# Patient Record
Sex: Male | Born: 1940 | Race: White | Hispanic: No | Smoking: Current every day smoker
Health system: Southern US, Community
[De-identification: ages and names within clinical notes are randomized; demographics above are authoritative.]

## PROBLEM LIST (undated history)

## (undated) DIAGNOSIS — M1990 Unspecified osteoarthritis, unspecified site: Secondary | ICD-10-CM

## (undated) DIAGNOSIS — F418 Other specified anxiety disorders: Secondary | ICD-10-CM

## (undated) DIAGNOSIS — R7303 Prediabetes: Secondary | ICD-10-CM

## (undated) DIAGNOSIS — N529 Male erectile dysfunction, unspecified: Secondary | ICD-10-CM

## (undated) DIAGNOSIS — Z720 Tobacco use: Secondary | ICD-10-CM

## (undated) DIAGNOSIS — L409 Psoriasis, unspecified: Secondary | ICD-10-CM

## (undated) DIAGNOSIS — M51369 Other intervertebral disc degeneration, lumbar region without mention of lumbar back pain or lower extremity pain: Secondary | ICD-10-CM

## (undated) DIAGNOSIS — E785 Hyperlipidemia, unspecified: Secondary | ICD-10-CM

## (undated) DIAGNOSIS — R0600 Dyspnea, unspecified: Secondary | ICD-10-CM

## (undated) DIAGNOSIS — J449 Chronic obstructive pulmonary disease, unspecified: Secondary | ICD-10-CM

## (undated) DIAGNOSIS — K2270 Barrett's esophagus without dysplasia: Secondary | ICD-10-CM

## (undated) DIAGNOSIS — I420 Dilated cardiomyopathy: Secondary | ICD-10-CM

## (undated) DIAGNOSIS — R0602 Shortness of breath: Secondary | ICD-10-CM

## (undated) DIAGNOSIS — F419 Anxiety disorder, unspecified: Secondary | ICD-10-CM

## (undated) DIAGNOSIS — R0609 Other forms of dyspnea: Secondary | ICD-10-CM

## (undated) DIAGNOSIS — K469 Unspecified abdominal hernia without obstruction or gangrene: Secondary | ICD-10-CM

## (undated) DIAGNOSIS — F32A Depression, unspecified: Secondary | ICD-10-CM

## (undated) DIAGNOSIS — I509 Heart failure, unspecified: Secondary | ICD-10-CM

## (undated) DIAGNOSIS — J431 Panlobular emphysema: Secondary | ICD-10-CM

## (undated) DIAGNOSIS — I5020 Unspecified systolic (congestive) heart failure: Secondary | ICD-10-CM

## (undated) DIAGNOSIS — K219 Gastro-esophageal reflux disease without esophagitis: Secondary | ICD-10-CM

## (undated) DIAGNOSIS — M5136 Other intervertebral disc degeneration, lumbar region: Secondary | ICD-10-CM

## (undated) DIAGNOSIS — F329 Major depressive disorder, single episode, unspecified: Secondary | ICD-10-CM

## (undated) DIAGNOSIS — I429 Cardiomyopathy, unspecified: Secondary | ICD-10-CM

## (undated) DIAGNOSIS — I10 Essential (primary) hypertension: Secondary | ICD-10-CM

## (undated) DIAGNOSIS — N400 Enlarged prostate without lower urinary tract symptoms: Secondary | ICD-10-CM

## (undated) HISTORY — PX: APPENDECTOMY: SHX54

## (undated) HISTORY — PX: CT RADIATION THERAPY GUIDE: HXRAD513

## (undated) HISTORY — DX: Anxiety disorder, unspecified: F41.9

## (undated) HISTORY — PX: JOINT REPLACEMENT: SHX530

## (undated) HISTORY — PX: HERNIA REPAIR: SHX51

## (undated) HISTORY — PX: COLONOSCOPY, ESOPHAGOGASTRODUODENOSCOPY (EGD) AND ESOPHAGEAL DILATION: SHX5781

## (undated) HISTORY — PX: OTHER SURGICAL HISTORY: SHX169

---

## 2006-01-04 ENCOUNTER — Ambulatory Visit: Payer: Self-pay | Admitting: Gastroenterology

## 2007-01-11 ENCOUNTER — Ambulatory Visit: Payer: Self-pay | Admitting: Gastroenterology

## 2008-04-11 ENCOUNTER — Ambulatory Visit: Payer: Self-pay | Admitting: Gastroenterology

## 2010-06-16 ENCOUNTER — Ambulatory Visit: Payer: Self-pay | Admitting: Gastroenterology

## 2014-07-02 DIAGNOSIS — Z87898 Personal history of other specified conditions: Secondary | ICD-10-CM | POA: Insufficient documentation

## 2014-08-15 ENCOUNTER — Ambulatory Visit: Payer: Self-pay | Admitting: Gastroenterology

## 2014-08-16 LAB — PATHOLOGY REPORT

## 2014-09-12 ENCOUNTER — Ambulatory Visit: Payer: Self-pay | Admitting: Gastroenterology

## 2014-11-14 ENCOUNTER — Ambulatory Visit: Payer: Self-pay | Admitting: Gastroenterology

## 2015-01-23 ENCOUNTER — Ambulatory Visit: Payer: Self-pay | Admitting: Gastroenterology

## 2015-03-20 ENCOUNTER — Ambulatory Visit: Payer: Medicare Other | Admitting: Anesthesiology

## 2015-03-20 ENCOUNTER — Ambulatory Visit
Admission: RE | Admit: 2015-03-20 | Discharge: 2015-03-20 | Disposition: A | Discharge status: Home / Self Care | Payer: Medicare Other | Source: Ambulatory Visit | Attending: Gastroenterology | Admitting: Gastroenterology

## 2015-03-20 ENCOUNTER — Encounter
Admission: RE | Disposition: A | Discharge status: Home / Self Care | Payer: Self-pay | Source: Ambulatory Visit | Attending: Gastroenterology

## 2015-03-20 DIAGNOSIS — J449 Chronic obstructive pulmonary disease, unspecified: Secondary | ICD-10-CM | POA: Diagnosis not present

## 2015-03-20 DIAGNOSIS — F17210 Nicotine dependence, cigarettes, uncomplicated: Secondary | ICD-10-CM | POA: Diagnosis not present

## 2015-03-20 DIAGNOSIS — Z79899 Other long term (current) drug therapy: Secondary | ICD-10-CM | POA: Diagnosis not present

## 2015-03-20 DIAGNOSIS — K2270 Barrett's esophagus without dysplasia: Secondary | ICD-10-CM | POA: Diagnosis present

## 2015-03-20 DIAGNOSIS — Z7951 Long term (current) use of inhaled steroids: Secondary | ICD-10-CM | POA: Diagnosis not present

## 2015-03-20 DIAGNOSIS — I10 Essential (primary) hypertension: Secondary | ICD-10-CM | POA: Diagnosis not present

## 2015-03-20 HISTORY — DX: Essential (primary) hypertension: I10

## 2015-03-20 HISTORY — DX: Chronic obstructive pulmonary disease, unspecified: J44.9

## 2015-03-20 HISTORY — PX: ESOPHAGOGASTRODUODENOSCOPY: SHX5428

## 2015-03-20 SURGERY — EGD (ESOPHAGOGASTRODUODENOSCOPY)
Anesthesia: General

## 2015-03-20 MED ORDER — GLYCOPYRROLATE 0.2 MG/ML IJ SOLN
INTRAMUSCULAR | Status: DC | PRN
  Administered 2015-03-20: 0.2 mg via INTRAVENOUS

## 2015-03-20 MED ORDER — PROPOFOL INFUSION 10 MG/ML OPTIME
INTRAVENOUS | Status: DC | PRN
  Administered 2015-03-20: 250 ug/kg/min via INTRAVENOUS

## 2015-03-20 MED ORDER — PROPOFOL 10 MG/ML IV BOLUS
INTRAVENOUS | Status: DC | PRN
  Administered 2015-03-20: 30 mg via INTRAVENOUS
  Administered 2015-03-20: 50 mg via INTRAVENOUS

## 2015-03-20 MED ORDER — SODIUM CHLORIDE 0.9 % IV SOLN
INTRAVENOUS | Status: DC
  Administered 2015-03-20: 1000 mL via INTRAVENOUS

## 2015-03-20 MED ORDER — LIDOCAINE HCL (CARDIAC) 20 MG/ML IV SOLN
INTRAVENOUS | Status: DC | PRN
  Administered 2015-03-20: 60 mg via INTRAVENOUS

## 2015-03-20 MED ORDER — ACETYLCYSTEINE 20 % IN SOLN
3.0000 mL | Freq: Once | RESPIRATORY_TRACT | Status: DC
  Filled 2015-03-20: qty 4

## 2015-03-20 MED ORDER — STERILE WATER FOR IRRIGATION IR SOLN
Freq: Once | RESPIRATORY_TRACT | Status: AC
  Administered 2015-03-20: 08:00:00
  Filled 2015-03-20: qty 57

## 2015-03-20 NOTE — H&P (Addendum)
Primary Care Physician:  Idelle Crouch, MD Primary Gastroenterologist:  Dr. Candace Cruise  Pre-Procedure History & Physical: HPI:  Mathew Reyes is a 74 y.o. male is here for an EGD with bx. Hx of long segment Barrett's. Barrx x 3.   Past Medical History  Diagnosis Date  . COPD (chronic obstructive pulmonary disease)   . Hypertension     Past Surgical History  Procedure Laterality Date  . Appendectomy    . Hernia repair      Prior to Admission medications   Medication Sig Start Date End Date Taking? Authorizing Provider  budesonide-formoterol (SYMBICORT) 160-4.5 MCG/ACT inhaler Inhale 2 puffs into the lungs 2 (two) times daily.   Yes Historical Provider, MD  carvedilol (COREG) 3.125 MG tablet Take 3.125 mg by mouth 2 (two) times daily with a meal.   Yes Historical Provider, MD  digoxin (LANOXIN) 0.125 MG tablet Take 125 mcg by mouth daily. 02/12/15  Yes Historical Provider, MD  esomeprazole (NEXIUM) 20 MG capsule Take 20 mg by mouth daily at 12 noon.   Yes Historical Provider, MD  FLUoxetine (PROZAC) 10 MG capsule Take 10 mg by mouth daily.   Yes Historical Provider, MD  lisinopril (PRINIVIL,ZESTRIL) 5 MG tablet Take 5 mg by mouth daily.   Yes Historical Provider, MD  Multiple Vitamins-Minerals (MULTIVITAMIN & MINERAL PO) Take 1 tablet by mouth daily.   Yes Historical Provider, MD  Omega-3 Fatty Acids (FISH OIL PO) Take 1 tablet by mouth daily.   Yes Historical Provider, MD  POTASSIUM PO Take 1 tablet by mouth daily.   Yes Historical Provider, MD  sildenafil (VIAGRA) 100 MG tablet Take 100 mg by mouth daily as needed for erectile dysfunction.   Yes Historical Provider, MD  TACLONEX external suspension Apply 1 application topically daily. 02/24/15  Yes Historical Provider, MD  XOPENEX HFA 45 MCG/ACT inhaler Inhale 1-2 puffs into the lungs every 6 (six) hours as needed for wheezing or shortness of breath.  12/17/14  Yes Historical Provider, MD    Allergies as of 03/07/2015  . (Not on  File)    History reviewed. No pertinent family history.  History   Social History  . Marital Status: Married    Spouse Name: N/A  . Number of Children: N/A  . Years of Education: N/A   Occupational History  . Not on file.   Social History Main Topics  . Smoking status: Current Every Day Smoker -- 1.50 packs/day    Types: Cigarettes  . Smokeless tobacco: Not on file  . Alcohol Use: Not on file  . Drug Use: Not on file  . Sexual Activity: Not on file   Other Topics Concern  . Not on file   Social History Narrative  . No narrative on file    Review of Systems: See HPI, otherwise negative ROS  Physical Exam: BP 155/91 mmHg  Pulse 61  Temp(Src) 96.9 F (36.1 C) (Tympanic)  Resp 18  Ht 5\' 7"  (1.702 m)  Wt 75.297 kg (166 lb)  BMI 25.99 kg/m2  SpO2 96% General:   Alert,  pleasant and cooperative in NAD Head:  Normocephalic and atraumatic. Neck:  Supple; no masses or thyromegaly. Lungs:  Clear throughout to auscultation.    Heart:  Regular rate and rhythm. Abdomen:  Soft, nontender and nondistended. Normal bowel sounds, without guarding, and without rebound.   Neurologic:  Alert and  oriented x4;  grossly normal neurologically.  Impression/Plan: Mathew Reyes is here for an endoscopy  to be performed for EGD with bx.  Risks, benefits, limitations, and alternatives regarding  endoscopy have been reviewed with the patient.  Questions have been answered.  All parties agreeable.   Mathew Reyes, Mathew Dawn, MD  03/20/2015, 8:00 AM

## 2015-03-20 NOTE — Anesthesia Procedure Notes (Signed)
Performed by: Aline Brochure Oxygen Delivery Method: Nasal cannula

## 2015-03-20 NOTE — Anesthesia Preprocedure Evaluation (Signed)
Anesthesia Evaluation  Patient identified by MRN, date of birth, ID band Patient awake    Reviewed: Allergy & Precautions, H&P , NPO status , Patient's Chart, lab work & pertinent test results, reviewed documented beta blocker date and time   Airway Mallampati: II  TM Distance: >3 FB Neck ROM: full    Dental no notable dental hx.    Pulmonary neg pulmonary ROS, COPDCurrent Smoker,  breath sounds clear to auscultation  Pulmonary exam normal       Cardiovascular Exercise Tolerance: Good hypertension, negative cardio ROS  Rhythm:regular Rate:Normal     Neuro/Psych negative neurological ROS  negative psych ROS   GI/Hepatic negative GI ROS, Neg liver ROS,   Endo/Other  negative endocrine ROS  Renal/GU negative Renal ROS  negative genitourinary   Musculoskeletal   Abdominal   Peds  Hematology negative hematology ROS (+)   Anesthesia Other Findings   Reproductive/Obstetrics negative OB ROS                             Anesthesia Physical Anesthesia Plan  ASA: II  Anesthesia Plan: General   Post-op Pain Management:    Induction:   Airway Management Planned:   Additional Equipment:   Intra-op Plan:   Post-operative Plan:   Informed Consent: I have reviewed the patients History and Physical, chart, labs and discussed the procedure including the risks, benefits and alternatives for the proposed anesthesia with the patient or authorized representative who has indicated his/her understanding and acceptance.   Dental Advisory Given  Plan Discussed with: CRNA  Anesthesia Plan Comments:         Anesthesia Quick Evaluation

## 2015-03-20 NOTE — Anesthesia Postprocedure Evaluation (Signed)
  Anesthesia Post-op Note  Patient: Mathew Reyes  Procedure(s) Performed: Procedure(s): ESOPHAGOGASTRODUODENOSCOPY (EGD) (N/A)  Anesthesia type:General  Patient location: PACU  Post pain: Pain level controlled  Post assessment: Post-op Vital signs reviewed, Patient's Cardiovascular Status Stable, Respiratory Function Stable, Patent Airway and No signs of Nausea or vomiting  Post vital signs: Reviewed and stable  Last Vitals:  Filed Vitals:   03/20/15 0930  BP: 146/89  Pulse: 73  Temp:   Resp: 20    Level of consciousness: awake, alert  and patient cooperative  Complications: No apparent anesthesia complications

## 2015-03-20 NOTE — Transfer of Care (Signed)
Immediate Anesthesia Transfer of Care Note  Patient: LAWYER WASH[facility]AU[facility]  Procedure(s) Performed: Procedure(s): ESOPHAGOGASTRODUODENOSCOPY (EGD) (N/A)  Patient Location: Endoscopy Unit  Anesthesia Type:General  Level of Consciousness: awake and alert   Airway & Oxygen Therapy: Patient Spontanous Breathing and Patient connected to nasal cannula oxygen  Post-op Assessment: Report given to RN and Post -op Vital signs reviewed and stable  Post vital signs: stable  Last Vitals:  Filed Vitals:   03/20/15 0900  BP: 122/71  Pulse: 65  Temp: 36.2 C  Resp: 20    Complications: No apparent anesthesia complications

## 2015-03-20 NOTE — Op Note (Signed)
[facility] Patient Name: Mathew Reyes Procedure Date: 03/20/2015 7:50 AM MRN: [id number] Account #: [id number] Date of Birth: 1941-03-23 Admit Type: Outpatient Age: 74 Room: [facility] ENDO ROOM 4 Gender: Male Note Status: Finalized Procedure:         Upper GI endoscopy Indications:       Follow-up of Barrett's esophagus, S/P Barrx x 3. Providers:         Lupita Dawn. Candace Cruise, MD Referring MD:      Leonie Douglas. Doy Hutching, MD (Referring MD) Medicines:         Monitored Anesthesia Care Complications:     No immediate complications. Procedure:         Pre-Anesthesia Assessment:                    - Prior to the procedure, a History and Physical was                     performed, and patient medications, allergies and                     sensitivities were reviewed. The patient's tolerance of                     previous anesthesia was reviewed.                    - The risks and benefits of the procedure and the sedation                     options and risks were discussed with the patient. All                     questions were answered and informed consent was obtained.                    - After reviewing the risks and benefits, the patient was                     deemed in satisfactory condition to undergo the procedure.                    After obtaining informed consent, the endoscope was passed                     under direct vision. Throughout the procedure, the                     patient's blood pressure, pulse, and oxygen saturations                     were monitored continuously. The Olympus GIF-160 endoscope                     (S#. [id number]) was introduced through the mouth, and                     advanced to the second part of duodenum. The upper GI                     endoscopy was accomplished without difficulty. The patient                     tolerated the procedure well. Findings:      Circumferential salmon-colored mucosa was present  at 40 cm.  Area not       seen under regular imaging. With NBI, possible Barrett's at few spots at       GE junction.No other visible abnormalities were present. Mucosa was       biopsied with a cold forceps for histology. One specimen bottle was sent       to pathology.      The exam of the esophagus was otherwise normal.      The entire examined stomach was normal.      The examined duodenum was normal. Impression:        - Salmon-colored mucosa. Biopsied.                    - Normal stomach.                    - Normal examined duodenum. Recommendation:    - Discharge patient to home.                    - Observe patient's clinical course.                    - Continue present medications.                    - Await pathology results.                    - The findings and recommendations were discussed with the                     patient.                    - If bx still show Barrett's, then repeat Barrx next time                     to completely eradicate Barrett's. Procedure Code(s): --- Professional ---                    [phone], Esophagogastroduodenoscopy, flexible, transoral;                     with biopsy, single or multiple Diagnosis Code(s): --- Professional ---                    K22.70, Barrett's esophagus without dysplasia CPT copyright 2014 American Medical Association. All rights reserved. The codes documented in this report are preliminary and upon coder review may  be revised to meet current compliance requirements. Hulen Luster, MD 03/20/2015 8:55:58 AM This report has been signed electronically. Number of Addenda: 0 Note Initiated On: 03/20/2015 7:50 AM      [facility]

## 2015-03-21 ENCOUNTER — Encounter: Payer: Self-pay | Admitting: Gastroenterology

## 2015-03-21 LAB — SURGICAL PATHOLOGY

## 2015-05-15 ENCOUNTER — Ambulatory Visit: Admission: RE | Admit: 2015-05-15 | Payer: Medicare Other | Source: Ambulatory Visit | Admitting: Gastroenterology

## 2015-05-15 ENCOUNTER — Encounter: Admission: RE | Payer: Self-pay | Source: Ambulatory Visit

## 2015-05-15 SURGERY — ESOPHAGOGASTRODUODENOSCOPY (EGD) WITH PROPOFOL
Anesthesia: General

## 2015-08-27 ENCOUNTER — Encounter: Payer: Self-pay | Admitting: *Deleted

## 2015-08-28 ENCOUNTER — Ambulatory Visit: Payer: Medicare Other | Admitting: Certified Registered Nurse Anesthetist

## 2015-08-28 ENCOUNTER — Ambulatory Visit
Admission: RE | Admit: 2015-08-28 | Discharge: 2015-08-28 | Disposition: A | Discharge status: Home / Self Care | Payer: Medicare Other | Source: Ambulatory Visit | Attending: Gastroenterology | Admitting: Gastroenterology

## 2015-08-28 ENCOUNTER — Encounter
Admission: RE | Disposition: A | Discharge status: Home / Self Care | Payer: Self-pay | Source: Ambulatory Visit | Attending: Gastroenterology

## 2015-08-28 ENCOUNTER — Encounter: Payer: Self-pay | Admitting: *Deleted

## 2015-08-28 DIAGNOSIS — I429 Cardiomyopathy, unspecified: Secondary | ICD-10-CM | POA: Diagnosis not present

## 2015-08-28 DIAGNOSIS — I110 Hypertensive heart disease with heart failure: Secondary | ICD-10-CM | POA: Diagnosis not present

## 2015-08-28 DIAGNOSIS — N400 Enlarged prostate without lower urinary tract symptoms: Secondary | ICD-10-CM | POA: Insufficient documentation

## 2015-08-28 DIAGNOSIS — J449 Chronic obstructive pulmonary disease, unspecified: Secondary | ICD-10-CM | POA: Insufficient documentation

## 2015-08-28 DIAGNOSIS — E785 Hyperlipidemia, unspecified: Secondary | ICD-10-CM | POA: Insufficient documentation

## 2015-08-28 DIAGNOSIS — F17210 Nicotine dependence, cigarettes, uncomplicated: Secondary | ICD-10-CM | POA: Insufficient documentation

## 2015-08-28 DIAGNOSIS — F329 Major depressive disorder, single episode, unspecified: Secondary | ICD-10-CM | POA: Diagnosis not present

## 2015-08-28 DIAGNOSIS — Z79899 Other long term (current) drug therapy: Secondary | ICD-10-CM | POA: Diagnosis not present

## 2015-08-28 DIAGNOSIS — K219 Gastro-esophageal reflux disease without esophagitis: Secondary | ICD-10-CM | POA: Insufficient documentation

## 2015-08-28 DIAGNOSIS — I509 Heart failure, unspecified: Secondary | ICD-10-CM | POA: Insufficient documentation

## 2015-08-28 DIAGNOSIS — M1990 Unspecified osteoarthritis, unspecified site: Secondary | ICD-10-CM | POA: Insufficient documentation

## 2015-08-28 DIAGNOSIS — K2270 Barrett's esophagus without dysplasia: Secondary | ICD-10-CM | POA: Insufficient documentation

## 2015-08-28 DIAGNOSIS — L409 Psoriasis, unspecified: Secondary | ICD-10-CM | POA: Insufficient documentation

## 2015-08-28 HISTORY — DX: Other specified anxiety disorders: F41.8

## 2015-08-28 HISTORY — PX: ESOPHAGOGASTRODUODENOSCOPY: SHX5428

## 2015-08-28 HISTORY — DX: Major depressive disorder, single episode, unspecified: F32.9

## 2015-08-28 HISTORY — DX: Depression, unspecified: F32.A

## 2015-08-28 HISTORY — DX: Unspecified osteoarthritis, unspecified site: M19.90

## 2015-08-28 HISTORY — DX: Heart failure, unspecified: I50.9

## 2015-08-28 HISTORY — DX: Shortness of breath: R06.02

## 2015-08-28 HISTORY — DX: Gastro-esophageal reflux disease without esophagitis: K21.9

## 2015-08-28 HISTORY — DX: Hyperlipidemia, unspecified: E78.5

## 2015-08-28 HISTORY — DX: Tobacco use: Z72.0

## 2015-08-28 HISTORY — DX: Cardiomyopathy, unspecified: I42.9

## 2015-08-28 HISTORY — DX: Benign prostatic hyperplasia without lower urinary tract symptoms: N40.0

## 2015-08-28 HISTORY — DX: Dyspnea, unspecified: R06.00

## 2015-08-28 HISTORY — DX: Unspecified abdominal hernia without obstruction or gangrene: K46.9

## 2015-08-28 HISTORY — DX: Psoriasis, unspecified: L40.9

## 2015-08-28 HISTORY — DX: Other forms of dyspnea: R06.09

## 2015-08-28 SURGERY — EGD (ESOPHAGOGASTRODUODENOSCOPY)
Anesthesia: General

## 2015-08-28 MED ORDER — SODIUM CHLORIDE 0.9 % IV SOLN
INTRAVENOUS | Status: DC
  Administered 2015-08-28: 08:00:00 via INTRAVENOUS

## 2015-08-28 MED ORDER — SODIUM CHLORIDE 0.9 % IV SOLN: INTRAVENOUS | Status: DC

## 2015-08-28 MED ORDER — IPRATROPIUM-ALBUTEROL 0.5-2.5 (3) MG/3ML IN SOLN
3.0000 mL | Freq: Once | RESPIRATORY_TRACT | Status: AC
  Administered 2015-08-28: 3 mL via RESPIRATORY_TRACT

## 2015-08-28 MED ORDER — SODIUM CHLORIDE 0.9 % IV SOLN
INTRAVENOUS | Status: DC
Start: 2015-08-28 — End: 2015-08-28

## 2015-08-28 MED ORDER — ACETYLCYSTEINE 20 % IN SOLN
Freq: Once | RESPIRATORY_TRACT | Status: DC
  Filled 2015-08-28: qty 3

## 2015-08-28 MED ORDER — PROPOFOL 500 MG/50ML IV EMUL
INTRAVENOUS | Status: DC | PRN
  Administered 2015-08-28: 160 ug/kg/min via INTRAVENOUS

## 2015-08-28 MED ORDER — PROPOFOL 10 MG/ML IV BOLUS
INTRAVENOUS | Status: DC | PRN
  Administered 2015-08-28: 50 mg via INTRAVENOUS
  Administered 2015-08-28: 20 mg via INTRAVENOUS

## 2015-08-28 MED ORDER — IPRATROPIUM-ALBUTEROL 0.5-2.5 (3) MG/3ML IN SOLN
RESPIRATORY_TRACT | Status: AC
  Filled 2015-08-28: qty 3

## 2015-08-28 MED ORDER — LIDOCAINE HCL (CARDIAC) 20 MG/ML IV SOLN
INTRAVENOUS | Status: DC | PRN
  Administered 2015-08-28: 100 mg via INTRAVENOUS

## 2015-08-28 NOTE — H&P (Signed)
Primary Care Physician:  Idelle Crouch, MD Primary Gastroenterologist:  Dr. Candace Cruise  Pre-Procedure History & Physical: HPI:  Mathew Reyes is a 74 y.o. male is here for an EGD with Barrx..   Past Medical History  Diagnosis Date  . COPD (chronic obstructive pulmonary disease) ([place])   . Hypertension   . GERD (gastroesophageal reflux disease)   . Arthritis   . Tobacco abuse   . Psoriasis   . Cardiomyopathy ([place])   . CHF (congestive heart failure) ([place])   . Situational anxiety   . SOB (shortness of breath)   . DOE (dyspnea on exertion)   . BPH (benign prostatic hyperplasia)   . Depression   . Abdominal hernia   . Lipidemia     Past Surgical History  Procedure Laterality Date  . Appendectomy    . Hernia repair    . Esophagogastroduodenoscopy N/A 03/20/2015    Procedure: ESOPHAGOGASTRODUODENOSCOPY (EGD);  Surgeon: Hulen Luster, MD;  Location: [facility] ENDOSCOPY;  Service: Gastroenterology;  Laterality: N/A;  . Hemmorrhoidectomy    . Throid polyp    . Colonoscopy, esophagogastroduodenoscopy (egd) and esophageal dilation      Prior to Admission medications   Medication Sig Start Date End Date Taking? Authorizing Provider  budesonide-formoterol (SYMBICORT) 160-4.5 MCG/ACT inhaler Inhale 2 puffs into the lungs 2 (two) times daily.   Yes Historical Provider, MD  carvedilol (COREG) 3.125 MG tablet Take 3.125 mg by mouth 2 (two) times daily with a meal.   Yes Historical Provider, MD  digoxin (LANOXIN) 0.125 MG tablet Take 125 mcg by mouth daily. 02/12/15  Yes Historical Provider, MD  esomeprazole (NEXIUM) 20 MG capsule Take 20 mg by mouth daily at 12 noon.   Yes Historical Provider, MD  FLUoxetine (PROZAC) 10 MG capsule Take 10 mg by mouth daily.   Yes Historical Provider, MD  lisinopril (PRINIVIL,ZESTRIL) 5 MG tablet Take 5 mg by mouth daily.   Yes Historical Provider, MD  Multiple Vitamins-Minerals (MULTIVITAMIN & MINERAL PO) Take 1 tablet by mouth daily.   Yes Historical Provider, MD   Omega-3 Fatty Acids (FISH OIL PO) Take 1 tablet by mouth daily.   Yes Historical Provider, MD  POTASSIUM PO Take 1 tablet by mouth daily.   Yes Historical Provider, MD  TACLONEX external suspension Apply 1 application topically daily. 02/24/15  Yes Historical Provider, MD  XOPENEX HFA 45 MCG/ACT inhaler Inhale 1-2 puffs into the lungs every 6 (six) hours as needed for wheezing or shortness of breath.  12/17/14  Yes Historical Provider, MD  sildenafil (VIAGRA) 100 MG tablet Take 100 mg by mouth daily as needed for erectile dysfunction.    Historical Provider, MD    Allergies as of 08/25/2015  . (No Known Allergies)    History reviewed. No pertinent family history.  Social History   Social History  . Marital Status: Married    Spouse Name: N/A  . Number of Children: N/A  . Years of Education: N/A   Occupational History  . Not on file.   Social History Main Topics  . Smoking status: Current Every Day Smoker -- 1.50 packs/day    Types: Cigarettes  . Smokeless tobacco: Not on file  . Alcohol Use: No  . Drug Use: No  . Sexual Activity: Not on file   Other Topics Concern  . Not on file   Social History Narrative    Review of Systems: See HPI, otherwise negative ROS  Physical Exam: BP 158/94 mmHg  Pulse  58  Temp(Src) 96.7 F (35.9 C) (Tympanic)  Resp 14  Ht 5\' 7"  (1.702 m)  Wt 78.019 kg (172 lb)  BMI 26.93 kg/m2  SpO2 97% General:   Alert,  pleasant and cooperative in NAD Head:  Normocephalic and atraumatic. Neck:  Supple; no masses or thyromegaly. Lungs:  Clear throughout to auscultation.    Heart:  Regular rate and rhythm. Abdomen:  Soft, nontender and nondistended. Normal bowel sounds, without guarding, and without rebound.   Neurologic:  Alert and  oriented x4;  grossly normal neurologically.  Impression/Plan: Mathew Reyes is here for an EGD with Barrx to be performed for Barrett's Risks, benefits, limitations, and alternatives regarding EGD with Barrx  have been reviewed with the patient.  Questions have been answered.  All parties agreeable.   Emmanuela Ghazi, Lupita Dawn, MD  08/28/2015, 8:07 AM

## 2015-08-28 NOTE — Anesthesia Preprocedure Evaluation (Addendum)
Anesthesia Evaluation  Patient identified by MRN, date of birth, ID band Patient awake    Reviewed: Allergy & Precautions, H&P , NPO status , Patient's Chart, lab work & pertinent test results  History of Anesthesia Complications Negative for: history of anesthetic complications  Airway Mallampati: III  TM Distance: >3 FB Neck ROM: limited    Dental  (+) Poor Dentition, Chipped   Pulmonary shortness of breath, COPD, Current Smoker,    Pulmonary exam normal breath sounds clear to auscultation       Cardiovascular Exercise Tolerance: Good hypertension, (-) angina+CHF  (-) Past MI and (-) DOE Normal cardiovascular exam Rhythm:regular Rate:Normal     Neuro/Psych PSYCHIATRIC DISORDERS negative neurological ROS     GI/Hepatic Neg liver ROS, GERD  Controlled and Medicated,  Endo/Other  negative endocrine ROS  Renal/GU negative Renal ROS  negative genitourinary   Musculoskeletal  (+) Arthritis ,   Abdominal   Peds  Hematology negative hematology ROS (+)   Anesthesia Other Findings Past Medical History:   COPD (chronic obstructive pulmonary disease) (*              Hypertension                                                 GERD (gastroesophageal reflux disease)                       Arthritis                                                    Tobacco abuse                                                Psoriasis                                                    Cardiomyopathy (HCC)                                         CHF (congestive heart failure) (HCC)                         Situational anxiety                                          SOB (shortness of breath)                                    DOE (dyspnea on exertion)  BPH (benign prostatic hyperplasia)                           Depression                                                   Abdominal hernia                                              Lipidemia                                                   Past Surgical History:   APPENDECTOMY                                                  HERNIA REPAIR                                                 ESOPHAGOGASTRODUODENOSCOPY                      N/A 03/20/2015      Comment:Procedure: ESOPHAGOGASTRODUODENOSCOPY (EGD);                Surgeon: Hulen Luster, MD;  Location: [facility]               ENDOSCOPY;  Service: Gastroenterology;                Laterality: N/A;   Hemmorrhoidectomy                                             Throid polyp                                                  COLONOSCOPY, ESOPHAGOGASTRODUODENOSCOPY (EGD) *              BMI    Body Mass Index   26.93 kg/m 2      Reproductive/Obstetrics negative OB ROS                             Anesthesia Physical Anesthesia Plan  ASA: III  Anesthesia Plan: General   Post-op Pain Management:    Induction:   Airway Management Planned:   Additional Equipment:   Intra-op Plan:   Post-operative Plan:   Informed Consent: I have reviewed the patients History and Physical, chart, labs and discussed the procedure including the risks, benefits and alternatives for  the proposed anesthesia with the patient or authorized representative who has indicated his/her understanding and acceptance.   Dental Advisory Given  Plan Discussed with: Anesthesiologist, CRNA and Surgeon  Anesthesia Plan Comments:         Anesthesia Quick Evaluation

## 2015-08-28 NOTE — Op Note (Signed)
[facility]astroenterology Patient Name: Mathew Reyes Procedure Date: 08/28/2015 9:02 AM MRN: [id number] Account #: [id number] Date of Birth: 1941-05-27 Admit Type: Outpatient Age: 74 Room: [facility] ENDO ROOM 4 Gender: Male Note Status: Finalized Procedure:         Upper GI endoscopy Indications:       For therapy of Barrett's esophagus, Hx of long segment                     Barrett's. S/P Barrx x 4. Providers:         Lupita Dawn. Candace Cruise, MD Referring MD:      Leonie Douglas. Doy Hutching, MD (Referring MD) Medicines:         Monitored Anesthesia Care Complications:     No immediate complications. Procedure:         Pre-Anesthesia Assessment:                    - Prior to the procedure, a History and Physical was                     performed, and patient medications, allergies and                     sensitivities were reviewed. The patient's tolerance of                     previous anesthesia was reviewed.                    - The risks and benefits of the procedure and the sedation                     options and risks were discussed with the patient. All                     questions were answered and informed consent was obtained.                    - After reviewing the risks and benefits, the patient was                     deemed in satisfactory condition to undergo the procedure.                    After obtaining informed consent, the endoscope was passed                     under direct vision. Throughout the procedure, the                     patient's blood pressure, pulse, and oxygen saturations                     were monitored continuously. The Endoscope was introduced                     through the mouth, and advanced to the second part of                     duodenum. The upper GI endoscopy was accomplished without                     difficulty. The patient tolerated the procedure well. Findings:      There were esophageal  mucosal changes consistent with  short-segment       Barrett's esophagus present at the gastroesophageal junction. The       maximum longitudinal extent of these mucosal changes was 0.5 cm in       length. Focal radiofrequency ablation of Barrett's esophagus was       performed. With the endoscope in place, the position and extent of the       Barrett's mucosa and the anatomic landmarks including proximal and       distal extent of Barrett's mucosa were noted. The Barrett's mucosa was       irrigated with N-acetylcysteine (Mucomyst) 1% mixed with water.       Esophageal contents were suctioned. The endoscope was then removed from       the patient. The Halo-60 radiofrequency ablation catheter was attached       to the tip of the endoscope. The endoscope with the attached       radiofrequency ablation catheter was then passed transorally under       direct vision into the esophagus and advanced to the areas of Barrett's       mucosa. The areas included islands and circumferential areas of       Barrett's mucosa. The radiofrequency ablation catheter was placed in       contact with the surface of the Barrett's mucosa under direct       visualization and energy was applied twice at 12 J/cm2. Ablation was       repeated in a likewise fashion to the entire area of suspected Barrett's       mucosa. The ablation zone was cleaned of coagulative debris. The areas       of the esophagus where Barrett's mucosa had been ablated were carefully       examined. Areas of Barrett's esophagus were completely treated. Total of       18 treatments given.      The exam was otherwise without abnormality.      The entire examined stomach was normal.      The examined duodenum was normal. 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 Treated with radiofrequency ablation.                    - The examination was otherwise normal.                    - Normal stomach.                     - Normal examined duodenum.                    - No specimens collected. Recommendation:    - Discharge patient to home.                    - Observe patient's clinical course.                    - Continue present medications.                    - The findings and recommendations were discussed with the  patient.                    - GI cocktail and Tylenol with codeine elixir for pain                     control. Repeat EGD in several months to confirm                     eradication of Barrett's. Procedure Code(s): --- Professional ---                    [phone], Esophagogastroduodenoscopy, flexible, transoral;                     with ablation of tumor(s), polyp(s), or other lesion(s)                     (includes pre- and post-dilation and guide wire passage,                     when performed) Diagnosis Code(s): --- Professional ---                    K22.70, Barrett's esophagus without dysplasia CPT copyright 2014 American Medical Association. All rights reserved. The codes documented in this report are preliminary and upon coder review may  be revised to meet current compliance requirements. Hulen Luster, MD 08/28/2015 9:22:36 AM This report has been signed electronically. Number of Addenda: 0 Note Initiated On: 08/28/2015 9:02 AM      [facility]

## 2015-08-28 NOTE — Anesthesia Postprocedure Evaluation (Signed)
  Anesthesia Post-op Note  Patient: Mathew Reyes  Procedure(s) Performed: Procedure(s): ESOPHAGOGASTRODUODENOSCOPY (EGD) (N/A)  Anesthesia type:General  Patient location: PACU  Post pain: Pain level controlled  Post assessment: Post-op Vital signs reviewed, Patient's Cardiovascular Status Stable, Respiratory Function Stable, Patent Airway and No signs of Nausea or vomiting  Post vital signs: Reviewed and stable  Last Vitals:  Filed Vitals:   08/28/15 0950  BP: 165/102  Pulse: 57  Temp:   Resp: 18    Level of consciousness: awake, alert  and patient cooperative  Complications: No apparent anesthesia complications

## 2015-08-28 NOTE — Anesthesia Procedure Notes (Signed)
Performed by: Johnna Acosta Pre-anesthesia Checklist: Patient identified Patient Re-evaluated:Patient Re-evaluated prior to inductionOxygen Delivery Method: Nasal cannula Placement Confirmation: positive ETCO2

## 2015-08-28 NOTE — Transfer of Care (Signed)
Immediate Anesthesia Transfer of Care Note  Patient: Mathew Reyes  Procedure(s) Performed: Procedure(s): ESOPHAGOGASTRODUODENOSCOPY (EGD) (N/A)  Patient Location: PACU  Anesthesia Type:General  Level of Consciousness: awake, alert  and oriented  Airway & Oxygen Therapy: Patient Spontanous Breathing  Post-op Assessment: Report given to RN  Post vital signs: Reviewed  Last Vitals:  Filed Vitals:   08/28/15 0922  BP: 137/96  Pulse: 66  Temp: 35.6 C  Resp: 18    Complications: No apparent anesthesia complications

## 2015-08-29 ENCOUNTER — Encounter: Payer: Self-pay | Admitting: Gastroenterology

## 2015-10-22 ENCOUNTER — Emergency Department: Payer: Medicare Other

## 2015-10-22 ENCOUNTER — Observation Stay
Admit: 2015-10-22 | Discharge: 2015-10-22 | Disposition: A | Discharge status: Home / Self Care | Payer: Medicare Other | Attending: Specialist | Admitting: Specialist

## 2015-10-22 ENCOUNTER — Observation Stay
Admission: EM | Admit: 2015-10-22 | Discharge: 2015-10-23 | Disposition: A | Discharge status: Home / Self Care | Payer: Medicare Other | Attending: Internal Medicine | Admitting: Internal Medicine

## 2015-10-22 DIAGNOSIS — M1990 Unspecified osteoarthritis, unspecified site: Secondary | ICD-10-CM | POA: Insufficient documentation

## 2015-10-22 DIAGNOSIS — L409 Psoriasis, unspecified: Secondary | ICD-10-CM | POA: Insufficient documentation

## 2015-10-22 DIAGNOSIS — I429 Cardiomyopathy, unspecified: Secondary | ICD-10-CM | POA: Insufficient documentation

## 2015-10-22 DIAGNOSIS — J449 Chronic obstructive pulmonary disease, unspecified: Secondary | ICD-10-CM | POA: Insufficient documentation

## 2015-10-22 DIAGNOSIS — N400 Enlarged prostate without lower urinary tract symptoms: Secondary | ICD-10-CM | POA: Diagnosis not present

## 2015-10-22 DIAGNOSIS — F17210 Nicotine dependence, cigarettes, uncomplicated: Secondary | ICD-10-CM | POA: Insufficient documentation

## 2015-10-22 DIAGNOSIS — I351 Nonrheumatic aortic (valve) insufficiency: Secondary | ICD-10-CM | POA: Insufficient documentation

## 2015-10-22 DIAGNOSIS — F329 Major depressive disorder, single episode, unspecified: Secondary | ICD-10-CM | POA: Insufficient documentation

## 2015-10-22 DIAGNOSIS — Z79899 Other long term (current) drug therapy: Secondary | ICD-10-CM | POA: Diagnosis not present

## 2015-10-22 DIAGNOSIS — I447 Left bundle-branch block, unspecified: Secondary | ICD-10-CM | POA: Insufficient documentation

## 2015-10-22 DIAGNOSIS — I340 Nonrheumatic mitral (valve) insufficiency: Secondary | ICD-10-CM | POA: Insufficient documentation

## 2015-10-22 DIAGNOSIS — Z820 Family history of epilepsy and other diseases of the nervous system: Secondary | ICD-10-CM | POA: Diagnosis not present

## 2015-10-22 DIAGNOSIS — R9431 Abnormal electrocardiogram [ECG] [EKG]: Secondary | ICD-10-CM | POA: Diagnosis not present

## 2015-10-22 DIAGNOSIS — R0600 Dyspnea, unspecified: Secondary | ICD-10-CM | POA: Diagnosis not present

## 2015-10-22 DIAGNOSIS — K219 Gastro-esophageal reflux disease without esophagitis: Secondary | ICD-10-CM | POA: Diagnosis not present

## 2015-10-22 DIAGNOSIS — Z8249 Family history of ischemic heart disease and other diseases of the circulatory system: Secondary | ICD-10-CM | POA: Diagnosis not present

## 2015-10-22 DIAGNOSIS — R0602 Shortness of breath: Principal | ICD-10-CM | POA: Diagnosis present

## 2015-10-22 DIAGNOSIS — F430 Acute stress reaction: Secondary | ICD-10-CM | POA: Diagnosis not present

## 2015-10-22 DIAGNOSIS — I509 Heart failure, unspecified: Secondary | ICD-10-CM | POA: Insufficient documentation

## 2015-10-22 DIAGNOSIS — I10 Essential (primary) hypertension: Secondary | ICD-10-CM | POA: Insufficient documentation

## 2015-10-22 DIAGNOSIS — E785 Hyperlipidemia, unspecified: Secondary | ICD-10-CM | POA: Diagnosis not present

## 2015-10-22 DIAGNOSIS — Z7982 Long term (current) use of aspirin: Secondary | ICD-10-CM | POA: Diagnosis not present

## 2015-10-22 LAB — TROPONIN I: Troponin I: <0.03 ng/mL (ref <0.031)

## 2015-10-22 LAB — CBC
HEMATOCRIT: 43.8 % (ref 40.0–52.0)
HEMOGLOBIN: 14.6 g/dL (ref 13.0–18.0)
MCH: 30.7 pg (ref 26.0–34.0)
MCHC: 33.3 g/dL (ref 32.0–36.0)
MCV: 92.4 fL (ref 80.0–100.0)
PLATELETS: 195 10*3/uL (ref 150–440)
RBC: 4.73 MIL/uL (ref 4.40–5.90)
RDW: 13.7 % (ref 11.5–14.5)
WBC: 10.4 10*3/uL (ref 3.8–10.6)

## 2015-10-22 LAB — BASIC METABOLIC PANEL
Anion gap: 7 (ref 5–15)
BUN: 17 mg/dL (ref 6–20)
CHLORIDE: 104 mmol/L (ref 101–111)
CO2: 30 mmol/L (ref 22–32)
CREATININE: 0.84 mg/dL (ref 0.61–1.24)
Calcium: 9.2 mg/dL (ref 8.9–10.3)
GFR calc non Af Amer: >60 mL/min (ref >60)
Glucose, Bld: 93 mg/dL (ref 65–99)
POTASSIUM: 4 mmol/L (ref 3.5–5.1)
Sodium: 141 mmol/L (ref 135–145)

## 2015-10-22 MED ORDER — ALBUTEROL SULFATE (2.5 MG/3ML) 0.083% IN NEBU: 2.5000 mg | INHALATION_SOLUTION | Freq: Four times a day (QID) | RESPIRATORY_TRACT | Status: DC | PRN

## 2015-10-22 MED ORDER — BUDESONIDE-FORMOTEROL FUMARATE 160-4.5 MCG/ACT IN AERO
2.0000 | INHALATION_SPRAY | Freq: Two times a day (BID) | RESPIRATORY_TRACT | Status: DC
  Administered 2015-10-22 – 2015-10-23 (×2): 2 via RESPIRATORY_TRACT
  Filled 2015-10-22: qty 6

## 2015-10-22 MED ORDER — ADULT MULTIVITAMIN W/MINERALS CH
1.0000 | ORAL_TABLET | Freq: Every day | ORAL | Status: DC
  Administered 2015-10-23: 1 via ORAL
  Filled 2015-10-22: qty 1

## 2015-10-22 MED ORDER — ONDANSETRON HCL 4 MG PO TABS: 4.0000 mg | ORAL_TABLET | Freq: Four times a day (QID) | ORAL | Status: DC | PRN

## 2015-10-22 MED ORDER — VITAMIN C 500 MG PO TABS
500.0000 mg | ORAL_TABLET | Freq: Every day | ORAL | Status: DC
  Administered 2015-10-23: 500 mg via ORAL
  Filled 2015-10-22: qty 1

## 2015-10-22 MED ORDER — ALBUTEROL SULFATE (2.5 MG/3ML) 0.083% IN NEBU
5.0000 mg | INHALATION_SOLUTION | Freq: Once | RESPIRATORY_TRACT | Status: AC
  Administered 2015-10-22: 5 mg via RESPIRATORY_TRACT
  Filled 2015-10-22: qty 6

## 2015-10-22 MED ORDER — SODIUM CHLORIDE 0.9 % IJ SOLN
3.0000 mL | Freq: Two times a day (BID) | INTRAMUSCULAR | Status: DC
  Administered 2015-10-22 – 2015-10-23 (×2): 3 mL via INTRAVENOUS

## 2015-10-22 MED ORDER — FLUOXETINE HCL 20 MG PO CAPS
20.0000 mg | ORAL_CAPSULE | Freq: Every day | ORAL | Status: DC
  Administered 2015-10-23: 20 mg via ORAL
  Filled 2015-10-22: qty 1

## 2015-10-22 MED ORDER — ACETAMINOPHEN 325 MG PO TABS: 650.0000 mg | ORAL_TABLET | Freq: Four times a day (QID) | ORAL | Status: DC | PRN

## 2015-10-22 MED ORDER — DIGOXIN 125 MCG PO TABS
125.0000 ug | ORAL_TABLET | Freq: Every day | ORAL | Status: DC
  Administered 2015-10-23: 125 ug via ORAL
  Filled 2015-10-22: qty 1

## 2015-10-22 MED ORDER — ASPIRIN EC 325 MG PO TBEC
325.0000 mg | DELAYED_RELEASE_TABLET | Freq: Every day | ORAL | Status: DC
  Administered 2015-10-23: 325 mg via ORAL
  Filled 2015-10-22: qty 1

## 2015-10-22 MED ORDER — CARVEDILOL 3.125 MG PO TABS
3.1250 mg | ORAL_TABLET | Freq: Two times a day (BID) | ORAL | Status: DC
  Administered 2015-10-22 – 2015-10-23 (×2): 3.125 mg via ORAL
  Filled 2015-10-22 (×2): qty 1

## 2015-10-22 MED ORDER — ACETAMINOPHEN 650 MG RE SUPP
650.0000 mg | Freq: Four times a day (QID) | RECTAL | Status: DC | PRN
Start: 2015-10-22 — End: 2015-10-23

## 2015-10-22 MED ORDER — LISINOPRIL 5 MG PO TABS
5.0000 mg | ORAL_TABLET | Freq: Every day | ORAL | Status: DC
  Filled 2015-10-22: qty 1

## 2015-10-22 MED ORDER — PANTOPRAZOLE SODIUM 40 MG PO TBEC: 40.0000 mg | DELAYED_RELEASE_TABLET | Freq: Every day | ORAL | Status: DC

## 2015-10-22 MED ORDER — TAMSULOSIN HCL 0.4 MG PO CAPS: 0.4000 mg | ORAL_CAPSULE | Freq: Every evening | ORAL | Status: DC

## 2015-10-22 MED ORDER — ONDANSETRON HCL 4 MG/2ML IJ SOLN: 4.0000 mg | Freq: Four times a day (QID) | INTRAMUSCULAR | Status: DC | PRN

## 2015-10-22 NOTE — H&P (Signed)
[facility] NAME: Mathew Reyes    MR#:  [id number]  DATE OF BIRTH:  10/20/1941  DATE OF ADMISSION:  10/22/2015  PRIMARY CARE PHYSICIAN: Idelle Crouch, MD   REQUESTING/REFERRING PHYSICIAN: Dr. Darrick Penna  CHIEF COMPLAINT:   Chief Complaint  Patient presents with  . Shortness of Breath    HISTORY OF PRESENT ILLNESS:  Mathew Reyes  is a 74 y.o. male with a known history of COPD, hypertension, GERD, osteoarthritis, psoriasis, depression, history of CHF, cardiomyopathy who presented to the hospital due to shortness of breath. Patient says her shortness of breath was at rest and he developed suddenly at work today. He was not overly exerting himself. He came to the urgent care at [facility] clinic within referred him to the ER for further evaluation. Patient underwent an EKG in emergency room which showed T-wave inversions in the anterolateral leads. This is apparently new from his previous EKG which was done about a year ago at his cardiologist's office. Patient currently denied any chest pain and is no longer short of breath.  Given his abnormal EKG and risk factors hospitalist services were contacted further treatment and evaluation. She denies any fevers, chills, nausea, vomiting, diaphoresis, palpitations, syncope, chest pain or any other associated symptoms presently.  PAST MEDICAL HISTORY:   Past Medical History  Diagnosis Date  . COPD (chronic obstructive pulmonary disease) ([place])   . Hypertension   . GERD (gastroesophageal reflux disease)   . Arthritis   . Tobacco abuse   . Psoriasis   . Cardiomyopathy ([place])   . CHF (congestive heart failure) ([place])   . Situational anxiety   . SOB (shortness of breath)   . DOE (dyspnea on exertion)   . BPH (benign prostatic hyperplasia)   . Depression   . Abdominal hernia   . Lipidemia     PAST SURGICAL HISTORY:   Past Surgical History  Procedure Laterality Date  .  Appendectomy    . Hernia repair    . Esophagogastroduodenoscopy N/A 03/20/2015    Procedure: ESOPHAGOGASTRODUODENOSCOPY (EGD);  Surgeon: Hulen Luster, MD;  Location: [facility] ENDOSCOPY;  Service: Gastroenterology;  Laterality: N/A;  . Hemmorrhoidectomy    . Throid polyp    . Colonoscopy, esophagogastroduodenoscopy (egd) and esophageal dilation    . Esophagogastroduodenoscopy N/A 08/28/2015    Procedure: ESOPHAGOGASTRODUODENOSCOPY (EGD);  Surgeon: Hulen Luster, MD;  Location: [facility] ENDOSCOPY;  Service: Gastroenterology;  Laterality: N/A;    SOCIAL HISTORY:   Social History  Substance Use Topics  . Smoking status: Current Every Day Smoker -- 1.50 packs/day for 50 years    Types: Cigarettes  . Smokeless tobacco: Not on file  . Alcohol Use: No    FAMILY HISTORY:   Family History  Problem Relation Age of Onset  . Alzheimer's disease Mother   . Aortic aneurysm Father     DRUG ALLERGIES:  No Known Allergies  REVIEW OF SYSTEMS:   Review of Systems  Constitutional: Negative for fever and weight loss.  HENT: Negative for congestion, nosebleeds and tinnitus.   Eyes: Negative for blurred vision, double vision and redness.  Respiratory: Positive for shortness of breath. Negative for cough and hemoptysis.   Cardiovascular: Negative for chest pain, orthopnea, leg swelling and PND.  Gastrointestinal: Negative for nausea, vomiting, abdominal pain, diarrhea and melena.  Genitourinary: Negative for dysuria, urgency and hematuria.  Musculoskeletal: Negative for joint pain and falls.  Neurological: Negative for dizziness, tingling, sensory change, focal  weakness, seizures, weakness and headaches.  Endo/Heme/Allergies: Negative for polydipsia. Does not bruise/bleed easily.  Psychiatric/Behavioral: Negative for depression and memory loss. The patient is not nervous/anxious.     MEDICATIONS AT HOME:   Prior to Admission medications   Medication Sig Start Date End Date Taking? Authorizing Provider   aspirin EC 325 MG tablet Take 325 mg by mouth daily.   Yes Historical Provider, MD  budesonide-formoterol (SYMBICORT) 160-4.5 MCG/ACT inhaler Inhale 2 puffs into the lungs 2 (two) times daily.   Yes Historical Provider, MD  carvedilol (COREG) 3.125 MG tablet Take 3.125 mg by mouth 2 (two) times daily.    Yes Historical Provider, MD  digoxin (LANOXIN) 0.125 MG tablet Take 125 mcg by mouth daily.   Yes Historical Provider, MD  esomeprazole (NEXIUM) 40 MG capsule Take 40 mg by mouth daily.   Yes Historical Provider, MD  FLUoxetine (PROZAC) 20 MG capsule Take 20 mg by mouth daily.   Yes Historical Provider, MD  levalbuterol [facility] HFA) 45 MCG/ACT inhaler Inhale 2 puffs into the lungs every 6 (six) hours as needed for wheezing or shortness of breath.   Yes Historical Provider, MD  lisinopril (PRINIVIL,ZESTRIL) 5 MG tablet Take 5 mg by mouth daily.   Yes Historical Provider, MD  Multiple Vitamin (MULTIVITAMIN WITH MINERALS) TABS tablet Take 1 tablet by mouth daily.   Yes Historical Provider, MD  Omega-3 Fatty Acids (FISH OIL) 1000 MG CAPS Take 1,000 mg by mouth daily.   Yes Historical Provider, MD  sildenafil (VIAGRA) 100 MG tablet Take 100 mg by mouth as needed for erectile dysfunction.    Yes Historical Provider, MD  tamsulosin (FLOMAX) 0.4 MG CAPS capsule Take 0.4 mg by mouth every evening.   Yes Historical Provider, MD  vitamin C (ASCORBIC ACID) 500 MG tablet Take 500 mg by mouth daily.   Yes Historical Provider, MD      VITAL SIGNS:  Blood pressure 172/84, pulse 63, temperature 97.9 F (36.6 C), temperature source Oral, resp. rate 18, height 5\' 7"  (1.702 m), weight 78.019 kg (172 lb), SpO2 94 %.  PHYSICAL EXAMINATION:  Physical Exam  GENERAL:  74 y.o.-year-old patient lying in the bed with no acute distress.  EYES: Pupils equal, round, reactive to light and accommodation. No scleral icterus. Extraocular muscles intact.  HEENT: Head atraumatic, normocephalic. Oropharynx and nasopharynx  clear. No oropharyngeal erythema, moist oral mucosa  NECK:  Supple, no jugular venous distention. No thyroid enlargement, no tenderness.  LUNGS: Normal breath sounds bilaterally, no wheezing, rales, rhonchi. No use of accessory muscles of respiration.  CARDIOVASCULAR: S1, S2 RRR. No murmurs, rubs, gallops, clicks.  ABDOMEN: Soft, nontender, nondistended. Bowel sounds present. No organomegaly or mass.  EXTREMITIES: No pedal edema, cyanosis, or clubbing. + 2 pedal & radial pulses b/l.   NEUROLOGIC: Cranial nerves II through XII are intact. No focal Motor or sensory deficits appreciated b/l PSYCHIATRIC: The patient is alert and oriented x 3. Good affect.  SKIN: No obvious rash, lesion, or ulcer.   LABORATORY PANEL:   CBC  Recent Labs Lab 10/22/15 1400  WBC 10.4  HGB 14.6  HCT 43.8  PLT 195   ------------------------------------------------------------------------------------------------------------------  Chemistries   Recent Labs Lab 10/22/15 1400  NA 141  K 4.0  CL 104  CO2 30  GLUCOSE 93  BUN 17  CREATININE 0.84  CALCIUM 9.2   ------------------------------------------------------------------------------------------------------------------  Cardiac Enzymes  Recent Labs Lab 10/22/15 1400  TROPONINI <0.03   ------------------------------------------------------------------------------------------------------------------  RADIOLOGY:  Dg Chest 2 View  10/22/2015  CLINICAL DATA:  Shortness of breath and hypertension EXAM: CHEST  2 VIEW COMPARISON:  None. FINDINGS: There is no edema or consolidation. Heart size and pulmonary vascularity are normal. No adenopathy. There is atherosclerotic calcification in the aortic arch. There is evidence of an old healed fracture of the left seventh rib. There is degenerative change in the thoracic spine. There is upper lumbar levoscoliosis. IMPRESSION: No edema or consolidation. Electronically Signed   By: Lowella Grip III M.D.    On: 10/22/2015 16:28     IMPRESSION AND PLAN:   74 year old male with past medical history of COPD, hypertension, depression, cardiomyopathy, history of CHF, GERD, presented to the hospital due to shortness of breath and noted to have abnormal EKG.  #1 shortness of breath with abnormal EKG-this is thought to be an anginal equivalent. Clinically although patient is asymptomatic. He does have risk factors given his history of cardiomyopathy, ongoing tobacco abuse. -Patient's EKG shows T-wave inversions in the anterolateral leads which are new. -I will observe him on telemetry, cycle his cardiac markers. Get a two-dimensional echocardiogram, cardiology consult, and a nuclear medicine stress test in the morning. -Continue aspirin, Coreg, Cipro.  #2 COPD-no acute exacerbation. -Continue Symbicort, Xopenex inhaler  #3 GERD-continue Protonix.  #4 BPH-continue Flomax.  #5 hypertension-continue Coreg, lisinopril.   All the records are reviewed and case discussed with ED provider. Management plans discussed with the patient, family and they are in agreement.  CODE STATUS: Full  TOTAL TIME TAKING CARE OF THIS PATIENT: 45 minutes.    Henreitta Leber M.D on 10/22/2015 at 6:19 PM  Between 7am to 6pm - Pager - [phone]  After 6pm go to [email or web address] - password [email or web address] [facility]  Office  [phone]  CC: Primary care physician; Idelle Crouch, MD

## 2015-10-22 NOTE — ED Notes (Signed)
Pt sent over from [facility] on 2L [place] with c/o SOB with dizziness that started suddenly while at work today.. Pt has a hx of COPD but no home O2.. Denies pain or heaviness in chest.. They report O2 sats 91% on RA.Marland Kitchen On arrival pt O2 sats 94%-95%.. States increased SOB with exertion.Marland Kitchen

## 2015-10-22 NOTE — ED Provider Notes (Signed)
[facility] Emergency Department Provider Note  ____________________________________________  Time seen: Approximately 5:11 PM  I have reviewed the triage vital signs and the nursing notes.   HISTORY  Chief Complaint Shortness of Breath    HPI Mathew Reyes is a 74 y.o. male with COPD, GERD, CHF, cardiomyopathy with EF 35-40% who presents for evaluation of resolved shortness of breath, sudden onset today, constant but now resolved, initially severe. The patient reports that he was working at his job (he works in [employer]). He suddenly became short of breath, this worsened with exertion. He felt a little nauseated and lightheaded but denied any chest pain. He reports he has a history of COPD but not has not had any issues with his COPD "for years". No vomiting, diarrhea, fevers or chills. He took 325 mg of aspirin today.   Past Medical History  Diagnosis Date  . COPD (chronic obstructive pulmonary disease) ([place])   . Hypertension   . GERD (gastroesophageal reflux disease)   . Arthritis   . Tobacco abuse   . Psoriasis   . Cardiomyopathy ([place])   . CHF (congestive heart failure) ([place])   . Situational anxiety   . SOB (shortness of breath)   . DOE (dyspnea on exertion)   . BPH (benign prostatic hyperplasia)   . Depression   . Abdominal hernia   . Lipidemia     There are no active problems to display for this patient.   Past Surgical History  Procedure Laterality Date  . Appendectomy    . Hernia repair    . Esophagogastroduodenoscopy N/A 03/20/2015    Procedure: ESOPHAGOGASTRODUODENOSCOPY (EGD);  Surgeon: Hulen Luster, MD;  Location: [facility] ENDOSCOPY;  Service: Gastroenterology;  Laterality: N/A;  . Hemmorrhoidectomy    . Throid polyp    . Colonoscopy, esophagogastroduodenoscopy (egd) and esophageal dilation    . Esophagogastroduodenoscopy N/A 08/28/2015    Procedure: ESOPHAGOGASTRODUODENOSCOPY (EGD);  Surgeon: Hulen Luster, MD;  Location: [facility]  ENDOSCOPY;  Service: Gastroenterology;  Laterality: N/A;    Current Outpatient Rx  Name  Route  Sig  Dispense  Refill  . budesonide-formoterol (SYMBICORT) 160-4.5 MCG/ACT inhaler   Inhalation   Inhale 2 puffs into the lungs 2 (two) times daily.         . carvedilol (COREG) 3.125 MG tablet   Oral   Take 3.125 mg by mouth 2 (two) times daily with a meal.         . digoxin (LANOXIN) 0.125 MG tablet   Oral   Take 125 mcg by mouth daily.      0   . esomeprazole (NEXIUM) 20 MG capsule   Oral   Take 20 mg by mouth daily at 12 noon.         Marland Kitchen FLUoxetine (PROZAC) 10 MG capsule   Oral   Take 10 mg by mouth daily.         Marland Kitchen lisinopril (PRINIVIL,ZESTRIL) 5 MG tablet   Oral   Take 5 mg by mouth daily.         . Multiple Vitamins-Minerals (MULTIVITAMIN & MINERAL PO)   Oral   Take 1 tablet by mouth daily.         . Omega-3 Fatty Acids (FISH OIL PO)   Oral   Take 1 tablet by mouth daily.         Marland Kitchen POTASSIUM PO   Oral   Take 1 tablet by mouth daily.         Marland Kitchen  sildenafil (VIAGRA) 100 MG tablet   Oral   Take 100 mg by mouth daily as needed for erectile dysfunction.         Donita Brooks external suspension   Topical   Apply 1 application topically daily.      1     Dispense as written.   Penne Lash HFA 45 MCG/ACT inhaler   Inhalation   Inhale 1-2 puffs into the lungs every 6 (six) hours as needed for wheezing or shortness of breath.       0     Dispense as written.     Allergies Review of patient's allergies indicates no known allergies.  No family history on file.  Social History Social History  Substance Use Topics  . Smoking status: Current Every Day Smoker -- 1.50 packs/day    Types: Cigarettes  . Smokeless tobacco: None  . Alcohol Use: No    Review of Systems Constitutional: No fever/chills Eyes: No visual changes. ENT: No sore throat. Cardiovascular: Denies chest pain. Respiratory: +shortness of breath. Gastrointestinal: No  abdominal pain.  + nausea, no vomiting.  No diarrhea.  No constipation. Genitourinary: Negative for dysuria. Musculoskeletal: Negative for back pain. Skin: Negative for rash. Neurological: Negative for headaches, focal weakness or numbness.  10-point ROS otherwise negative.  ____________________________________________   PHYSICAL EXAM:  VITAL SIGNS: ED Triage Vitals  Enc Vitals Group     BP 10/22/15 1345 156/94 mmHg     Pulse Rate 10/22/15 1345 71     Resp 10/22/15 1345 18     Temp 10/22/15 1345 97.9 F (36.6 C)     Temp Source 10/22/15 1345 Oral     SpO2 10/22/15 1345 95 %     Weight 10/22/15 1345 172 lb (78.019 kg)     Height 10/22/15 1345 5\' 7"  (1.702 m)     Head Cir --      Peak Flow --      Pain Score --      Pain Loc --      Pain Edu? --      Excl. in [place]? --     Constitutional: Alert and oriented. Well appearing and in no acute distress. Eyes: Conjunctivae are normal. PERRL. EOMI. Head: Atraumatic. Nose: No congestion/rhinnorhea. Mouth/Throat: Mucous membranes are moist.  Oropharynx non-erythematous. Neck: No stridor.  Cardiovascular: Normal rate, regular rhythm. Grossly normal heart sounds.  Good peripheral circulation. Respiratory: Normal respiratory effort.  No retractions. Lungs CTAB. Gastrointestinal: Soft and nontender. No distention.  No CVA tenderness. Genitourinary: deferred Musculoskeletal: No lower extremity tenderness nor edema.  No joint effusions. Neurologic:  Normal speech and language. No gross focal neurologic deficits are appreciated.  Skin:  Skin is warm, dry and intact. No rash noted. Psychiatric: Mood and affect are normal. Speech and behavior are normal.  ____________________________________________   LABS (all labs ordered are listed, but only abnormal results are displayed)  Labs Reviewed  BASIC METABOLIC PANEL  CBC  TROPONIN I   ____________________________________________  EKG  ED ECG REPORT I, Joanne Gavel, the  attending physician, personally viewed and interpreted this ECG.   Date: 10/22/2015  EKG Time: 14:04  Rate: 68  Rhythm: normal sinus rhythm  Axis: left  Intervals:none  ST&T Change: No acute ST elevation. Less than 1 mm ST depression in lead 2, aVF, biphasic T-wave in V3. T-wave inversions in V4, V5, V6.  ____________________________________________  RADIOLOGY  CXR  IMPRESSION: No edema or consolidation. ____________________________________________   PROCEDURES  Procedure(s) performed:  None  Critical Care performed: No  ____________________________________________   INITIAL IMPRESSION / ASSESSMENT AND PLAN / ED COURSE  Pertinent labs & imaging results that were available during my care of the patient were reviewed by me and considered in my medical decision making (see chart for details).  Mathew Reyes is a 74 y.o. male with COPD, GERD, CHF, cardiomyopathy with EF 35-40% who presents for evaluation of resolved shortness of breath, sudden onset today, constant but now resolved, initially severe. On exam, he is well-appearing and in no acute distress. Currently reports he is asymptomatic. His symptoms have resolved 100% after a breathing treatment. Question activation of his reactive airway disease. Lungs clear to auscultation bilaterally. Chest x-ray clear.  Troponin negative and normal CBC and BMP. EKG is concerning given less than 1 mm ST depression in lead 2 and aVF as well as a biphasic T-wave in V3 with T-wave inversions in V4, V5 V6. There are no prior EKGs available for me to compare. Discussed the case with Dr. Ubaldo Glassing, on-call for the patient's cardiologist Dr. Clayborn Bigness, who reports that the EKG is different when compared to the most recent EKG that he found from 2015. The above concerning findings were not present at that time but the patient also had a rate-related bundle branch block which he does not have currently. As discussed with Dr. Ubaldo Glassing, given EKG findings  concerning for ischemia, we'll admit for rule out given concern for possible atypical ACS presentation.  ____________________________________________   FINAL CLINICAL IMPRESSION(S) / ED DIAGNOSES  Final diagnoses:  SOB (shortness of breath) on exertion      Joanne Gavel, MD 10/22/15 1733

## 2015-10-22 NOTE — ED Notes (Signed)
Attempted to call report. RN on floor was not available to take report per secretary and took down name and number

## 2015-10-23 ENCOUNTER — Encounter: Payer: Self-pay | Admitting: Radiology

## 2015-10-23 ENCOUNTER — Observation Stay: Payer: Medicare Other

## 2015-10-23 DIAGNOSIS — R0602 Shortness of breath: Secondary | ICD-10-CM | POA: Diagnosis not present

## 2015-10-23 LAB — TROPONIN I

## 2015-10-23 LAB — NM MYOCAR MULTI W/SPECT W/WALL MOTION / EF
CHL CUP NUCLEAR SDS: 0
LV dias vol: 149 mL
LV sys vol: 60 mL
SRS: 14
SSS: 8
TID: 0.87

## 2015-10-23 MED ORDER — LISINOPRIL 5 MG PO TABS: 5.0000 mg | ORAL_TABLET | Freq: Every day | ORAL | Status: DC

## 2015-10-23 MED ORDER — TECHNETIUM TC 99M SESTAMIBI - CARDIOLITE
30.0000 | Freq: Once | INTRAVENOUS | Status: AC | PRN
  Administered 2015-10-23: 32.02 via INTRAVENOUS

## 2015-10-23 MED ORDER — TECHNETIUM TC 99M SESTAMIBI - CARDIOLITE
10.0000 | Freq: Once | INTRAVENOUS | Status: AC | PRN
  Administered 2015-10-23: 08:00:00 13.34 via INTRAVENOUS

## 2015-10-23 MED ORDER — LISINOPRIL 5 MG PO TABS
5.0000 mg | ORAL_TABLET | Freq: Once | ORAL | Status: AC
  Administered 2015-10-23: 5 mg via ORAL

## 2015-10-23 MED ORDER — REGADENOSON 0.4 MG/5ML IV SOLN
0.4000 mg | Freq: Once | INTRAVENOUS | Status: AC
  Administered 2015-10-23: 0.4 mg via INTRAVENOUS

## 2015-10-23 NOTE — Care Management (Signed)
Patient presents from home with SOB and abnormal EKG.  Patient lives at home with his girlfriend.  Patient is still employed and drives. Patient has no home equipment.  No RNCM needs identified, available if needed for discharge disposition

## 2015-10-23 NOTE — Consult Note (Signed)
[facility]  CARDIOLOGY CONSULT NOTE  Patient ID: BURNEST BRESS MRN: [id number] DOB/AGE: June 02, 1941 74 y.o.  Admit date: 10/22/2015 Referring Physician Dr. Anselm Jungling Primary Physician   Primary Cardiologist Dr. 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 EKG revealed nsr with t wave inversion in lateral leads. Previous available ekg as outpatient last year revaled lbbb. Pt has ruled out for an mi and denies chest pain. CXR revealed no edema or consolidation. He has improved since admission. Has been compliant with meds. . No lower extremity edema  ROS Review of Systems - History obtained from chart review and the patient General ROS: positive for  - sob Respiratory ROS: positive for - shortness of breath Cardiovascular ROS: negative for - chest pain Gastrointestinal ROS: no abdominal pain, change in bowel habits, or black or bloody stools Musculoskeletal ROS: negative Neurological ROS: no TIA or stroke symptoms   Past Medical History  Diagnosis Date  . COPD (chronic obstructive pulmonary disease) ([place])   . Hypertension   . GERD (gastroesophageal reflux disease)   . Arthritis   . Tobacco abuse   . Psoriasis   . Cardiomyopathy ([place])   . CHF (congestive heart failure) ([place])   . Situational anxiety   . SOB (shortness of breath)   . DOE (dyspnea on exertion)   . BPH (benign prostatic hyperplasia)   . Depression   . Abdominal hernia   . Lipidemia     Family History  Problem Relation Age of Onset  . Alzheimer's disease Mother   . Aortic aneurysm Father     Social History   Social History  . Marital Status: Married    Spouse Name: N/A  . Number of Children: N/A  . Years of Education: N/A   Occupational History  . Not on file.   Social History Main Topics  . Smoking  status: Current Every Day Smoker -- 1.50 packs/day for 50 years    Types: Cigarettes  . Smokeless tobacco: Not on file  . Alcohol Use: No  . Drug Use: No  . Sexual Activity: Not on file   Other Topics Concern  . Not on file   Social History Narrative    Past Surgical History  Procedure Laterality Date  . Appendectomy    . Hernia repair    . Esophagogastroduodenoscopy N/A 03/20/2015    Procedure: ESOPHAGOGASTRODUODENOSCOPY (EGD);  Surgeon: Hulen Luster, MD;  Location: [facility] ENDOSCOPY;  Service: Gastroenterology;  Laterality: N/A;  . Hemmorrhoidectomy    . Throid polyp    . Colonoscopy, esophagogastroduodenoscopy (egd) and esophageal dilation    . Esophagogastroduodenoscopy N/A 08/28/2015    Procedure: ESOPHAGOGASTRODUODENOSCOPY (EGD);  Surgeon: Hulen Luster, MD;  Location: [facility] ENDOSCOPY;  Service: Gastroenterology;  Laterality: N/A;     Prescriptions prior to admission  Medication Sig Dispense Refill Last Dose  . aspirin EC 325 MG tablet Take 325 mg by mouth daily.   10/22/2015 at 0600  . budesonide-formoterol (SYMBICORT) 160-4.5 MCG/ACT inhaler Inhale 2 puffs into the lungs 2 (two) times daily.   10/22/2015 at Unknown time  . carvedilol (COREG) 3.125 MG tablet Take 3.125 mg by mouth 2 (two) times daily.    10/22/2015 at 0600  . digoxin (LANOXIN) 0.125 MG tablet Take 125 mcg by mouth daily.  0 10/22/2015 at  0600  . esomeprazole (NEXIUM) 40 MG capsule Take 40 mg by mouth daily.   10/22/2015 at Unknown time  . FLUoxetine (PROZAC) 20 MG capsule Take 20 mg by mouth daily.   10/22/2015 at Unknown time  . levalbuterol (XOPENEX HFA) 45 MCG/ACT inhaler Inhale 2 puffs into the lungs every 6 (six) hours as needed for wheezing or shortness of breath.   10/22/2015 at Unknown time  . lisinopril (PRINIVIL,ZESTRIL) 5 MG tablet Take 5 mg by mouth daily.   10/22/2015 at Unknown time  . Multiple Vitamin (MULTIVITAMIN WITH MINERALS) TABS tablet Take 1 tablet by mouth daily.   10/22/2015 at Unknown time  .  Omega-3 Fatty Acids (FISH OIL) 1000 MG CAPS Take 1,000 mg by mouth daily.   10/22/2015 at Unknown time  . sildenafil (VIAGRA) 100 MG tablet Take 100 mg by mouth as needed for erectile dysfunction.    PRN at PRN  . tamsulosin (FLOMAX) 0.4 MG CAPS capsule Take 0.4 mg by mouth every evening.   10/21/2015 at Unknown time  . vitamin C (ASCORBIC ACID) 500 MG tablet Take 500 mg by mouth daily.   10/22/2015 at Unknown time    Physical Exam: Blood pressure 185/97, pulse 64, temperature 98.1 F (36.7 C), temperature source Oral, resp. rate 18, height 5\' 7"  (1.702 m), weight 76.295 kg (168 lb 3.2 oz), SpO2 92 %.   General appearance: alert and cooperative Resp: clear to auscultation bilaterally Chest wall: no tenderness Cardio: regular rate and rhythm, S1, S2 normal, no murmur, click, rub or gallop GI: soft, non-tender; bowel sounds normal; no masses,  no organomegaly Extremities: extremities normal, atraumatic, no cyanosis or edema Neurologic: Grossly normal Labs:   Lab Results  Component Value Date   WBC 10.4 10/22/2015   HGB 14.6 10/22/2015   HCT 43.8 10/22/2015   MCV 92.4 10/22/2015   PLT 195 10/22/2015    Recent Labs Lab 10/22/15 1400  NA 141  K 4.0  CL 104  CO2 30  BUN 17  CREATININE 0.84  CALCIUM 9.2  GLUCOSE 93   Lab Results  Component Value Date   TROPONINI <0.03 10/23/2015      Radiology: no edema or airspace disease EKG: nsr with lateral t wave inversion  ASSESSMENT AND PLAN:  74 yo male with history of hypertension admitted with sob. EKG showed lateral t wave inversion. WOrks in a Teacher, early years/pre. Ruled out for mi. Echo showed normal lv funciton. Myoview pending. Continue curent meds. Review myoview. If normal, discharge. If abnormal, cath Signed: Teodoro Spray MD, [facility] 10/23/2015, 8:00 AM

## 2015-10-23 NOTE — Progress Notes (Signed)
Patient has SVT on the monitor. Dr. Lavetta Nielsen notified but no new order was given. Will continue to monitor.

## 2015-10-23 NOTE — Progress Notes (Signed)
Pt is a&o, VSS, NSR with an inverted T-wave on tele with no complaints of pain or discomfort. Went down for stress test, which was negative. Order to d/c pt to home. Discharge instructions given to pt with verbal acknowledgment. IV and tele removed and pt escorted off unit via wheelchair by nursing.

## 2015-10-23 NOTE — Progress Notes (Signed)
BP=185/97, Dr. Marcille Blanco notified with a new order for Lisinopril 5 mg oral x 1 dose. Will follow up by first shift. No acute distress noted, patient denied pain. Remain NPO after midnight for Lexiscan procedure.

## 2015-10-23 NOTE — Discharge Summary (Signed)
Mathew Reyes at [facility] NAME: Mathew Reyes    MR#:  [id number]  DATE OF BIRTH:  10-Jun-1941  DATE OF ADMISSION:  10/22/2015 ADMITTING PHYSICIAN: Henreitta Leber, MD  DATE OF DISCHARGE: 10/23/2015  PRIMARY CARE PHYSICIAN: Reyes,Mathew D, MD    ADMISSION DIAGNOSIS:  SOB (shortness of breath) on exertion [R06.02]  DISCHARGE DIAGNOSIS:  Active Problems:   Shortness of breath    Stress test negative. SECONDARY DIAGNOSIS:   Past Medical History  Diagnosis Date  . COPD (chronic obstructive pulmonary disease) ([place])   . Hypertension   . GERD (gastroesophageal reflux disease)   . Arthritis   . Tobacco abuse   . Psoriasis   . Cardiomyopathy ([place])   . CHF (congestive heart failure) ([place])   . Situational anxiety   . SOB (shortness of breath)   . DOE (dyspnea on exertion)   . BPH (benign prostatic hyperplasia)   . Depression   . Abdominal hernia   . Lipidemia     HOSPITAL COURSE:   74 year old male with past medical history of COPD, hypertension, depression, cardiomyopathy, history of CHF, GERD, presented to the hospital due to shortness of breath and noted to have abnormal EKG.  #1 shortness of breath with abnormal EKG-this is thought to be an anginal equivalent. Clinically although patient is asymptomatic. He does have risk factors given his history of cardiomyopathy, ongoing tobacco abuse. -Patient's EKG shows T-wave inversions in the anterolateral leads which are new. - monitored on telemetry, cycle his cardiac markers- remained stable. Negative two-dimensional echocardiogram,    Appreciated cardiology consult, and a nuclear medicine stress test- done which was negative. -Continue aspirin, Coreg, Cipro.  discharge home.  #2 COPD-no acute exacerbation. -Continue Symbicort, Xopenex inhaler  #3 GERD-continue Protonix.  #4 BPH-continue Flomax.  #5 hypertension-continue Coreg, lisinopril.   DISCHARGE CONDITIONS:   Stable.  CONSULTS OBTAINED:  Treatment Team:  Teodoro Spray, MD Corey Skains, MD  DRUG ALLERGIES:  No Known Allergies  DISCHARGE MEDICATIONS:   Current Discharge Medication List    CONTINUE these medications which have NOT CHANGED   Details  aspirin EC 325 MG tablet Take 325 mg by mouth daily.    budesonide-formoterol (SYMBICORT) 160-4.5 MCG/ACT inhaler Inhale 2 puffs into the lungs 2 (two) times daily.    carvedilol (COREG) 3.125 MG tablet Take 3.125 mg by mouth 2 (two) times daily.     digoxin (LANOXIN) 0.125 MG tablet Take 125 mcg by mouth daily. Refills: 0    esomeprazole (NEXIUM) 40 MG capsule Take 40 mg by mouth daily.    FLUoxetine (PROZAC) 20 MG capsule Take 20 mg by mouth daily.    levalbuterol (XOPENEX HFA) 45 MCG/ACT inhaler Inhale 2 puffs into the lungs every 6 (six) hours as needed for wheezing or shortness of breath.    lisinopril (PRINIVIL,ZESTRIL) 5 MG tablet Take 5 mg by mouth daily.    Multiple Vitamin (MULTIVITAMIN WITH MINERALS) TABS tablet Take 1 tablet by mouth daily.    Omega-3 Fatty Acids (FISH OIL) 1000 MG CAPS Take 1,000 mg by mouth daily.    sildenafil (VIAGRA) 100 MG tablet Take 100 mg by mouth as needed for erectile dysfunction.     tamsulosin (FLOMAX) 0.4 MG CAPS capsule Take 0.4 mg by mouth every evening.    vitamin C (ASCORBIC ACID) 500 MG tablet Take 500 mg by mouth daily.         DISCHARGE INSTRUCTIONS:    Follow with PMD  in 1-2 weeks.  If you experience worsening of your admission symptoms, develop shortness of breath, life threatening emergency, suicidal or homicidal thoughts you must seek medical attention immediately by calling 911 or calling your MD immediately  if symptoms less severe.  You Must read complete instructions/literature along with all the possible adverse reactions/side effects for all the Medicines you take and that have been prescribed to you. Take any new Medicines after you have completely understood  and accept all the possible adverse reactions/side effects.   Please note  You were cared for by a hospitalist during your hospital stay. If you have any questions about your discharge medications or the care you received while you were in the hospital after you are discharged, you can call the unit and asked to speak with the hospitalist on call if the hospitalist that took care of you is not available. Once you are discharged, your primary care physician will handle any further medical issues. Please note that NO REFILLS for any discharge medications will be authorized once you are discharged, as it is imperative that you return to your primary care physician (or establish a relationship with a primary care physician if you do not have one) for your aftercare needs so that they can reassess your need for medications and monitor your lab values.    Today   CHIEF COMPLAINT:   Chief Complaint  Patient presents with  . Shortness of Breath    HISTORY OF PRESENT ILLNESS:  Mathew Reyes  is a 74 y.o. male with a known history of COPD, hypertension, GERD, osteoarthritis, psoriasis, depression, history of CHF, cardiomyopathy who presented to the hospital due to shortness of breath. Patient says her shortness of breath was at rest and he developed suddenly at work today. He was not overly exerting himself. He came to the urgent care at [facility] clinic within referred him to the ER for further evaluation. Patient underwent an EKG in emergency room which showed T-wave inversions in the anterolateral leads. This is apparently new from his previous EKG which was done about a year ago at his cardiologist's office. Patient currently denied any chest pain and is no longer short of breath. Given his abnormal EKG and risk factors hospitalist services were contacted further treatment and evaluation. She denies any fevers, chills, nausea, vomiting, diaphoresis, palpitations, syncope, chest pain or any other  associated symptoms presently.  VITAL SIGNS:  Blood pressure 156/91, pulse 55, temperature 97.3 F (36.3 C), temperature source Oral, resp. rate 19, height 5\' 7"  (1.702 m), weight 76.295 kg (168 lb 3.2 oz), SpO2 96 %.  I/O:   Intake/Output Summary (Last 24 hours) at 10/23/15 1350 Last data filed at 10/23/15 0934  Gross per 24 hour  Intake    480 ml  Output   1000 ml  Net   -520 ml    PHYSICAL EXAMINATION:   GENERAL: 74 y.o.-year-old patient lying in the bed with no acute distress.  EYES: Pupils equal, round, reactive to light and accommodation. No scleral icterus. Extraocular muscles intact.  HEENT: Head atraumatic, normocephalic. Oropharynx and nasopharynx clear. No oropharyngeal erythema, moist oral mucosa  NECK: Supple, no jugular venous distention. No thyroid enlargement, no tenderness.  LUNGS: Normal breath sounds bilaterally, no wheezing, rales, rhonchi. No use of accessory muscles of respiration.  CARDIOVASCULAR: S1, S2 RRR. No murmurs, rubs, gallops, clicks.  ABDOMEN: Soft, nontender, nondistended. Bowel sounds present. No organomegaly or mass.  EXTREMITIES: No pedal edema, cyanosis, or clubbing. + 2 pedal &  radial pulses b/l.  NEUROLOGIC: Cranial nerves II through XII are intact. No focal Motor or sensory deficits appreciated b/l PSYCHIATRIC: The patient is alert and oriented x 3. Good affect.  SKIN: No obvious rash, lesion, or ulcer.    DATA REVIEW:   CBC  Recent Labs Lab 10/22/15 1400  WBC 10.4  HGB 14.6  HCT 43.8  PLT 195    Chemistries   Recent Labs Lab 10/22/15 1400  NA 141  K 4.0  CL 104  CO2 30  GLUCOSE 93  BUN 17  CREATININE 0.84  CALCIUM 9.2    Cardiac Enzymes  Recent Labs Lab 10/23/15 [place] <0.03    Microbiology Results  No results found for this or any previous visit.  RADIOLOGY:  Dg Chest 2 View  10/22/2015  CLINICAL DATA:  Shortness of breath and hypertension EXAM: CHEST  2 VIEW COMPARISON:  None.  FINDINGS: There is no edema or consolidation. Heart size and pulmonary vascularity are normal. No adenopathy. There is atherosclerotic calcification in the aortic arch. There is evidence of an old healed fracture of the left seventh rib. There is degenerative change in the thoracic spine. There is upper lumbar levoscoliosis. IMPRESSION: No edema or consolidation. Electronically Signed   By: Lowella Grip III M.D.   On: 10/22/2015 16:28   Nm Myocar Multi W/spect W/wall Motion / Ef  10/23/2015   The study is normal.  This is a low risk study.  Nuclear stress EF: 45%.       Management plans discussed with the patient, family and they are in agreement.  CODE STATUS:     Code Status Orders        Start     Ordered   10/22/15 1951  Full code   Continuous     10/22/15 1950      TOTAL TIME TAKING CARE OF THIS PATIENT: 35 minutes.    Vaughan Basta M.D on 10/23/2015 at 1:50 PM  Between 7am to 6pm - Pager - [phone]  After 6pm go to [email or web address] - password [email or web address] [facility]  Office  [phone]  CC: Primary care physician; Idelle Crouch, MD   Note: This dictation was prepared with Dragon dictation along with smaller phrase technology. Any transcriptional errors that result from this process are unintentional.

## 2015-11-03 DIAGNOSIS — M5136 Other intervertebral disc degeneration, lumbar region: Secondary | ICD-10-CM | POA: Insufficient documentation

## 2015-11-27 ENCOUNTER — Encounter
Admission: RE | Disposition: A | Discharge status: Home / Self Care | Payer: Self-pay | Source: Ambulatory Visit | Attending: Gastroenterology

## 2015-11-27 ENCOUNTER — Ambulatory Visit: Payer: Medicare Other | Admitting: Anesthesiology

## 2015-11-27 ENCOUNTER — Ambulatory Visit
Admission: RE | Admit: 2015-11-27 | Discharge: 2015-11-27 | Disposition: A | Discharge status: Home / Self Care | Payer: Medicare Other | Source: Ambulatory Visit | Attending: Gastroenterology | Admitting: Gastroenterology

## 2015-11-27 DIAGNOSIS — M1990 Unspecified osteoarthritis, unspecified site: Secondary | ICD-10-CM | POA: Diagnosis not present

## 2015-11-27 DIAGNOSIS — Z7982 Long term (current) use of aspirin: Secondary | ICD-10-CM | POA: Diagnosis not present

## 2015-11-27 DIAGNOSIS — F17210 Nicotine dependence, cigarettes, uncomplicated: Secondary | ICD-10-CM | POA: Insufficient documentation

## 2015-11-27 DIAGNOSIS — K2270 Barrett's esophagus without dysplasia: Secondary | ICD-10-CM | POA: Diagnosis present

## 2015-11-27 DIAGNOSIS — Z79899 Other long term (current) drug therapy: Secondary | ICD-10-CM | POA: Diagnosis not present

## 2015-11-27 DIAGNOSIS — K219 Gastro-esophageal reflux disease without esophagitis: Secondary | ICD-10-CM | POA: Diagnosis not present

## 2015-11-27 DIAGNOSIS — N400 Enlarged prostate without lower urinary tract symptoms: Secondary | ICD-10-CM | POA: Diagnosis not present

## 2015-11-27 DIAGNOSIS — I10 Essential (primary) hypertension: Secondary | ICD-10-CM | POA: Diagnosis not present

## 2015-11-27 DIAGNOSIS — E785 Hyperlipidemia, unspecified: Secondary | ICD-10-CM | POA: Diagnosis not present

## 2015-11-27 DIAGNOSIS — J449 Chronic obstructive pulmonary disease, unspecified: Secondary | ICD-10-CM | POA: Diagnosis not present

## 2015-11-27 DIAGNOSIS — F329 Major depressive disorder, single episode, unspecified: Secondary | ICD-10-CM | POA: Diagnosis not present

## 2015-11-27 DIAGNOSIS — N529 Male erectile dysfunction, unspecified: Secondary | ICD-10-CM | POA: Diagnosis not present

## 2015-11-27 DIAGNOSIS — Z7951 Long term (current) use of inhaled steroids: Secondary | ICD-10-CM | POA: Diagnosis not present

## 2015-11-27 HISTORY — PX: ESOPHAGOGASTRODUODENOSCOPY: SHX5428

## 2015-11-27 SURGERY — EGD (ESOPHAGOGASTRODUODENOSCOPY)
Anesthesia: General

## 2015-11-27 MED ORDER — SODIUM CHLORIDE 0.9 % IV SOLN: INTRAVENOUS | Status: DC

## 2015-11-27 MED ORDER — GLYCOPYRROLATE 0.2 MG/ML IJ SOLN
INTRAMUSCULAR | Status: DC | PRN
  Administered 2015-11-27: 0.2 mg via INTRAVENOUS

## 2015-11-27 MED ORDER — PROPOFOL 500 MG/50ML IV EMUL
INTRAVENOUS | Status: DC | PRN
Start: 2015-11-27 — End: 2015-11-27
  Administered 2015-11-27: 120 ug/kg/min via INTRAVENOUS

## 2015-11-27 MED ORDER — FENTANYL CITRATE (PF) 100 MCG/2ML IJ SOLN
INTRAMUSCULAR | Status: DC | PRN
  Administered 2015-11-27: 50 ug via INTRAVENOUS

## 2015-11-27 MED ORDER — SODIUM CHLORIDE 0.9 % IV SOLN
INTRAVENOUS | Status: DC
  Administered 2015-11-27: 1000 mL via INTRAVENOUS

## 2015-11-27 MED ORDER — MIDAZOLAM HCL 2 MG/2ML IJ SOLN
INTRAMUSCULAR | Status: DC | PRN
  Administered 2015-11-27: 1 mg via INTRAVENOUS

## 2015-11-27 MED ORDER — PROPOFOL 10 MG/ML IV BOLUS
INTRAVENOUS | Status: DC | PRN
  Administered 2015-11-27: 100 mg via INTRAVENOUS

## 2015-11-27 MED ORDER — STERILE WATER FOR INJECTION IJ SOLN
Freq: Once | RESPIRATORY_TRACT | Status: AC
  Administered 2015-11-27: 60 mL via OROMUCOSAL
  Filled 2015-11-27: qty 3

## 2015-11-27 NOTE — Anesthesia Preprocedure Evaluation (Signed)
Anesthesia Evaluation  Patient identified by MRN, date of birth, ID band Patient awake    History of Anesthesia Complications Negative for: history of anesthetic complications  Airway Mallampati: III       Dental   Pulmonary shortness of breath and with exertion, COPD,  COPD inhaler, Current Smoker,           Cardiovascular hypertension, Pt. on medications +CHF and + DOE       Neuro/Psych Anxiety Depression    GI/Hepatic Neg liver ROS, GERD  Medicated and Controlled,  Endo/Other  negative endocrine ROS  Renal/GU negative Renal ROS     Musculoskeletal  (+) Arthritis ,   Abdominal   Peds  Hematology negative hematology ROS (+)   Anesthesia Other Findings   Reproductive/Obstetrics                             Anesthesia Physical Anesthesia Plan  ASA: III  Anesthesia Plan: General   Post-op Pain Management:    Induction: Intravenous  Airway Management Planned: Nasal Cannula  Additional Equipment:   Intra-op Plan:   Post-operative Plan:   Informed Consent: I have reviewed the patients History and Physical, chart, labs and discussed the procedure including the risks, benefits and alternatives for the proposed anesthesia with the patient or authorized representative who has indicated his/her understanding and acceptance.     Plan Discussed with:   Anesthesia Plan Comments:         Anesthesia Quick Evaluation

## 2015-11-27 NOTE — Transfer of Care (Signed)
Immediate Anesthesia Transfer of Care Note  Patient: Mathew Reyes  Procedure(s) Performed: Procedure(s): ESOPHAGOGASTRODUODENOSCOPY (EGD) (N/A)  Patient Location: PACU  Anesthesia Type:General  Level of Consciousness: awake, alert  and oriented  Airway & Oxygen Therapy: Patient Spontanous Breathing and Patient connected to nasal cannula oxygen  Post-op Assessment: Report given to RN and Post -op Vital signs reviewed and stable  Post vital signs: Reviewed and stable  Last Vitals:  Filed Vitals:   11/27/15 0842 11/27/15 1006  BP: 176/86 141/90  Pulse: 60   Temp: 35.7 C 36 C  Resp: 16 22    Complications: No apparent anesthesia complications

## 2015-11-27 NOTE — H&P (Signed)
Primary Care Physician:  Idelle Crouch, MD Primary Gastroenterologist:  Dr. Candace Cruise  Pre-Procedure History & Physical: HPI:  Mathew Reyes is a 75 y.o. male is here for an EGD with possible Barrx.   Past Medical History  Diagnosis Date  . COPD (chronic obstructive pulmonary disease) ([place])   . Hypertension   . GERD (gastroesophageal reflux disease)   . Arthritis   . Tobacco abuse   . Psoriasis   . Cardiomyopathy ([place])   . CHF (congestive heart failure) ([place])   . Situational anxiety   . SOB (shortness of breath)   . DOE (dyspnea on exertion)   . BPH (benign prostatic hyperplasia)   . Depression   . Abdominal hernia   . Lipidemia     Past Surgical History  Procedure Laterality Date  . Appendectomy    . Hernia repair    . Esophagogastroduodenoscopy N/A 03/20/2015    Procedure: ESOPHAGOGASTRODUODENOSCOPY (EGD);  Surgeon: Hulen Luster, MD;  Location: [facility] ENDOSCOPY;  Service: Gastroenterology;  Laterality: N/A;  . Hemmorrhoidectomy    . Throid polyp    . Colonoscopy, esophagogastroduodenoscopy (egd) and esophageal dilation    . Esophagogastroduodenoscopy N/A 08/28/2015    Procedure: ESOPHAGOGASTRODUODENOSCOPY (EGD);  Surgeon: Hulen Luster, MD;  Location: [facility] ENDOSCOPY;  Service: Gastroenterology;  Laterality: N/A;    Prior to Admission medications   Medication Sig Start Date End Date Taking? Authorizing Provider  aspirin EC 325 MG tablet Take 325 mg by mouth daily.   Yes Historical Provider, MD  benzonatate (TESSALON) 100 MG capsule Take by mouth 3 (three) times daily as needed for cough.   Yes Historical Provider, MD  budesonide-formoterol (SYMBICORT) 160-4.5 MCG/ACT inhaler Inhale 2 puffs into the lungs 2 (two) times daily.   Yes Historical Provider, MD  carvedilol (COREG) 3.125 MG tablet Take 3.125 mg by mouth 2 (two) times daily.    Yes Historical Provider, MD  digoxin (LANOXIN) 0.125 MG tablet Take 125 mcg by mouth daily.   Yes Historical Provider, MD  doxycycline  (VIBRAMYCIN) 100 MG capsule Take 100 mg by mouth daily.   Yes Historical Provider, MD  esomeprazole (NEXIUM) 40 MG capsule Take 40 mg by mouth daily.   Yes Historical Provider, MD  FLUoxetine (PROZAC) 20 MG capsule Take 20 mg by mouth daily.   Yes Historical Provider, MD  levalbuterol [facility] HFA) 45 MCG/ACT inhaler Inhale 2 puffs into the lungs every 6 (six) hours as needed for wheezing or shortness of breath.   Yes Historical Provider, MD  lisinopril (PRINIVIL,ZESTRIL) 5 MG tablet Take 5 mg by mouth daily.   Yes Historical Provider, MD  Multiple Vitamin (MULTIVITAMIN WITH MINERALS) TABS tablet Take 1 tablet by mouth daily.   Yes Historical Provider, MD  Omega-3 Fatty Acids (FISH OIL) 1000 MG CAPS Take 1,000 mg by mouth daily.   Yes Historical Provider, MD  sildenafil (VIAGRA) 100 MG tablet Take 100 mg by mouth as needed for erectile dysfunction.    Yes Historical Provider, MD  tamsulosin (FLOMAX) 0.4 MG CAPS capsule Take 0.4 mg by mouth every evening.   Yes Historical Provider, MD  vitamin C (ASCORBIC ACID) 500 MG tablet Take 500 mg by mouth daily.   Yes Historical Provider, MD    Allergies as of 11/12/2015  . (No Known Allergies)    Family History  Problem Relation Age of Onset  . Alzheimer's disease Mother   . Aortic aneurysm Father     Social History   Social History  .  Marital Status: Married    Spouse Name: N/A  . Number of Children: N/A  . Years of Education: N/A   Occupational History  . Not on file.   Social History Main Topics  . Smoking status: Current Every Day Smoker -- 1.50 packs/day for 50 years    Types: Cigarettes  . Smokeless tobacco: Not on file  . Alcohol Use: No  . Drug Use: No  . Sexual Activity: Not on file   Other Topics Concern  . Not on file   Social History Narrative    Review of Systems: See HPI, otherwise negative ROS  Physical Exam: BP 176/86 mmHg  Pulse 60  Temp(Src) 96.2 F (35.7 C) (Tympanic)  Resp 16  Ht 5\' 7"  (1.702 m)  Wt  80.74 kg (178 lb)  BMI 27.87 kg/m2  SpO2 97% General:   Alert,  pleasant and cooperative in NAD Head:  Normocephalic and atraumatic. Neck:  Supple; no masses or thyromegaly. Lungs:  Clear throughout to auscultation.    Heart:  Regular rate and rhythm. Abdomen:  Soft, nontender and nondistended. Normal bowel sounds, without guarding, and without rebound.   Neurologic:  Alert and  oriented x4;  grossly normal neurologically.  Impression/Plan: Mathew Reyes is here for an EGD to be performed for hx of long segment Barrett's  Risks, benefits, limitations, and alternatives regarding  EGD have been reviewed with the patient.  Questions have been answered.  All parties agreeable.   Eufelia Veno, Lupita Dawn, MD  11/27/2015, 9:28 AM

## 2015-11-27 NOTE — Anesthesia Postprocedure Evaluation (Signed)
Anesthesia Post Note  Patient: Mathew Reyes  Procedure(s) Performed: Procedure(s) (LRB): ESOPHAGOGASTRODUODENOSCOPY (EGD) (N/A)  Patient location during evaluation: Endoscopy Anesthesia Type: General Level of consciousness: awake and alert Pain management: pain level controlled Vital Signs Assessment: post-procedure vital signs reviewed and stable Respiratory status: spontaneous breathing and respiratory function stable Cardiovascular status: stable Anesthetic complications: no    Last Vitals:  Filed Vitals:   11/27/15 0842 11/27/15 1006  BP: 176/86 141/90  Pulse: 60   Temp: 35.7 C 36 C  Resp: 16 22    Last Pain: There were no vitals filed for this visit.               KEPHART,WILLIAM K

## 2015-11-27 NOTE — Op Note (Signed)
[facility] Gastroenterology Patient Name: Mathew Reyes Nurse Procedure Date: 11/27/2015 9:36 AM MRN: [id number] Account #: [id number] Date of Birth: May 04, 1941 Admit Type: Outpatient Age: 75 Room: [facility] ENDO ROOM 4 Gender: Male Note Status: Finalized Procedure:         Upper GI endoscopy Indications:       For therapy of Barrett's esophagus, Hx of long segment                     Barrett's. S/P Barrx x 5. Providers:         Lupita Dawn. Candace Cruise, MD Referring MD:      Leonie Douglas. Doy Hutching, MD (Referring MD) Medicines:         Monitored Anesthesia Care Complications:     No immediate complications. Procedure:         Pre-Anesthesia Assessment:                    - Prior to the procedure, a History and Physical was                     performed, and patient medications, allergies and                     sensitivities were reviewed. The patient's tolerance of                     previous anesthesia was reviewed.                    - The risks and benefits of the procedure and the sedation                     options and risks were discussed with the patient. All                     questions were answered and informed consent was obtained.                    - After reviewing the risks and benefits, the patient was                     deemed in satisfactory condition to undergo the procedure.                    After obtaining informed consent, the endoscope was passed                     under direct vision. Throughout the procedure, the                     patient's blood pressure, pulse, and oxygen saturations                     were monitored continuously. The Endoscope was introduced                     through the mouth, and advanced to the second part of                     duodenum. The upper GI endoscopy was accomplished without                     difficulty. The patient tolerated the procedure well. Findings:      The esophagus and  gastroesophageal junction were examined  with white       light and narrow band imaging (NBI). There were esophageal mucosal       changes consistent with short-segment Barrett's esophagus. These changes       involved the mucosa extending to the Z-line (38 cm from the incisors).       Salmon-colored mucosa was present. The maximum longitudinal extent of       these esophageal mucosal changes was 0.3 cm in length. Focal       radiofrequency ablation of Barrett's esophagus was performed. With the       endoscope in place, the position and extent of the Barrett's mucosa and       the anatomic landmarks including proximal and distal extent of Barrett's       mucosa were noted. The Barrett's mucosa was irrigated with       N-acetylcysteine (Mucomyst) 1% mixed with water. Esophageal contents       were suctioned. The endoscope was then removed from the patient. The       Barrx-90 radiofrequency ablation catheter was attached to the tip of the       endoscope. The endoscope with the attached radiofrequency ablation       catheter was then passed transorally under direct vision into the       esophagus and advanced to the areas of Barrett's mucosa. The areas       included tongues of Barrett's mucosa. The radiofrequency ablation       catheter was placed in contact with the surface of the Barrett's mucosa       under direct visualization and energy was applied twice at 12 J/cm2.       Ablation was repeated in a likewise fashion to the entire area of       suspected Barrett's mucosa. The ablation zone was cleaned of coagulative       debris. The ablation catheter and endoscope were then removed and the       catheter was cleaned. The catheter and endoscope were reinserted into       the esophagus. A second round of ablation was then performed. Energy was       applied twice at 12 J/cm2 to retreat the areas of Barrett's epithelium       that had been treated with the first series of ablation. The areas of       the esophagus where  Barrett's mucosa had been ablated were carefully       examined. Areas of Barrett's esophagus were completely treated. Total of       24 treatments were applied.      The exam was otherwise without abnormality.      The entire examined stomach was normal.      The examined duodenum was normal. 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 Treated with radiofrequency ablation.                    - The examination was otherwise normal.                    - Normal stomach.                    - Normal  examined duodenum.                    - No specimens collected. Recommendation:    - Discharge patient to home.                    - Observe patient's clinical course.                    - Continue present medications.                    - The findings and recommendations were discussed with the                     patient.                    - GI cocktail and tylenol with codeine elixir prescribed. Procedure Code(s): --- Professional ---                    [phone], Esophagogastroduodenoscopy, flexible, transoral;                     with ablation of tumor(s), polyp(s), or other lesion(s)                     (includes pre- and post-dilation and guide wire passage,                     when performed) Diagnosis Code(s): --- Professional ---                    K22.70, Barrett's esophagus without dysplasia CPT copyright 2014 American Medical Association. All rights reserved. The codes documented in this report are preliminary and upon coder review may  be revised to meet current compliance requirements. Hulen Luster, MD 11/27/2015 10:00:48 AM This report has been signed electronically. Number of Addenda: 0 Note Initiated On: 11/27/2015 9:36 AM      [facility]

## 2015-12-01 ENCOUNTER — Encounter: Payer: Self-pay | Admitting: Gastroenterology

## 2016-02-12 ENCOUNTER — Encounter: Payer: Self-pay | Admitting: Gastroenterology

## 2016-02-12 ENCOUNTER — Ambulatory Visit (INDEPENDENT_AMBULATORY_CARE_PROVIDER_SITE_OTHER): Payer: Medicare Other | Admitting: Gastroenterology

## 2016-02-12 ENCOUNTER — Other Ambulatory Visit: Payer: Self-pay

## 2016-02-12 VITALS — BP 162/86 | HR 62 | Temp 97.9°F | Ht 67.0 in | Wt 176.0 lb

## 2016-02-12 DIAGNOSIS — I429 Cardiomyopathy, unspecified: Secondary | ICD-10-CM | POA: Insufficient documentation

## 2016-02-12 DIAGNOSIS — R0681 Apnea, not elsewhere classified: Secondary | ICD-10-CM | POA: Insufficient documentation

## 2016-02-12 DIAGNOSIS — K219 Gastro-esophageal reflux disease without esophagitis: Secondary | ICD-10-CM | POA: Diagnosis not present

## 2016-02-12 DIAGNOSIS — K2270 Barrett's esophagus without dysplasia: Secondary | ICD-10-CM | POA: Diagnosis not present

## 2016-02-12 DIAGNOSIS — R0602 Shortness of breath: Secondary | ICD-10-CM | POA: Insufficient documentation

## 2016-02-12 DIAGNOSIS — E785 Hyperlipidemia, unspecified: Secondary | ICD-10-CM | POA: Insufficient documentation

## 2016-02-12 DIAGNOSIS — I10 Essential (primary) hypertension: Secondary | ICD-10-CM | POA: Insufficient documentation

## 2016-02-12 DIAGNOSIS — I509 Heart failure, unspecified: Secondary | ICD-10-CM | POA: Insufficient documentation

## 2016-02-12 NOTE — Progress Notes (Signed)
Gastroenterology Consultation  Referring Provider:     Idelle Crouch, MD Primary Care Physician:  Idelle Crouch, MD Primary Gastroenterologist:  Dr. Allen Norris     Reason for Consultation:     Second opinion for Barrett's esophagus        HPI:   Mathew Reyes is a 75 y.o. y/o male referred for consultation & management of Barrett's esophagus by Dr. Idelle Crouch, MD.  This patient comes today with a history of Barrett's esophagus. The patient has gone through 5 upper endoscopies with ablation of the Barrett's esophagus. The patient dates that he is not sure that this is doing anything for him and has been told that he needs more treatment and is reluctant to go through it. The patient has a history of smoking and is now on a proton pump inhibitor. The patient's last 3 upper endoscopies do not show much change in his short segment Barrett's. The patient denies any dysphagia or unexplained weight loss. He denies any heartburn as long as he is on his present medications. He does have a history of long-standing tobacco abuse and cardiac issues.  Past Medical History  Diagnosis Date  . COPD (chronic obstructive pulmonary disease) ([place])   . Hypertension   . GERD (gastroesophageal reflux disease)   . Arthritis   . Tobacco abuse   . Psoriasis   . Cardiomyopathy ([place])   . CHF (congestive heart failure) ([place])   . Situational anxiety   . SOB (shortness of breath)   . DOE (dyspnea on exertion)   . BPH (benign prostatic hyperplasia)   . Depression   . Abdominal hernia   . Lipidemia     Past Surgical History  Procedure Laterality Date  . Appendectomy    . Hernia repair    . Esophagogastroduodenoscopy N/A 03/20/2015    Procedure: ESOPHAGOGASTRODUODENOSCOPY (EGD);  Surgeon: Hulen Luster, MD;  [facility] ENDOSCOPY;  Service: Gastroenterology;  Laterality: N/A;  . Hemmorrhoidectomy    . Throid polyp    . Colonoscopy, esophagogastroduodenoscopy (egd) and esophageal dilation    .  Esophagogastroduodenoscopy N/A 08/28/2015    Procedure: ESOPHAGOGASTRODUODENOSCOPY (EGD);  Surgeon: Hulen Luster, MD;  Location: [facility] ENDOSCOPY;  Service: Gastroenterology;  Laterality: N/A;  . Esophagogastroduodenoscopy N/A 11/27/2015    Procedure: ESOPHAGOGASTRODUODENOSCOPY (EGD);  Surgeon: Hulen Luster, MD;  Location: [facility] ENDOSCOPY;  Service: Gastroenterology;  Laterality: N/A;    Prior to Admission medications   Medication Sig Start Date End Date Taking? Authorizing Provider  aspirin EC 325 MG tablet Take 325 mg by mouth daily.   Yes Historical Provider, MD  budesonide-formoterol (SYMBICORT) 160-4.5 MCG/ACT inhaler Inhale 2 puffs into the lungs 2 (two) times daily.   Yes Historical Provider, MD  carvedilol (COREG) 3.125 MG tablet Take 3.125 mg by mouth 2 (two) times daily.    Yes Historical Provider, MD  clobetasol ointment (TEMOVATE) [id number] % APPLY 1 APPLICATION ON THE SKIN AS DIRECTED, TWICE A WEEK FOR 5 D...  (REFER TO PRESCRIPTION NOTES). 12/18/15  Yes Historical Provider, MD  digoxin (LANOXIN) 0.125 MG tablet Take 125 mcg by mouth daily.   Yes Historical Provider, MD  FLUoxetine (PROZAC) 20 MG capsule Take 20 mg by mouth daily.   Yes Historical Provider, MD  lisinopril (PRINIVIL,ZESTRIL) 5 MG tablet Take 5 mg by mouth daily.   Yes Historical Provider, MD  Multiple Vitamin (MULTIVITAMIN WITH MINERALS) TABS tablet Take 1 tablet by mouth daily.   Yes Historical Provider, MD  Omega-3 Fatty  Acids (FISH OIL) 1000 MG CAPS Take 1,000 mg by mouth daily.   Yes Historical Provider, MD  omeprazole (PRILOSEC) 20 MG capsule Take by mouth. 01/26/16 01/25/17 Yes Historical Provider, MD  sildenafil (VIAGRA) 100 MG tablet Take 100 mg by mouth as needed for erectile dysfunction.    Yes Historical Provider, MD  vitamin C (ASCORBIC ACID) 500 MG tablet Take 500 mg by mouth daily.   Yes Historical Provider, MD  acetaminophen-codeine 120-12 MG/5ML solution Reported on 02/12/2016 02/11/16   Historical Provider, MD  benzonatate  (TESSALON) 100 MG capsule Take by mouth 3 (three) times daily as needed for cough. Reported on 02/12/2016    Historical Provider, MD  doxycycline (VIBRAMYCIN) 100 MG capsule Take 100 mg by mouth daily. Reported on 02/12/2016    Historical Provider, MD  esomeprazole (NEXIUM) 40 MG capsule Take 40 mg by mouth daily. Reported on 02/12/2016    Historical Provider, MD  levalbuterol [facility] HFA) 45 MCG/ACT inhaler Inhale 2 puffs into the lungs every 6 (six) hours as needed for wheezing or shortness of breath. Reported on 02/12/2016    Historical Provider, MD  tamsulosin (FLOMAX) 0.4 MG CAPS capsule Take 0.4 mg by mouth every evening. Reported on 02/12/2016    Historical Provider, MD    Family History  Problem Relation Age of Onset  . Alzheimer's disease Mother   . Aortic aneurysm Father      Social History  Substance Use Topics  . Smoking status: Current Every Day Smoker -- 1.50 packs/day for 50 years    Types: Cigarettes  . Smokeless tobacco: Never Used  . Alcohol Use: No    Allergies as of 02/12/2016  . (No Known Allergies)    Review of Systems:    All systems reviewed and negative except where noted in HPI.   Physical Exam:  BP 162/86 mmHg  Pulse 62  Temp(Src) 97.9 F (36.6 C) (Oral)  Ht 5\' 7"  (1.702 m)  Wt 176 lb (79.833 kg)  BMI 27.56 kg/m2 No LMP for male patient. Psych:  Alert and cooperative. Normal mood and affect. General:   Alert,  Well-developed, well-nourished, pleasant and cooperative in NAD Head:  Normocephalic and atraumatic. Eyes:  Sclera clear, no icterus.   Conjunctiva pink. Ears:  Normal auditory acuity. Nose:  No deformity, discharge, or lesions. Mouth:  No deformity or lesions,oropharynx pink & moist. Neck:  Supple; no masses or thyromegaly. Lungs:  Respirations even and unlabored.  Clear throughout to auscultation.   No wheezes, crackles, or rhonchi. No acute distress. Heart:  Regular rate and rhythm; no murmurs, clicks, rubs, or gallops. Abdomen:  Normal bowel  sounds.  No bruits.  Soft, non-tender and non-distended without masses, hepatosplenomegaly or hernias noted.  No guarding or rebound tenderness.  Negative Carnett sign.   Rectal:  Deferred.  Msk:  Symmetrical without gross deformities.  Good, equal movement & strength bilaterally. Pulses:  Normal pulses noted. Extremities:  No clubbing or edema.  No cyanosis. Neurologic:  Alert and oriented x3;  grossly normal neurologically. Skin:  Intact without significant lesions or rashes.  No jaundice. Lymph Nodes:  No significant cervical adenopathy. Psych:  Alert and cooperative. Normal mood and affect.  Imaging Studies: No results found.  Assessment and Plan:   Mathew Reyes is a 75 y.o. y/o male who comes in today for a second opinion after having 5 treatments of his Barrett's esophagus with radiofrequency ablation. The patient has very little Barrett's mucosa left from the imaging done on his  recent upper endoscopies. There is seems to be very little change over the last 3 treatments. I explained to him that the risk of Barrett's esophagus turning into cancer is low and at his age I think the risks associated with repeat upper endoscopies and sedation outweigh the benefits of the low incidence of esophageal cancer. The patient also had a colonoscopy 2 years ago and it appears that no biopsies were taken at that time and he is not due for another colonoscopy. If the patient is in good health in 3 years a upper endoscopy may be warranted to see if there is any changes in the Barrett's esophagus. Otherwise the patient has been told that he should continue taking his present medications and contact me if he has any further symptoms or has any further questions.   Note: This dictation was prepared with Dragon dictation along with smaller phrase technology. Any transcriptional errors that result from this process are unintentional.

## 2016-05-05 ENCOUNTER — Ambulatory Visit (INDEPENDENT_AMBULATORY_CARE_PROVIDER_SITE_OTHER): Payer: Medicare Other | Admitting: Urology

## 2016-05-05 ENCOUNTER — Encounter: Payer: Self-pay | Admitting: Urology

## 2016-05-05 VITALS — BP 147/89 | HR 68 | Ht 67.0 in | Wt 180.3 lb

## 2016-05-05 DIAGNOSIS — R972 Elevated prostate specific antigen [PSA]: Secondary | ICD-10-CM

## 2016-05-05 DIAGNOSIS — N138 Other obstructive and reflux uropathy: Secondary | ICD-10-CM

## 2016-05-05 DIAGNOSIS — N401 Enlarged prostate with lower urinary tract symptoms: Secondary | ICD-10-CM | POA: Diagnosis not present

## 2016-05-05 MED ORDER — FINASTERIDE 5 MG PO TABS: 5.0000 mg | ORAL_TABLET | Freq: Every day | ORAL | Status: DC

## 2016-05-05 NOTE — Progress Notes (Signed)
05/05/2016 3:35 PM   Mathew Reyes 1941/05/18 [id number]  Referring provider: Idelle Crouch, MD [facility] [place], [place] [address]  Chief Complaint  Patient presents with  . Benign Prostatic Hypertrophy    referred by Dr. Doy Hutching  . Elevated PSA    HPI: Patient is a 75 year old Caucasian male who is referred by his primary care physician, Dr. Doy Hutching, for an elevated PSA and BPH.  Patient's PSA has risen from 2.87 ng/dL on 01/21/2015 to 4.73 ng/dL on 04/15/2016.  He denies any genitourinary instrumentation, passage of hard stool or in ejaculation during the time the blood work was drawn.  He does not have a family history of prostate cancer.  He is not on an 5 alpha reductase inhibitor.  He has seen Dr. Bernardo Heater in the past.  He has not had a biospy of his prostate.    His IPSS score today is 4, which is mild lower urinary tract symptomatology. He is mostly dissatisfied with his quality life due to his urinary symptoms.   His major complaint today is very frequency, urgency, nocturia and postvoid dribbling.Marland Kitchen  He has had these symptoms for over one month.  He denies any dysuria, hematuria or suprapubic pain.  He currently taking tamsulosin 0.4 mg daily.  He also denies any recent fevers, chills, nausea or vomiting.  He does not have a family history of PCa.      IPSS      05/05/16 0800       International Prostate Symptom Score   How often have you had the sensation of not emptying your bladder? Not at All     How often have you had to urinate less than every two hours? Less than 1 in 5 times     How often have you found you stopped and started again several times when you urinated? Not at All     How often have you found it difficult to postpone urination? Less than half the time     How often have you had a weak urinary stream? Not at All     How often have you had to strain to start urination? Not at All     How many times did you typically  get up at night to urinate? 1 Time     Total IPSS Score 4     Quality of Life due to urinary symptoms   If you were to spend the rest of your life with your urinary condition just the way it is now how would you feel about that? Mostly Disatisfied        Score:  1-7 Mild 8-19 Moderate 20-35 Severe  PMH: Past Medical History  Diagnosis Date  . COPD (chronic obstructive pulmonary disease) ([place])   . Hypertension   . GERD (gastroesophageal reflux disease)   . Arthritis   . Tobacco abuse   . Psoriasis   . Cardiomyopathy ([place])   . CHF (congestive heart failure) ([place])   . Situational anxiety   . SOB (shortness of breath)   . DOE (dyspnea on exertion)   . BPH (benign prostatic hyperplasia)   . Depression   . Abdominal hernia   . Lipidemia   . Anxiety     Surgical History: Past Surgical History  Procedure Laterality Date  . Appendectomy    . Hernia repair    . Esophagogastroduodenoscopy N/A 03/20/2015    Procedure: ESOPHAGOGASTRODUODENOSCOPY (EGD);  Surgeon: Hulen Luster,  MD;  Location: [facility] ENDOSCOPY;  Service: Gastroenterology;  Laterality: N/A;  . Hemmorrhoidectomy    . Throid polyp    . Colonoscopy, esophagogastroduodenoscopy (egd) and esophageal dilation    . Esophagogastroduodenoscopy N/A 08/28/2015    Procedure: ESOPHAGOGASTRODUODENOSCOPY (EGD);  Surgeon: Hulen Luster, MD;  Location: [facility] ENDOSCOPY;  Service: Gastroenterology;  Laterality: N/A;  . Esophagogastroduodenoscopy N/A 11/27/2015    Procedure: ESOPHAGOGASTRODUODENOSCOPY (EGD);  Surgeon: Hulen Luster, MD;  Location: [facility] ENDOSCOPY;  Service: Gastroenterology;  Laterality: N/A;    Home Medications:    Medication List       This list is accurate as of: 05/05/16 11:59 PM.  Always use your most recent med list.               acetaminophen-codeine 120-12 MG/5ML solution  Reported on 05/05/2016     aspirin EC 325 MG tablet  Take 325 mg by mouth daily.     benzonatate 100 MG capsule  Commonly known as:  TESSALON    Take by mouth 3 (three) times daily as needed for cough. Reported on 05/05/2016     budesonide-formoterol 160-4.5 MCG/ACT inhaler  Commonly known as:  SYMBICORT  Inhale 2 puffs into the lungs 2 (two) times daily.     carvedilol 3.125 MG tablet  Commonly known as:  COREG  Take 3.125 mg by mouth 2 (two) times daily.     clobetasol ointment 0.05 %  Commonly known as:  TEMOVATE  APPLY 1 APPLICATION ON THE SKIN AS DIRECTED, TWICE A WEEK FOR 5 D...  (REFER TO PRESCRIPTION NOTES).     digoxin 0.125 MG tablet  Commonly known as:  LANOXIN  Take 125 mcg by mouth daily.     doxycycline 100 MG capsule  Commonly known as:  VIBRAMYCIN  Take 100 mg by mouth daily. Reported on 05/05/2016     esomeprazole 40 MG capsule  Commonly known as:  NEXIUM  Take 40 mg by mouth daily. Reported on 05/05/2016     finasteride 5 MG tablet  Commonly known as:  PROSCAR  Take 1 tablet (5 mg total) by mouth daily.     Fish Oil 1000 MG Caps  Take 1,000 mg by mouth daily.     FLUoxetine 20 MG capsule  Commonly known as:  PROZAC  Take 20 mg by mouth daily.     levalbuterol 45 MCG/ACT inhaler  Commonly known as:  XOPENEX HFA  Inhale 2 puffs into the lungs every 6 (six) hours as needed for wheezing or shortness of breath. Reported on 05/05/2016     lisinopril 5 MG tablet  Commonly known as:  PRINIVIL,ZESTRIL  Take 5 mg by mouth daily.     multivitamin with minerals Tabs tablet  Take 1 tablet by mouth daily.     omeprazole 20 MG capsule  Commonly known as:  PRILOSEC  Take by mouth.     sildenafil 100 MG tablet  Commonly known as:  VIAGRA  Take 100 mg by mouth as needed for erectile dysfunction.     tamsulosin 0.4 MG Caps capsule  Commonly known as:  FLOMAX  Take 0.4 mg by mouth every evening. Reported on 02/12/2016     vitamin C 500 MG tablet  Commonly known as:  ASCORBIC ACID  Take 500 mg by mouth daily.        Allergies: No Known Allergies  Family History: Family History  Problem Relation  Age of Onset  . Alzheimer's disease Mother   . Aortic aneurysm  Father   . Kidney disease Neg Hx   . Prostate cancer Neg Hx     Social History:  reports that he has been smoking Cigarettes.  He has a 75 pack-year smoking history. He has never used smokeless tobacco. He reports that he drinks alcohol. He reports that he does not use illicit drugs.  ROS: UROLOGY Frequent Urination?: Yes Hard to postpone urination?: Yes Burning/pain with urination?: No Get up at night to urinate?: Yes Leakage of urine?: Yes Urine stream starts and stops?: No Trouble starting stream?: No Do you have to strain to urinate?: No Blood in urine?: No Urinary tract infection?: No Sexually transmitted disease?: No Injury to kidneys or bladder?: No Painful intercourse?: No Weak stream?: No Erection problems?: No Penile pain?: No  Gastrointestinal Nausea?: No Vomiting?: No Indigestion/heartburn?: No Diarrhea?: No Constipation?: No  Constitutional Fever: No Night sweats?: No Weight loss?: No Fatigue?: No  Skin Skin rash/lesions?: No Itching?: No  Eyes Blurred vision?: No Double vision?: No  Ears/Nose/Throat Sore throat?: No Sinus problems?: No  Hematologic/Lymphatic Swollen glands?: No Easy bruising?: No  Cardiovascular Leg swelling?: No Chest pain?: No  Respiratory Cough?: No Shortness of breath?: No  Endocrine Excessive thirst?: No  Musculoskeletal Back pain?: No Joint pain?: Yes  Neurological Headaches?: No Dizziness?: No  Psychologic Depression?: No Anxiety?: No  Physical Exam: BP 147/89 mmHg  Pulse 68  Ht 5\' 7"  (1.702 m)  Wt 180 lb 4.8 oz (81.784 kg)  BMI 28.23 kg/m2  Constitutional: Well nourished. Alert and oriented, No acute distress. HEENT: [place] AT, moist mucus membranes. Trachea midline, no masses. Cardiovascular: No clubbing, cyanosis, or edema. Respiratory: Normal respiratory effort, no increased work of breathing. GI: Abdomen is soft, non tender,  non distended, no abdominal masses. Liver and spleen not palpable.  No hernias appreciated.  Stool sample for occult testing is not indicated.   GU: No CVA tenderness.  No bladder fullness or masses.  Patient with circumcised phallus.  Urethral meatus is patent.  No penile discharge. No penile lesions or rashes. Scrotum without lesions, cysts, rashes and/or edema.  Testicles are located scrotally bilaterally. No masses are appreciated in the testicles. Left and right epididymis are normal. Rectal: Patient with  normal sphincter tone. Anus and perineum without scarring or rashes. No rectal masses are appreciated. Prostate is approximately 55 grams, no nodules are appreciated. Seminal vesicles are normal. Skin: No rashes, bruises or suspicious lesions. Lymph: No cervical or inguinal adenopathy. Neurologic: Grossly intact, no focal deficits, moving all 4 extremities. Psychiatric: Normal mood and affect.  Laboratory Data: Lab Results  Component Value Date   WBC 10.4 10/22/2015   HGB 14.6 10/22/2015   HCT 43.8 10/22/2015   MCV 92.4 10/22/2015   PLT 195 10/22/2015    Lab Results  Component Value Date   CREATININE 0.84 10/22/2015   PSA history  2.87 ng/dL on 01/21/2015  4.73 ng/mL on 04/15/2016    Assessment & Plan:    1. Elevated PSA:   Patient did have a increase in PSA velocity over the last year.  I have suggested that we repeat the PSA today to rule out laboratory error.    - PSA  2. BPH with LUTS  - IPSS score is 4/4  - Initiate 5 alpha reductase inhibitor (finasteride 5 mg daily), discussed side effects  - Continue tamsulosin 04. Mg daily  - RTC in three months for IPSS and PSA  Return in about 3 months (around 08/05/2016) for IPSS and PSA.  These notes generated with voice recognition software. I apologize for typographical errors.  Zara Council, [facility] [address], [place], [place] [address] [phone]

## 2016-05-06 LAB — PSA: Prostate Specific Ag, Serum: 3.3 ng/mL (ref 0.0–4.0)

## 2016-05-08 DIAGNOSIS — N401 Enlarged prostate with lower urinary tract symptoms: Secondary | ICD-10-CM

## 2016-05-08 DIAGNOSIS — N138 Other obstructive and reflux uropathy: Secondary | ICD-10-CM | POA: Insufficient documentation

## 2016-05-17 ENCOUNTER — Telehealth: Payer: Self-pay

## 2016-05-17 NOTE — Telephone Encounter (Signed)
LMOM- most recent labs have decreased in value. Continue taking current medications.

## 2016-05-17 NOTE — Telephone Encounter (Signed)
-----   Message from Nori Riis, PA-C sent at 05/08/2016  3:36 PM EDT ----- Please let the patient know that his PSA did decrease in value. I would like him to continue the tamsulosin and finasteride and follow up as scheduled in 3 months.

## 2016-08-02 ENCOUNTER — Encounter: Payer: Self-pay | Admitting: Urology

## 2016-08-02 ENCOUNTER — Ambulatory Visit (INDEPENDENT_AMBULATORY_CARE_PROVIDER_SITE_OTHER): Payer: Medicare Other | Admitting: Urology

## 2016-08-02 VITALS — BP 150/80 | HR 72 | Ht 67.0 in | Wt 180.0 lb

## 2016-08-02 DIAGNOSIS — N401 Enlarged prostate with lower urinary tract symptoms: Secondary | ICD-10-CM

## 2016-08-02 DIAGNOSIS — N138 Other obstructive and reflux uropathy: Secondary | ICD-10-CM

## 2016-08-02 DIAGNOSIS — R972 Elevated prostate specific antigen [PSA]: Secondary | ICD-10-CM | POA: Diagnosis not present

## 2016-08-02 MED ORDER — FINASTERIDE 5 MG PO TABS: 5.0000 mg | ORAL_TABLET | Freq: Every day | ORAL | 4 refills | Status: AC

## 2016-08-02 NOTE — Progress Notes (Signed)
08/02/2016 8:54 AM   Mathew Reyes [birth year] [id number]  Referring provider: Idelle Crouch, MD [facility] [place], [place] [address]  Chief Complaint  Patient presents with  . Elevated PSA    39months    HPI: Patient is a 75 year old Caucasian male who presents today for a 3 month follow up for an elevated PSA and BPH with LUTS.    Background history Patient was referred by his primary care physician, Dr. Doy Hutching, for an elevated PSA and BPH.  Patient's PSA has risen from 2.87 ng/dL on 01/21/2015 to 4.73 ng/dL on 04/15/2016.  He denied any genitourinary instrumentation, passage of hard stool or in ejaculation during the time the blood work was drawn.  He does not have a family history of prostate cancer.  He was not on an 5 alpha reductase inhibitor.  He has seen Dr. Bernardo Heater in the past.  He has not had a biospy of his prostate.    His IPSS score today is 5, which is mild lower urinary tract symptomatology. He is mostly satisfied with his quality life due to his urinary symptoms.   His previous IPSS score was 4/4.  His major complaint today is very frequency, urgency and nocturia x 2.  He is not longer having postvoid dribbling.  He does not find these bothersome.  He has had these symptoms for over one month.  He denies any dysuria, hematuria or suprapubic pain.  He currently taking finasteride 5 mg daily.  He also denies any recent fevers, chills, nausea or vomiting.  He does not have a family history of PCa.      IPSS    Row Name 08/02/16 0800         International Prostate Symptom Score   How often have you had the sensation of not emptying your bladder? Not at All     How often have you had to urinate less than every two hours? Less than 1 in 5 times     How often have you found you stopped and started again several times when you urinated? Not at All     How often have you found it difficult to postpone urination? Less than half the time      How often have you had a weak urinary stream? Not at All     How often have you had to strain to start urination? Not at All     How many times did you typically get up at night to urinate? 2 Times     Total IPSS Score 5       Quality of Life due to urinary symptoms   If you were to spend the rest of your life with your urinary condition just the way it is now how would you feel about that? Mostly Satisfied        Score:  1-7 Mild 8-19 Moderate 20-35 Severe  PMH: Past Medical History:  Diagnosis Date  . Abdominal hernia   . Anxiety   . Arthritis   . BPH (benign prostatic hyperplasia)   . Cardiomyopathy ([place])   . CHF (congestive heart failure) ([place])   . COPD (chronic obstructive pulmonary disease) ([place])   . Depression   . DOE (dyspnea on exertion)   . GERD (gastroesophageal reflux disease)   . Hypertension   . Lipidemia   . Psoriasis   . Situational anxiety   . SOB (shortness of breath)   . Tobacco abuse  Surgical History: Past Surgical History:  Procedure Laterality Date  . APPENDECTOMY    . COLONOSCOPY, ESOPHAGOGASTRODUODENOSCOPY (EGD) AND ESOPHAGEAL DILATION    . ESOPHAGOGASTRODUODENOSCOPY N/A 03/20/2015   Procedure: ESOPHAGOGASTRODUODENOSCOPY (EGD);  Surgeon: Hulen Luster, MD;  Location: [facility] ENDOSCOPY;  Service: Gastroenterology;  Laterality: N/A;  . ESOPHAGOGASTRODUODENOSCOPY N/A 08/28/2015   Procedure: ESOPHAGOGASTRODUODENOSCOPY (EGD);  Surgeon: Hulen Luster, MD;  Location: [facility] ENDOSCOPY;  Service: Gastroenterology;  Laterality: N/A;  . ESOPHAGOGASTRODUODENOSCOPY N/A 11/27/2015   Procedure: ESOPHAGOGASTRODUODENOSCOPY (EGD);  Surgeon: Hulen Luster, MD;  Location: [facility] ENDOSCOPY;  Service: Gastroenterology;  Laterality: N/A;  . Hemmorrhoidectomy    . HERNIA REPAIR    . Throid polyp      Home Medications:    Medication List       Accurate as of 08/02/16  8:54 AM. Always use your most recent med list.          aspirin EC 325 MG tablet Take 325 mg by mouth  daily.   budesonide-formoterol 160-4.5 MCG/ACT inhaler Commonly known as:  SYMBICORT Inhale 2 puffs into the lungs 2 (two) times daily.   carvedilol 3.125 MG tablet Commonly known as:  COREG Take 3.125 mg by mouth 2 (two) times daily.   clobetasol ointment 0.05 % Commonly known as:  TEMOVATE APPLY 1 APPLICATION ON THE SKIN AS DIRECTED, TWICE A WEEK FOR 5 D...  (REFER TO PRESCRIPTION NOTES).   digoxin 0.125 MG tablet Commonly known as:  LANOXIN Take 125 mcg by mouth daily.   finasteride 5 MG tablet Commonly known as:  PROSCAR Take 1 tablet (5 mg total) by mouth daily.   Fish Oil 1000 MG Caps Take 1,000 mg by mouth daily.   FLUoxetine 20 MG capsule Commonly known as:  PROZAC Take 20 mg by mouth daily.   lisinopril 5 MG tablet Commonly known as:  PRINIVIL,ZESTRIL Take 5 mg by mouth daily.   multivitamin with minerals Tabs tablet Take 1 tablet by mouth daily.   omeprazole 20 MG capsule Commonly known as:  PRILOSEC Take by mouth.   sildenafil 100 MG tablet Commonly known as:  VIAGRA Take 100 mg by mouth as needed for erectile dysfunction.   vitamin C 500 MG tablet Commonly known as:  ASCORBIC ACID Take 500 mg by mouth daily.       Allergies: No Known Allergies  Family History: Family History  Problem Relation Age of Onset  . Alzheimer's disease Mother   . Aortic aneurysm Father   . Kidney disease Neg Hx   . Prostate cancer Neg Hx     Social History:  reports that he has been smoking Cigarettes.  He has a 75.00 pack-year smoking history. He has never used smokeless tobacco. He reports that he drinks alcohol. He reports that he does not use drugs.  ROS: UROLOGY Frequent Urination?: Yes Hard to postpone urination?: No Burning/pain with urination?: No Get up at night to urinate?: Yes Leakage of urine?: No Urine stream starts and stops?: No Trouble starting stream?: No Do you have to strain to urinate?: No Blood in urine?: No Urinary tract infection?:  No Sexually transmitted disease?: No Injury to kidneys or bladder?: No Painful intercourse?: No Weak stream?: No Erection problems?: No Penile pain?: No  Gastrointestinal Nausea?: No Vomiting?: No Indigestion/heartburn?: No Diarrhea?: No Constipation?: No  Constitutional Fever: No Night sweats?: No Weight loss?: No Fatigue?: No  Skin Skin rash/lesions?: No Itching?: No  Eyes Blurred vision?: No Double vision?: No  Ears/Nose/Throat Sore throat?: No Sinus  problems?: No  Hematologic/Lymphatic Swollen glands?: No Easy bruising?: No  Cardiovascular Leg swelling?: No Chest pain?: No  Respiratory Cough?: Yes Shortness of breath?: Yes  Endocrine Excessive thirst?: No  Musculoskeletal Back pain?: No Joint pain?: No  Neurological Headaches?: No Dizziness?: No  Psychologic Depression?: No Anxiety?: No  Physical Exam: BP (!) 150/80   Pulse 72   Ht 5\' 7"  (1.702 m)   Wt 180 lb (81.6 kg)   BMI 28.19 kg/m   Constitutional: Well nourished. Alert and oriented, No acute distress. HEENT: [place] AT, moist mucus membranes. Trachea midline, no masses. Cardiovascular: No clubbing, cyanosis, or edema. Respiratory: Normal respiratory effort, no increased work of breathing. GI: Abdomen is soft, non tender, non distended, no abdominal masses. Liver and spleen not palpable.  No hernias appreciated.  Stool sample for occult testing is not indicated.   GU: No CVA tenderness.  No bladder fullness or masses.  Patient with circumcised phallus.  Urethral meatus is patent.  No penile discharge. No penile lesions or rashes. Scrotum without lesions, cysts, rashes and/or edema.  Testicles are located scrotally bilaterally. No masses are appreciated in the testicles. Left and right epididymis are normal. Rectal: Patient with  normal sphincter tone. Anus and perineum without scarring or rashes. No rectal masses are appreciated. Prostate is approximately 55 grams, no nodules are  appreciated. Seminal vesicles are normal. Skin: No rashes, bruises or suspicious lesions. Lymph: No cervical or inguinal adenopathy. Neurologic: Grossly intact, no focal deficits, moving all 4 extremities. Psychiatric: Normal mood and affect.  Laboratory Data: Lab Results  Component Value Date   WBC 10.4 10/22/2015   HGB 14.6 10/22/2015   HCT 43.8 10/22/2015   MCV 92.4 10/22/2015   PLT 195 10/22/2015    Lab Results  Component Value Date   CREATININE 0.84 10/22/2015   PSA history  2.87 ng/dL on 01/21/2015  4.73 ng/mL on 04/15/2016  Assessment & Plan:    1. Elevated PSA  - PSA  2. BPH with LUTS  - IPSS score is 5/2, it is stable  - Initiate 5 alpha reductase inhibitor (finasteride 5 mg daily), discussed side effects  - Continue tamsulosin 04. Mg daily  - RTC in three months for IPSS and PSA  Return in about 6 months (around 01/30/2017) for IPSS, PSA and exam.  These notes generated with voice recognition software. I apologize for typographical errors.  Zara Council, [facility] [address], [place], [place] [id number] [phone]

## 2016-08-03 LAB — PSA: Prostate Specific Ag, Serum: 2 ng/mL (ref 0.0–4.0)

## 2016-08-04 ENCOUNTER — Telehealth: Payer: Self-pay

## 2016-08-04 NOTE — Telephone Encounter (Signed)
-----   Message from Nori Riis, PA-C sent at 08/03/2016  8:31 PM EDT ----- PSA is stable.  We will see him in 6 months.

## 2016-08-04 NOTE — Telephone Encounter (Signed)
LMOM- most recent labs are stable. Will see again in 1 year.

## 2016-09-03 DIAGNOSIS — J449 Chronic obstructive pulmonary disease, unspecified: Secondary | ICD-10-CM | POA: Insufficient documentation

## 2017-01-24 ENCOUNTER — Other Ambulatory Visit: Payer: Medicare Other

## 2017-01-31 ENCOUNTER — Ambulatory Visit: Payer: Medicare Other | Admitting: Urology

## 2017-10-09 ENCOUNTER — Other Ambulatory Visit: Payer: Self-pay | Admitting: Urology

## 2018-08-25 ENCOUNTER — Other Ambulatory Visit: Payer: Self-pay

## 2018-08-25 ENCOUNTER — Emergency Department
Admission: EM | Admit: 2018-08-25 | Discharge: 2018-08-25 | Disposition: A | Discharge status: Home / Self Care | Payer: Medicare Other | Attending: Emergency Medicine | Admitting: Emergency Medicine

## 2018-08-25 ENCOUNTER — Emergency Department: Payer: Medicare Other

## 2018-08-25 DIAGNOSIS — M5442 Lumbago with sciatica, left side: Secondary | ICD-10-CM | POA: Diagnosis not present

## 2018-08-25 DIAGNOSIS — I110 Hypertensive heart disease with heart failure: Secondary | ICD-10-CM | POA: Insufficient documentation

## 2018-08-25 DIAGNOSIS — I509 Heart failure, unspecified: Secondary | ICD-10-CM | POA: Insufficient documentation

## 2018-08-25 DIAGNOSIS — F17210 Nicotine dependence, cigarettes, uncomplicated: Secondary | ICD-10-CM | POA: Insufficient documentation

## 2018-08-25 DIAGNOSIS — M545 Low back pain: Secondary | ICD-10-CM | POA: Diagnosis present

## 2018-08-25 DIAGNOSIS — Z7982 Long term (current) use of aspirin: Secondary | ICD-10-CM | POA: Insufficient documentation

## 2018-08-25 DIAGNOSIS — Z79899 Other long term (current) drug therapy: Secondary | ICD-10-CM | POA: Insufficient documentation

## 2018-08-25 DIAGNOSIS — J449 Chronic obstructive pulmonary disease, unspecified: Secondary | ICD-10-CM | POA: Diagnosis not present

## 2018-08-25 MED ORDER — METHYLPREDNISOLONE SODIUM SUCC 125 MG IJ SOLR
125.0000 mg | Freq: Once | INTRAMUSCULAR | Status: AC
  Administered 2018-08-25: 125 mg via INTRAMUSCULAR
  Filled 2018-08-25: qty 2

## 2018-08-25 MED ORDER — MORPHINE SULFATE (PF) 4 MG/ML IV SOLN
4.0000 mg | Freq: Once | INTRAVENOUS | Status: AC
  Administered 2018-08-25: 4 mg via INTRAVENOUS

## 2018-08-25 MED ORDER — MORPHINE SULFATE (PF) 4 MG/ML IV SOLN
INTRAVENOUS | Status: AC
  Administered 2018-08-25: 4 mg via INTRAVENOUS
  Filled 2018-08-25: qty 1

## 2018-08-25 MED ORDER — METHOCARBAMOL 500 MG PO TABS: 500.0000 mg | ORAL_TABLET | Freq: Three times a day (TID) | ORAL | 0 refills | Status: AC | PRN

## 2018-08-25 MED ORDER — OXYCODONE-ACETAMINOPHEN 5-325 MG PO TABS
1.0000 | ORAL_TABLET | Freq: Once | ORAL | Status: AC
  Administered 2018-08-25: 1 via ORAL
  Filled 2018-08-25: qty 1

## 2018-08-25 MED ORDER — PREDNISONE 50 MG PO TABS: ORAL_TABLET | ORAL | 0 refills | Status: DC

## 2018-08-25 NOTE — ED Provider Notes (Signed)
[facility] Emergency Department Provider Note  ____________________________________________  Time seen: Approximately 11:54 PM  I have reviewed the triage vital signs and the nursing notes.   HISTORY  Chief Complaint Back Pain    HPI Mathew Reyes is a 77 y.o. male presents to the emergency department with acute left-sided low back pain with left lower extremity radiculopathy that has been present for approximately 1 week.  Patient has sought care with both urgent care and the orthopedic walk-in clinic and has been taking prednisone and meloxicam.  Patient reports that his symptoms seem to be worsening.  X-rays of the lumbar spine were conducted and did not reveal acute bony abnormality at prior encounters.  Patient reports that he has a numb and tingling sensation of left inner thigh but no bowel or bladder incontinence.  Patient reports that it is becoming progressively difficult for him to walk.    Past Medical History:  Diagnosis Date  . Abdominal hernia   . Anxiety   . Arthritis   . BPH (benign prostatic hyperplasia)   . Cardiomyopathy ([place])   . CHF (congestive heart failure) ([place])   . COPD (chronic obstructive pulmonary disease) ([place])   . Depression   . DOE (dyspnea on exertion)   . GERD (gastroesophageal reflux disease)   . Hypertension   . Lipidemia   . Psoriasis   . Situational anxiety   . SOB (shortness of breath)   . Tobacco abuse     Patient Active Problem List   Diagnosis Date Noted  . 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 Cardiomyopathy ([place]) 02/12/2016  . Congestive heart failure ([place]) 02/12/2016  . Breathlessness on exertion 02/12/2016  . HLD (hyperlipidemia) 02/12/2016  . BP (high blood pressure) 02/12/2016  . Breath shortness 02/12/2016  . 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 Shortness of breath 10/22/2015  . H/O disease 07/02/2014    Past Surgical History:   Procedure Laterality Date  . APPENDECTOMY    . COLONOSCOPY, ESOPHAGOGASTRODUODENOSCOPY (EGD) AND ESOPHAGEAL DILATION    . ESOPHAGOGASTRODUODENOSCOPY N/A 03/20/2015   Procedure: ESOPHAGOGASTRODUODENOSCOPY (EGD);  Surgeon: Hulen Luster, MD;  Location: [facility] ENDOSCOPY;  Service: Gastroenterology;  Laterality: N/A;  . ESOPHAGOGASTRODUODENOSCOPY N/A 08/28/2015   Procedure: ESOPHAGOGASTRODUODENOSCOPY (EGD);  Surgeon: Hulen Luster, MD;  Location: [facility] ENDOSCOPY;  Service: Gastroenterology;  Laterality: N/A;  . ESOPHAGOGASTRODUODENOSCOPY N/A 11/27/2015   Procedure: ESOPHAGOGASTRODUODENOSCOPY (EGD);  Surgeon: Hulen Luster, MD;  Location: [facility] ENDOSCOPY;  Service: Gastroenterology;  Laterality: N/A;  . Hemmorrhoidectomy    . HERNIA REPAIR    . Throid polyp      Prior to Admission medications   Medication Sig Start Date End Date Taking? Authorizing Provider  aspirin EC 325 MG tablet Take 325 mg by mouth daily.    [provider]  budesonide-formoterol (SYMBICORT) 160-4.5 MCG/ACT inhaler Inhale 2 puffs into the lungs 2 (two) times daily.    [provider]  carvedilol (COREG) 3.125 MG tablet Take 3.125 mg by mouth 2 (two) times daily.     [provider]  clobetasol ointment (TEMOVATE) 4.09 % APPLY 1 APPLICATION ON THE SKIN AS DIRECTED, TWICE A WEEK FOR 5 D...  (REFER TO PRESCRIPTION NOTES). 12/18/15   [provider]  digoxin (LANOXIN) 0.125 MG tablet Take 125 mcg by mouth daily.    [provider]  finasteride (PROSCAR) 5 MG tablet Take 1 tablet (5 mg total) by mouth daily. 08/02/16   Zara Council  A, PA-C  FLUoxetine (PROZAC) 20 MG capsule Take 20 mg by mouth daily.    [provider]  lisinopril (PRINIVIL,ZESTRIL) 5 MG tablet Take 5 mg by mouth daily.    [provider]  methocarbamol (ROBAXIN) 500 MG tablet Take 1 tablet (500 mg total) by mouth every 8 (eight) hours as needed for up to 5 days. 08/25/18 08/30/18  Lannie Fields, PA-C  Multiple  Vitamin (MULTIVITAMIN WITH MINERALS) TABS tablet Take 1 tablet by mouth daily.    [provider]  Omega-3 Fatty Acids (FISH OIL) 1000 MG CAPS Take 1,000 mg by mouth daily.    [provider]  omeprazole (PRILOSEC) 20 MG capsule Take by mouth. 01/26/16 01/25/17  [provider]  predniSONE (DELTASONE) 50 MG tablet Take one 50 mg tablet once daily for the next five days. 08/25/18   Lannie Fields, PA-C  sildenafil (VIAGRA) 100 MG tablet Take 100 mg by mouth as needed for erectile dysfunction.     [provider]  vitamin C (ASCORBIC ACID) 500 MG tablet Take 500 mg by mouth daily.    [provider]    Allergies Patient has no known allergies.  Family History  Problem Relation Age of Onset  . Alzheimer's disease Mother   . Aortic aneurysm Father   . Kidney disease Neg Hx   . Prostate cancer Neg Hx     Social History Social History   Tobacco Use  . Smoking status: Current Every Day Smoker    Packs/day: 1.50    Years: 50.00    Pack years: 75.00    Types: Cigarettes  . Smokeless tobacco: Never Used  Substance Use Topics  . Alcohol use: Yes    Alcohol/week: 0.0 standard drinks    Comment: socially  . Drug use: No     Review of Systems  Constitutional: No fever/chills Eyes: No visual changes. No discharge ENT: No upper respiratory complaints. Cardiovascular: no chest pain. Respiratory: no cough. No SOB. Gastrointestinal: No abdominal pain.  No nausea, no vomiting.  No diarrhea.  No constipation. Musculoskeletal: Patient has low back pain.  Skin: Negative for rash, abrasions, lacerations, ecchymosis. Neurological: Negative for headaches, focal weakness or numbness.   ____________________________________________   PHYSICAL EXAM:  VITAL SIGNS: ED Triage Vitals  Enc Vitals Group     BP 08/25/18 2245 (!) 143/99     Pulse Rate 08/25/18 2245 81     Resp 08/25/18 2245 18     Temp --      Temp src --      SpO2 08/25/18 2245 94  %     Weight 08/25/18 1856 182 lb (82.6 kg)     Height 08/25/18 1856 5\' 7"  (1.702 m)     Head Circumference --      Peak Flow --      Pain Score 08/25/18 1856 10     Pain Loc --      Pain Edu? --      Excl. in [place]? --      Constitutional: Alert and oriented. Well appearing and in no acute distress. Eyes: Conjunctivae are normal. PERRL. EOMI. Head: Atraumatic. Cardiovascular: Normal rate, regular rhythm. Normal S1 and S2.  Good peripheral circulation. Respiratory: Normal respiratory effort without tachypnea or retractions. Lungs CTAB. Good air entry to the bases with no decreased or absent breath sounds. Gastrointestinal: Bowel sounds 4 quadrants. Soft and nontender to palpation. No guarding or rigidity. No palpable masses. No distention. No CVA tenderness.  Musculoskeletal: Patient has 5 out of 5 strength in the upper and lower extremities bilaterally and symmetrically. Positive straight leg raise, left.  Neurologic:  Normal speech and language.  Patient has diminished sensation in L3 and L4 dermatomes, left. Skin:  Skin is warm, dry and intact. No rash noted. Psychiatric: Mood and affect are normal. Speech and behavior are normal. Patient exhibits appropriate insight and judgement.   ____________________________________________   LABS (all labs ordered are listed, but only abnormal results are displayed)  Labs Reviewed - No data to display ____________________________________________  EKG   ____________________________________________  RADIOLOGY I personally viewed and evaluated these images as part of my medical decision making, as well as reviewing the written report by the radiologist.  Mr Lumbar Spine Wo Contrast  Result Date: 08/25/2018 CLINICAL DATA:  Low back pain radiating to the left leg. EXAM: MRI LUMBAR SPINE WITHOUT CONTRAST TECHNIQUE: Multiplanar, multisequence MR imaging of the lumbar spine was performed. No intravenous contrast was administered. COMPARISON:   None. FINDINGS: Segmentation: Normal. The lowest disc space is considered to be L5-S1. Alignment:  Normal Vertebrae: Multilevel degenerative height loss without acute fracture. No discitis-osteomyelitis. Conus medullaris and cauda equina: The conus medullaris terminates at the T12-L1 level. The cauda equina and conus medullaris are both normal. Paraspinal and other soft tissues: Negative Disc levels: Sagittal plane imaging includes the T10-11 disc level through the upper sacrum, with axial imaging of the T12-S1 disc levels. T10-T11: Imaged only in the sagittal plane.  Normal. T11-T12: Mild facet hypertrophy.  No disc herniation or stenosis. T12-L1: Normal disc and facets.  No stenosis. L1-L2: Endplate ridging and small disc bulge. Normal facets. No central spinal canal stenosis. Mild bilateral neural foraminal stenosis. L2-L3: Small disc bulge and endplate ridging with mild facet hypertrophy, right greater than left. No spinal canal stenosis. Severe right and moderate left neural foraminal stenosis. L3-L4: Small disc bulge with mild facet hypertrophy. No spinal canal stenosis. Severe bilateral neural foraminal stenosis. L4-L5: Small central disc protrusion superimposed on small disc bulge. Endplate ridging and mild right, moderate left facet hypertrophy. No central spinal canal stenosis. Moderate bilateral neural foraminal stenosis. L5-S1: Moderate left facet hypertrophy. No spinal canal stenosis. Small disc bulge. Mild left foraminal stenosis. The visualized portion of the sacrum is normal. IMPRESSION: 1. 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 2. Moderate neural foraminal stenosis bilaterally at L4-5 and on the left at L2-3. 3. Diffuse degenerative disc disease and mild-to-moderate facet hypertrophy without spinal canal stenosis. Electronically Signed   By: Ulyses Jarred M.D.   On: 08/25/2018 22:43     ____________________________________________    PROCEDURES  Procedure(s) performed:    Procedures    Medications  oxyCODONE-acetaminophen (PERCOCET/ROXICET) 5-325 MG per tablet 1 tablet (1 tablet Oral Given 08/25/18 2041)  morphine 4 MG/ML injection 4 mg (4 mg Intravenous Given 08/25/18 2141)  methylPREDNISolone sodium succinate (SOLU-MEDROL) 125 mg/2 mL injection 125 mg (125 mg Intramuscular Given 08/25/18 2335)     ____________________________________________   INITIAL IMPRESSION / ASSESSMENT AND PLAN / ED COURSE  Pertinent labs & imaging results that were available during my care of the patient were reviewed by me and considered in my medical decision making (see chart for details).  Review of the  [place]RS was performed in accordance of the [place] prior to dispensing any controlled drugs.      Assessment and plan Low back pain Patient presents to the emergency department for low back pain for the third  time this week.  Patient reported that his back pain was progressing in intensity, affecting his ability to ambulate.  Patient was also developing new paresthesias along the left inner thigh.  Patient underwent MRI of the lumbar spine in the emergency department which revealed severe foraminal stenosis at L3-L4 but no spinal cord compression or disc herniation.  Patient was given Solu-Medrol in the emergency department and patient's prednisone dose was adjusted.  Patient was also discharged with Robaxin and referred to neurosurgery, Dr. Cari Caraway..  Vital signs are reassuring prior to discharge.  All patient questions were answered.    ____________________________________________  FINAL CLINICAL IMPRESSION(S) / ED DIAGNOSES  Final diagnoses:  Acute left-sided low back pain with left-sided sciatica      NEW MEDICATIONS STARTED DURING THIS VISIT:  ED Discharge Orders         Ordered    predniSONE (DELTASONE) 50 MG tablet     08/25/18 2324    methocarbamol  (ROBAXIN) 500 MG tablet  Every 8 hours PRN     08/25/18 2324              This chart was dictated using voice recognition software/Dragon. Despite best efforts to proofread, errors can occur which can change the meaning. Any change was purely unintentional.    Lannie Fields, PA-C 08/26/18 0016    Harvest Dark, MD 08/28/18 [phone]

## 2018-08-25 NOTE — ED Triage Notes (Signed)
First Nurse Note:  C/O low back pain.  Patient has appointment with Ortho at [facility] for 10/24 but cannot wait that long for appointment.

## 2018-08-25 NOTE — ED Triage Notes (Addendum)
Pt comes via POV with c/o lower back pain that shoots down his leg. Pt recently seen at [facility] and walk in clinic. Pt reports Mathew Reyes stated it could be sciatic. Pt put on prednisone and muscle relaxer with no relief. Pt states he may need a cortisol shot. Pt states unable to stand for longer than couple of minutes before he just gives out. Pt also states he hasn't been able to work because of the severe pain. Pt denies any recent falls or injuries.

## 2018-08-31 ENCOUNTER — Other Ambulatory Visit: Payer: Self-pay | Admitting: Orthopedic Surgery

## 2018-08-31 DIAGNOSIS — M5136 Other intervertebral disc degeneration, lumbar region: Secondary | ICD-10-CM

## 2018-09-04 ENCOUNTER — Ambulatory Visit
Admission: RE | Admit: 2018-09-04 | Discharge: 2018-09-04 | Disposition: A | Discharge status: Home / Self Care | Payer: BLUE CROSS/BLUE SHIELD | Source: Ambulatory Visit | Attending: Orthopedic Surgery | Admitting: Orthopedic Surgery

## 2018-09-04 DIAGNOSIS — M5136 Other intervertebral disc degeneration, lumbar region: Secondary | ICD-10-CM

## 2018-09-04 MED ORDER — METHYLPREDNISOLONE ACETATE 40 MG/ML INJ SUSP (RADIOLOG
120.0000 mg | Freq: Once | INTRAMUSCULAR | Status: AC
  Administered 2018-09-04: 120 mg via EPIDURAL

## 2018-09-04 MED ORDER — IOPAMIDOL (ISOVUE-M 200) INJECTION 41%
1.0000 mL | Freq: Once | INTRAMUSCULAR | Status: AC
  Administered 2018-09-04: 1 mL via EPIDURAL

## 2018-09-04 NOTE — Discharge Instructions (Signed)

## 2019-03-10 IMAGING — XA Imaging study
2 series · 2 of 2 positions shown · non-contrast
Comparison: none

CLINICAL DATA: Lumbosacral spondylosis without myelopathy.
LEFT-sided radicular symptoms.

[Series 1: ortho standard · 1 of 1 slices shown (1 of 2)]
[im 1/1]
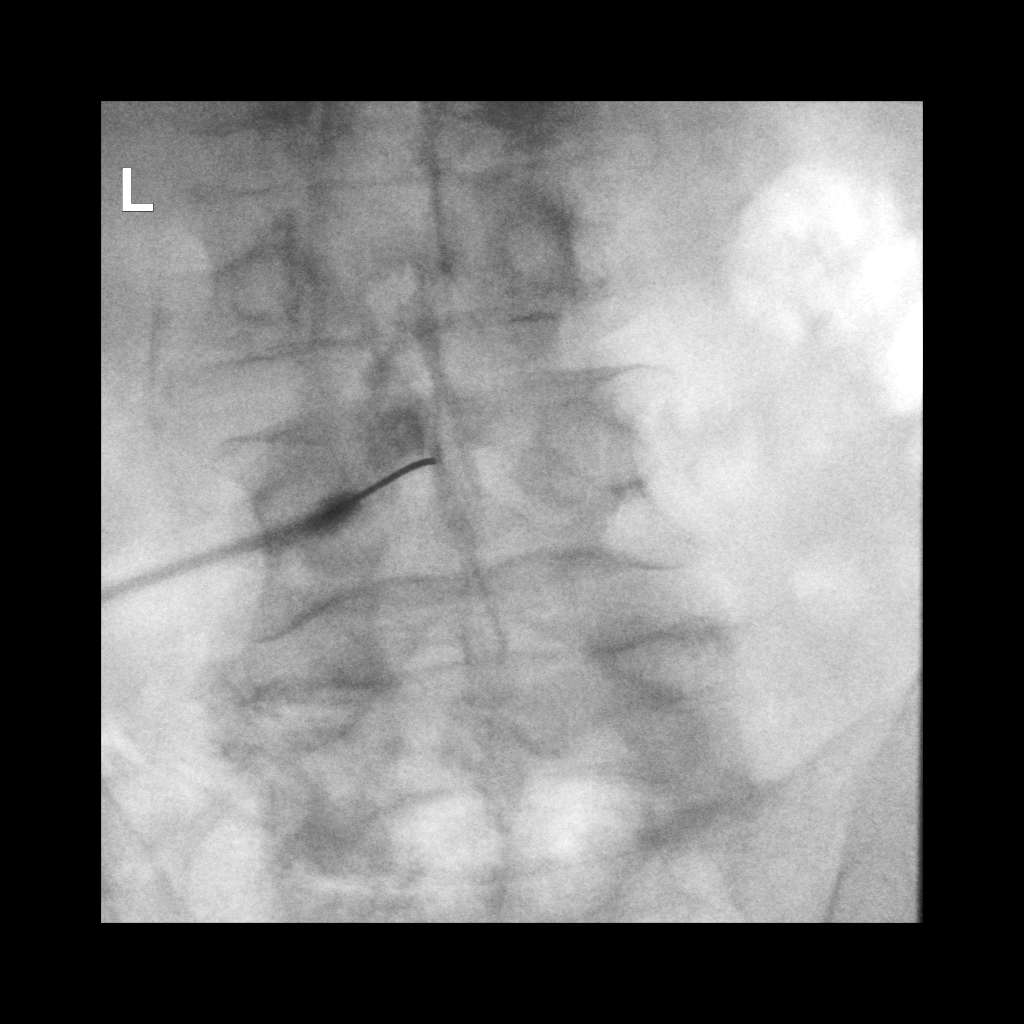

[Series 2: ortho standard · 1 of 1 slices shown (2 of 2)]
[im 1/1]
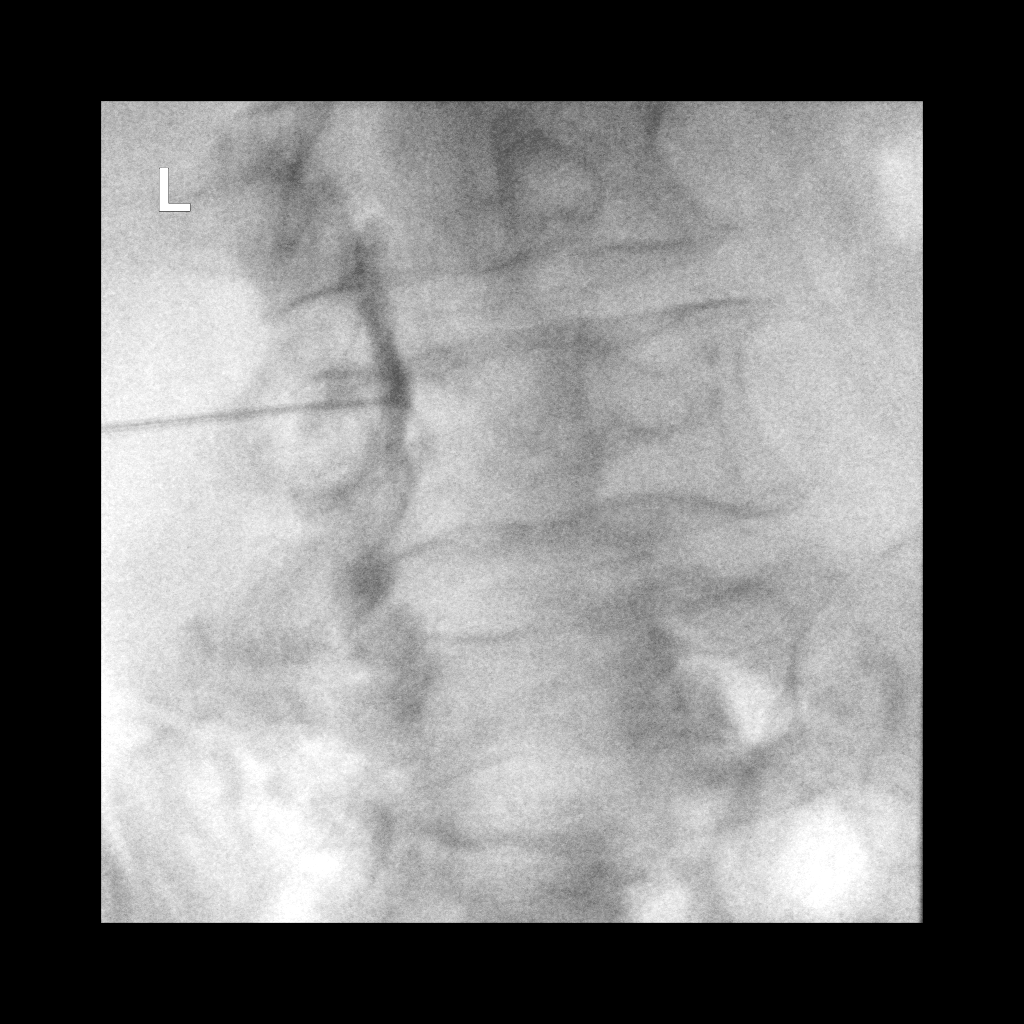

[2 of 2 positions shown; findings below may reference images not displayed]

FLUOROSCOPY TIME:  5 seconds corresponding to a Dose Area Product of
16.37 Gy*m2

PROCEDURE:
The procedure, risks, benefits, and alternatives were explained to
the patient. Questions regarding the procedure were encouraged and
answered. The patient understands and consents to the procedure.

LUMBAR EPIDURAL INJECTION:

An interlaminar approach was performed on LEFT at L3-L4. The
overlying skin was cleansed and anesthetized. A 20 gauge epidural
needle was advanced using loss-of-resistance technique.

DIAGNOSTIC EPIDURAL INJECTION:

Injection of Isovue-M 200 shows a good epidural pattern with spread
above and below the level of needle placement, primarily on the
LEFT; no vascular opacification is seen.

THERAPEUTIC EPIDURAL INJECTION:

120.0 mg of Depo-Medrol mixed with 2 mL 1% lidocaine were instilled.
The procedure was well-tolerated, and the patient was discharged
thirty minutes following the injection in good condition.

COMPLICATIONS:
None
IMPRESSION: Technically successful epidural injection on the LEFT L3-L4 # 1.

## 2019-03-12 ENCOUNTER — Ambulatory Visit: Admit: 2019-03-12 | Payer: Medicare Other | Admitting: Gastroenterology

## 2019-03-12 SURGERY — COLONOSCOPY WITH PROPOFOL
Anesthesia: General

## 2019-08-13 ENCOUNTER — Ambulatory Visit: Payer: Medicare Other | Admitting: Urology

## 2019-08-27 ENCOUNTER — Encounter: Payer: Self-pay | Admitting: Urology

## 2019-08-27 ENCOUNTER — Ambulatory Visit (INDEPENDENT_AMBULATORY_CARE_PROVIDER_SITE_OTHER): Payer: Medicare Other | Admitting: Urology

## 2019-08-27 ENCOUNTER — Other Ambulatory Visit: Payer: Self-pay

## 2019-08-27 VITALS — BP 157/74 | HR 76 | Ht 67.0 in | Wt 171.9 lb

## 2019-08-27 DIAGNOSIS — N401 Enlarged prostate with lower urinary tract symptoms: Secondary | ICD-10-CM

## 2019-08-27 DIAGNOSIS — R972 Elevated prostate specific antigen [PSA]: Secondary | ICD-10-CM

## 2019-08-28 ENCOUNTER — Encounter: Payer: Self-pay | Admitting: Urology

## 2019-08-28 LAB — PSA: Prostate Specific Ag, Serum: 5.4 ng/mL — ABNORMAL HIGH (ref 0.0–4.0)

## 2019-08-28 NOTE — Progress Notes (Signed)
08/27/2019 10:44 AM   [place] Aug 21, 1941 CS:[id number]  Referring provider: Idelle Crouch, MD [place] [facility] [place],  [place] [address]  Chief Complaint  Patient presents with  . Elevated PSA    HPI: 78 y.o. male referred by Dr. Doy Hutching for an elevated PSA.  He has a several year history of mild PSA elevation.  A PSA performed August 2020 was 7.71.  He was seen here 3 years ago and last saw [facility] September 2017.  He has mild lower urinary tract symptoms with his most bothersome symptoms being frequency, nocturia x2 and occasional episodes of urge incontinence.  He has been on tamsulosin and finasteride for several years.  Denies dysuria or gross hematuria.  PSA trend as follows:  01/2015  2.87 04/2016  4.73 04/2016  3.3 07/2016  2.0 05/2017  3.0 06/2018  2.92 06/2019  7.71   PMH: Past Medical History:  Diagnosis Date  . Abdominal hernia   . Anxiety   . Arthritis   . BPH (benign prostatic hyperplasia)   . Cardiomyopathy ([place])   . CHF (congestive heart failure) ([place])   . COPD (chronic obstructive pulmonary disease) ([place])   . Depression   . DOE (dyspnea on exertion)   . GERD (gastroesophageal reflux disease)   . Hypertension   . Lipidemia   . Psoriasis   . Situational anxiety   . SOB (shortness of breath)   . Tobacco abuse     Surgical History: Past Surgical History:  Procedure Laterality Date  . APPENDECTOMY    . COLONOSCOPY, ESOPHAGOGASTRODUODENOSCOPY (EGD) AND ESOPHAGEAL DILATION    . ESOPHAGOGASTRODUODENOSCOPY N/A 03/20/2015   Procedure: ESOPHAGOGASTRODUODENOSCOPY (EGD);  Surgeon: Hulen Luster, MD;  Location: [facility] ENDOSCOPY;  Service: Gastroenterology;  Laterality: N/A;  . ESOPHAGOGASTRODUODENOSCOPY N/A 08/28/2015   Procedure: ESOPHAGOGASTRODUODENOSCOPY (EGD);  Surgeon: Hulen Luster, MD;  Location: [facility] ENDOSCOPY;  Service: Gastroenterology;  Laterality: N/A;  . ESOPHAGOGASTRODUODENOSCOPY N/A 11/27/2015   Procedure:  ESOPHAGOGASTRODUODENOSCOPY (EGD);  Surgeon: Hulen Luster, MD;  Location: [facility] ENDOSCOPY;  Service: Gastroenterology;  Laterality: N/A;  . Hemmorrhoidectomy    . HERNIA REPAIR    . Throid polyp      Home Medications:  Allergies as of 08/27/2019   No Known Allergies     Medication List       Accurate as of August 27, 2019 11:59 PM. If you have any questions, ask your nurse or doctor.        STOP taking these medications   predniSONE 50 MG tablet Commonly known as: DELTASONE Stopped by: Abbie Sons, MD     TAKE these medications   aspirin EC 325 MG tablet Take 325 mg by mouth daily.   budesonide-formoterol 160-4.5 MCG/ACT inhaler Commonly known as: SYMBICORT Inhale 2 puffs into the lungs 2 (two) times daily.   calcipotriene-betamethasone ointment Commonly known as: TACLONEX Apply topically.   carvedilol 3.125 MG tablet Commonly known as: COREG Take 3.125 mg by mouth 2 (two) times daily.   clobetasol ointment 0.05 % Commonly known as: TEMOVATE APPLY 1 APPLICATION ON THE SKIN AS DIRECTED, TWICE A WEEK FOR 5 D...  (REFER TO PRESCRIPTION NOTES).   digoxin 0.125 MG tablet Commonly known as: LANOXIN Take 125 mcg by mouth daily.   finasteride 5 MG tablet Commonly known as: Proscar Take 1 tablet (5 mg total) by mouth daily.   Fish Oil 1000 MG Caps Take 1,000 mg by mouth daily.   OMEGA-3 FATTY ACIDS  PO Take by mouth.   FLUoxetine 20 MG capsule Commonly known as: PROZAC Take 20 mg by mouth daily.   gabapentin 300 MG capsule Commonly known as: NEURONTIN Take by mouth.   levalbuterol 45 MCG/ACT inhaler Commonly known as: XOPENEX HFA Inhale into the lungs.   lisinopril 5 MG tablet Commonly known as: ZESTRIL Take 5 mg by mouth daily.   montelukast 10 MG tablet Commonly known as: SINGULAIR TAKE 1 TABLET NIGHTLY   multivitamin with minerals Tabs tablet Take 1 tablet by mouth daily.   Na Sulfate-K Sulfate-Mg Sulf 17.5-3.13-1.6 GM/177ML Soln Take by  mouth.   omeprazole 20 MG capsule Commonly known as: PRILOSEC Take by mouth.   sildenafil 100 MG tablet Commonly known as: VIAGRA Take 100 mg by mouth as needed for erectile dysfunction.   tamsulosin 0.4 MG Caps capsule Commonly known as: FLOMAX Take by mouth.   vitamin C 500 MG tablet Commonly known as: ASCORBIC ACID Take 500 mg by mouth daily.       Allergies: No Known Allergies  Family History: Family History  Problem Relation Age of Onset  . Alzheimer's disease Mother   . Aortic aneurysm Father   . Kidney disease Neg Hx   . Prostate cancer Neg Hx     Social History:  reports that he has been smoking cigarettes. He has a 75.00 pack-year smoking history. He has never used smokeless tobacco. He reports current alcohol use. He reports that he does not use drugs.  ROS: UROLOGY Frequent Urination?: No Hard to postpone urination?: Yes Burning/pain with urination?: No Get up at night to urinate?: Yes Leakage of urine?: Yes Urine stream starts and stops?: No Trouble starting stream?: No Do you have to strain to urinate?: No Blood in urine?: No Urinary tract infection?: No Sexually transmitted disease?: No Injury to kidneys or bladder?: No Painful intercourse?: No Weak stream?: No Erection problems?: No Penile pain?: No  Gastrointestinal Nausea?: No Vomiting?: No Indigestion/heartburn?: No Diarrhea?: No Constipation?: No  Constitutional Fever: No Night sweats?: No Weight loss?: No Fatigue?: No  Skin Skin rash/lesions?: No Itching?: No  Eyes Blurred vision?: No Double vision?: No  Ears/Nose/Throat Sore throat?: No Sinus problems?: No  Hematologic/Lymphatic Swollen glands?: No Easy bruising?: No  Cardiovascular Leg swelling?: No Chest pain?: No  Respiratory Cough?: No Shortness of breath?: No  Endocrine Excessive thirst?: No  Musculoskeletal Back pain?: No Joint pain?: No  Neurological Headaches?: No Dizziness?: No   Psychologic Depression?: No Anxiety?: No  Physical Exam: BP (!) 157/74 (BP Location: Left Arm, Patient Position: Sitting, Cuff Size: Normal)   Pulse 76   Ht 5\' 7"  (1.702 m)   Wt 171 lb 14.4 oz (78 kg)   BMI 26.92 kg/m   Constitutional:  Alert and oriented, No acute distress. HEENT: [place] AT, moist mucus membranes.  Trachea midline, no masses. Cardiovascular: No clubbing, cyanosis, or edema. Respiratory: Normal respiratory effort, no increased work of breathing. GI: Abdomen is soft, nontender, nondistended, no abdominal masses GU: Prostate 40 g, smooth without nodules Lymph: No cervical or inguinal lymphadenopathy. Skin: No rashes, bruises or suspicious lesions. Neurologic: Grossly intact, no focal deficits, moving all 4 extremities. Psychiatric: Normal mood and affect.   Assessment & Plan:    - Elevated PSA Recent PSA significantly elevated above baseline.  I did discuss with Mr. Harbaugh that American Urologic Association prostate cancer screening guidelines do not recommend PSA screening after age 88. His PSA had been stable since 2018 until recently.  We did discuss  that low-grade prostate cancer is quite common in men in their late 38s and older.  Other potential causes were discussed including BPH and inflammation.  Would initially recommend repeating his PSA.  If it returns to baseline I would recommend stopping annual PSA checks.  If it remains elevated we discussed options of surveillance, prostate biopsy and MRI.  He would like to think over these options.  - BPH with lower urinary tract symptoms Stable voiding symptoms on tamsulosin and finasteride.   Abbie Sons, [place] [address] [place], [place] [address] [phone]

## 2019-08-29 ENCOUNTER — Telehealth: Payer: Self-pay | Admitting: Urology

## 2019-08-29 NOTE — Telephone Encounter (Signed)
error 

## 2019-08-29 NOTE — Telephone Encounter (Deleted)
PSA lower but still elevated above baseline.  Recommend a 30-day course of tamsulosin and repeat PSA 1 month.

## 2019-09-03 ENCOUNTER — Telehealth: Payer: Self-pay

## 2019-09-03 DIAGNOSIS — R972 Elevated prostate specific antigen [PSA]: Secondary | ICD-10-CM

## 2019-09-03 NOTE — Telephone Encounter (Signed)
Patient notified lab apt made lab order placed

## 2019-09-03 NOTE — Telephone Encounter (Signed)
-----   Message from Abbie Sons, MD sent at 09/02/2019  5:20 PM EDT ----- PSA is lower but still slightly elevated at 5.4.  Recommend lab visit/PSA 3 months.

## 2019-12-03 ENCOUNTER — Other Ambulatory Visit: Payer: Self-pay

## 2019-12-03 ENCOUNTER — Other Ambulatory Visit: Payer: Medicare Other

## 2019-12-03 DIAGNOSIS — R972 Elevated prostate specific antigen [PSA]: Secondary | ICD-10-CM

## 2019-12-04 ENCOUNTER — Telehealth: Payer: Self-pay | Admitting: Urology

## 2019-12-04 LAB — PSA: Prostate Specific Ag, Serum: 5.3 ng/mL — ABNORMAL HIGH (ref 0.0–4.0)

## 2019-12-04 NOTE — Telephone Encounter (Signed)
Repeat PSA remains elevated at 5.4.  At our last visit we discussed options if still elevated including prostate biopsy, prostate MRI or continued surveillance.  He was going to think over these options and let us know which way he would like to proceed.

## 2019-12-05 NOTE — Telephone Encounter (Signed)
Notified patient as instructed, patient pleased. Patient states he will call us when he has made up his mind on what to do.

## 2020-02-03 DIAGNOSIS — M1612 Unilateral primary osteoarthritis, left hip: Secondary | ICD-10-CM | POA: Insufficient documentation

## 2020-03-23 NOTE — Discharge Instructions (Signed)
Instructions after Total Hip Replacement     Novelle Addair P. Tzirel Leonor, [name], M.D.     Dept. of Orthopaedics & Sports Medicine  [facility]  [address]  [place], [place]  [address]  Phone: [phone]   Fax: [phone]    DIET: . Drink plenty of non-alcoholic fluids. . Resume your normal diet. Include foods high in fiber.  ACTIVITY:  . You may use crutches or a walker with weight-bearing as tolerated, unless instructed otherwise. . You may be weaned off of the walker or crutches by your Physical Therapist.  . Do NOT reach below the level of your knees or cross your legs until allowed.    . Continue doing gentle exercises. Exercising will reduce the pain and swelling, increase motion, and prevent muscle weakness.   . Please continue to use the TED compression stockings for 6 weeks. You may remove the stockings at night, but should reapply them in the morning. . Do not drive or operate any equipment until instructed.  WOUND CARE:  . Continue to use ice packs periodically to reduce pain and swelling. . Keep the incision clean and dry. . You may bathe or shower after the staples are removed at the first office visit following surgery.  MEDICATIONS: . You may resume your regular medications. . Please take the pain medication as prescribed on the medication. . Do not take pain medication on an empty stomach. . You have been given a prescription for a blood thinner to prevent blood clots. Please take the medication as instructed. (NOTE: After completing a 2 week course of Lovenox, take one Enteric-coated aspirin once a day.) . Pain medications and iron supplements can cause constipation. Use a stool softener (Senokot or Colace) on a daily basis and a laxative (dulcolax or miralax) as needed. . Do not drive or drink alcoholic beverages when taking pain medications.  CALL THE OFFICE FOR: . Temperature above 101 degrees . Excessive bleeding or drainage on the dressing. . Excessive  swelling, coldness, or paleness of the toes. . Persistent nausea and vomiting.  FOLLOW-UP:  . You should have an appointment to return to the office in 6 weeks after surgery. . Arrangements have been made for continuation of Physical Therapy (either home therapy or outpatient therapy).     [facility] Department Directory         [email or web address]       [email or web address]          Cardiology  Appointments: [place] - [phone] [place] - [phone]  Endocrinology  Appointments: [place] - [phone] [place] - [phone]  Gastroenterology  Appointments: [place] - [phone] [place] - [phone]        General Surgery   Appointments: [place] - [phone]  Internal Medicine/Family Medicine  Appointments: [place] - [phone] [place] - [phone] [place] - [phone]  Metabolic and Weigh Loss Surgery  Appointments: [place] - [phone]        Neurology  Appointments: [place] - [phone] [place] - [phone]  Neurosurgery  Appointments: [place] - [phone]  Obstetrics & Gynecology  Appointments: [place] - [phone] [place] - [phone]        Pediatrics  Appointments: [place] - [phone] [place] - [phone]  Physiatry  Appointments: [place] [phone]  Physical Therapy  Appointments: [place] - [phone] [place] - [phone]        Podiatry  Appointments: [place] - [phone] [place] - [phone]  Pulmonology  Appointments: [place] - [phone]  Rheumatology  Appointments:  - [phone]         Location: [facility]  [address] [place], [place]  [address]  [place] Location: [facility] [address] [place], [place]  [address]  [place] Location: [facility] [address] [place], [place]  [address]    

## 2020-04-04 ENCOUNTER — Encounter
Admission: RE | Admit: 2020-04-04 | Discharge: 2020-04-04 | Disposition: A | Discharge status: Home / Self Care | Payer: Medicare Other | Source: Ambulatory Visit | Attending: Orthopedic Surgery | Admitting: Orthopedic Surgery

## 2020-04-04 ENCOUNTER — Other Ambulatory Visit: Payer: Self-pay

## 2020-04-04 DIAGNOSIS — Z01818 Encounter for other preprocedural examination: Secondary | ICD-10-CM | POA: Insufficient documentation

## 2020-04-04 HISTORY — DX: Dyspnea, unspecified: R06.00

## 2020-04-04 NOTE — Pre-Procedure Instructions (Signed)
Echo complete4/06/2020 [place] Component Name Value Ref Range  LV Ejection Fraction (%) 40   Aortic Valve Regurgitation Grade trivial   Aortic Valve Stenosis Grade none   Aortic Valve Max Velocity (m/s) 1.9 m/sec  Aortic Valve Stenosis Mean Gradient (mmHg) 7.1 mmHg  Mitral Valve Regurgitation Grade mild   Mitral Valve Stenosis Grade none   Tricuspid Valve Regurgitation Grade mild   Tricuspid Valve Regurgitation Max Velocity (m/s) 2.8 m/sec  Right Ventricle Systolic Pressure (mmHg) A999333 mmHg  LV End Diastolic Diameter (cm) 5.7 cm  LV End Systolic Diameter (cm) 4.4 cm  LV Septum Wall Thickness (cm) 1.6 cm  LV Posterior Wall Thickness (cm) 1.1 cm  Left Atrium Diameter (cm) 4 cm  Result Narrative              CARDIOLOGY DEPARTMENT         [facility]                  [id number]      [place] #: [id number]      [address], [place], [place] [address]    Date: 02/14/2020 03: 08 PM                                Adult  Male Age: 71 yrs      ECHOCARDIOGRAM REPORT               Outpatient                                [name]    STUDY:CHEST WALL        TAPE:0000: 00: 0: 00: 00 MD1: Reyes, Mathew MARIA    ECHO:Yes  DOPPLER:Yes    FILE:0000-000-000    BP: 142/86 mmHg    COLOR:Yes  CONTRAST:No   MACHINE:Philips  RV BIOPSY:No     3D:No SOUND QLTY:Moderate      Height: 67 in   MEDIUM:None                       Weight: 171 lb                                BSA: 1.9 m2 _________________________________________________________________________________________        HISTORY: CHF, Cardiomyopathy         REASON: Assess, LV function       INDICATION:  [id number] Chronic systolic (congestive) heart failure, I42.9             Cardiomyopathy, unspecified type _________________________________________________________________________________________ ECHOCARDIOGRAPHIC MEASUREMENTS 2D DIMENSIONS AORTA         Values  Normal Range  MAIN PA     Values  Normal Range        Annulus: nm*     [2.3-2.9]     PA Main: nm*    [1.5-2.1]       Aorta Sin: nm*     [3.1-3.7]  RIGHT VENTRICLE      ST Junction: nm*     [2.6-3.2]     RV Base: 4.8 cm  [< 4.2]       Asc.Aorta: nm*     [  2.6-3.4]     RV Mid: nm*    [<3.5] LEFT VENTRICLE                   RV Length: nm*    [<8.6]         LVIDd: 5.7 cm    [4.2-5.9]  INFERIOR VENA CAVA         LVIDs: 4.4 cm            Max. IVC: nm*    [<=2.1]           FS: 22.4 %    [>25]      Min. IVC: nm*          SWT: 1.6 cm    [0.6-1.0]  ------------------          PWT: 1.1 cm    [0.6-1.0]  nm* - not measured LEFT ATRIUM        LA Diam: 4.0 cm    [3.0-4.0]      LA A4C Area: nm*     [<20]       LA Volume: nm*     [18-58] _________________________________________________________________________________________ ECHOCARDIOGRAPHIC DESCRIPTIONS AORTIC ROOT          Size: Normal       Dissection: INDETERM FOR DISSECTION AORTIC VALVE        Leaflets: Tricuspid          Morphology: MILDLY THICKENED        Mobility: Fully mobile LEFT VENTRICLE          Size: Normal            Anterior: Normal      Contraction: REGIONALLY IMPAIRED      Lateral: Normal       Closest EF: 40% (Estimated)         Septal: HYPOCONTRACTILE       LV Masses: No Masses            Apical: Normal          LVH: MODERATE LVH CONCENTRIC     Inferior: Normal                           Posterior: Normal      Dias.FxClass: (Grade 1) relaxation abnormal, E/A reversal MITRAL VALVE        Leaflets: Normal            Mobility: Fully mobile       Morphology: Normal LEFT ATRIUM          Size: MILDLY ENLARGED       LA Masses: No masses       IA Septum: Normal IAS MAIN PA          Size: Normal PULMONIC VALVE       Morphology: Normal            Mobility: Fully mobile RIGHT VENTRICLE          Size: MILDLY ENLARGED       Free Wall: Normal      Contraction: Normal            RV Masses: No Masses TRICUSPID VALVE        Leaflets: Normal            Mobility: Fully mobile       Morphology: Normal RIGHT ATRIUM          Size: MILDLY ENLARGED  RA Other: None        RA Mass: No masses PERICARDIUM         Fluid: No effusion INFERIOR VENACAVA          Size: Normal Normal respiratory collapse _________________________________________________________________________________________  DOPPLER ECHO and OTHER SPECIAL PROCEDURES         Aortic: TRIVIAL AR         No AS             188.0 cm/sec peak vel   14.1 mmHg peak grad             7.1 mmHg mean grad     2.9 cm^2 by DOPPLER         Mitral: MILD MR          No MS             MV Inflow E Vel = 73.7 cm/sec   MV Annulus E'Vel = nm*             E/E'Ratio = nm*       Tricuspid: MILD TR          No TS             275.5 cm/sec peak TR vel  33.4 mmHg peak RV pressure       Pulmonary: TRIVIAL PR         No PS _________________________________________________________________________________________ INTERPRETATION MILD SEGMENTAL LV SYSTOLIC DYSFUNCTION WITH AN ESTIMATED EF = 40  % NORMAL RIGHT VENTRICULAR SYSTOLIC FUNCTION MILD TRICUSPID AND MITRAL VALVE INSUFFICIENCY TRACE AORTIC VALVE INSUFFICIENCY NO VALVULAR STENOSIS MILD RV ENLARGEMENT MILD BIATRIAL ENLARGEMENT MODERATE LVH _________________________________________________________________________________________ Electronically signed by  Mathew Amel, MD on 02/14/2020 03: 54 PM      Performed By: Mathew Reyes, RDCS, RVT   Ordering Physician: Mathew Reyes _________________________________________________________________________________________  Other Result Information  Interface, Text Results In - 02/14/2020  3:55 PM EDT Formatting of this note might be different from the original.                        [place], [place]                                    [id number]           [place] #: [id number]           [address] [name], [place] [address]       Date: 02/14/2020 03: 08 PM                                                              Adult   Male  Age: 79 yrs           ECHOCARDIOGRAM REPORT  Outpatient                                                              [facility]      STUDY:CHEST WALL               TAPE:0000: 00: 0: 00: 00 MD1: Reyes, Mathew MARIA       ECHO:Yes    DOPPLER:Yes       FILE:0000-000-000        BP: 142/86 mmHg      COLOR:Yes   CONTRAST:No     MACHINE:Philips  RV BIOPSY:No          3D:No  SOUND QLTY:Moderate            Height: 67 in     MEDIUM:None                                              Weight: 171 lb                                                              BSA: 1.9 m2 _________________________________________________________________________________________               HISTORY: CHF, Cardiomyopathy                REASON: Assess, LV function            INDICATION: [id number] Chronic systolic (congestive) heart  failure, I42.9                        Cardiomyopathy, unspecified type _________________________________________________________________________________________ ECHOCARDIOGRAPHIC MEASUREMENTS 2D DIMENSIONS AORTA                  Values   Normal Range   MAIN PA         Values    Normal Range               Annulus: nm*          [2.3-2.9]         PA Main: nm*       [1.5-2.1]             Aorta Sin: nm*          [3.1-3.7]    RIGHT VENTRICLE           ST Junction: nm*          [2.6-3.2]         RV Base: 4.8 cm    [< 4.2]             Asc.Aorta: nm*          [2.6-3.4]          RV Mid: nm*       [<3.5] LEFT VENTRICLE  RV Length: nm*       [<8.6]                 LVIDd: 5.7 cm       [4.2-5.9]    INFERIOR VENA CAVA                 LVIDs: 4.4 cm                        Max. IVC: nm*       [<=2.1]                    FS: 22.4 %       [>25]            Min. IVC: nm*                   SWT: 1.6 cm       [0.6-1.0]    ------------------                   PWT: 1.1 cm       [0.6-1.0]    nm* - not measured LEFT ATRIUM               LA Diam: 4.0 cm       [3.0-4.0]           LA A4C Area: nm*          [<20]             LA Volume: nm*          [18-58] _________________________________________________________________________________________ ECHOCARDIOGRAPHIC DESCRIPTIONS AORTIC ROOT                  Size: Normal            Dissection: INDETERM FOR DISSECTION AORTIC VALVE              Leaflets: Tricuspid                   Morphology: MILDLY THICKENED              Mobility: Fully mobile LEFT VENTRICLE                  Size: Normal                        Anterior: Normal           Contraction: REGIONALLY IMPAIRED            Lateral: Normal            Closest EF: 40% (Estimated)                 Septal: HYPOCONTRACTILE             LV Masses: No Masses                       Apical: Normal                   LVH: MODERATE LVH CONCENTRIC       Inferior: Normal  Posterior: Normal          Dias.FxClass: (Grade 1) relaxation abnormal, E/A reversal MITRAL VALVE              Leaflets: Normal                        Mobility: Fully mobile            Morphology: Normal LEFT ATRIUM                  Size: MILDLY ENLARGED              LA Masses: No masses             IA Septum: Normal IAS MAIN PA                  Size: Normal PULMONIC VALVE            Morphology: Normal                        Mobility: Fully mobile RIGHT VENTRICLE                  Size: MILDLY ENLARGED              Free Wall: Normal           Contraction: Normal                       RV Masses: No Masses TRICUSPID VALVE              Leaflets: Normal                        Mobility: Fully mobile            Morphology: Normal RIGHT ATRIUM                  Size: MILDLY ENLARGED               RA Other: None               RA Mass: No masses PERICARDIUM                 Fluid: No effusion INFERIOR VENACAVA                  Size: Normal Normal respiratory collapse _________________________________________________________________________________________  DOPPLER ECHO and OTHER SPECIAL PROCEDURES                Aortic: TRIVIAL AR                 No AS                        188.0 cm/sec peak vel      14.1 mmHg peak grad                        7.1 mmHg mean grad         2.9 cm^2 by DOPPLER                Mitral: MILD MR                    No MS  MV Inflow E Vel = 73.7 cm/sec      MV Annulus E'Vel = nm*                        E/E'Ratio = nm*             Tricuspid: MILD TR                    No TS                        275.5 cm/sec peak TR vel   33.4 mmHg peak RV pressure             Pulmonary: TRIVIAL PR                 No PS _________________________________________________________________________________________ INTERPRETATION MILD SEGMENTAL LV SYSTOLIC DYSFUNCTION WITH AN ESTIMATED EF = 40 % NORMAL RIGHT VENTRICULAR SYSTOLIC  FUNCTION MILD TRICUSPID AND MITRAL VALVE INSUFFICIENCY TRACE AORTIC VALVE INSUFFICIENCY NO VALVULAR STENOSIS MILD RV ENLARGEMENT MILD BIATRIAL ENLARGEMENT MODERATE LVH _________________________________________________________________________________________ Electronically signed by    Mathew Amel, MD on 02/14/2020 03: 54 PM          Performed By: Mathew Reyes, RDCS, RVT    Ordering Physician: Mathew Reyes _________________________________________________________________________________________  Status Results Details

## 2020-04-04 NOTE — Patient Instructions (Signed)
INSTRUCTIONS FOR SURGERY     Your surgery is scheduled for:   Monday, June 7TH     To find out your arrival time for the day of surgery,          please call [phone] between 1 pm and 3 pm on :  Friday, June 4TH     When you arrive for surgery, report to the [facility].       Do NOT stop on the first floor to register.    REMEMBER: Instructions that are not followed completely may result in serious medical risk,  up to and including death, or upon the discretion of your surgeon and anesthesiologist,            your surgery may need to be rescheduled.  __X__ 1. Do not eat food after midnight the night before your procedure.                    No gum, candy, lozenger, tic tacs, tums or hard candies.                  ABSOLUTELY NOTHING SOLID IN YOUR MOUTH AFTER MIDNIGHT                    You may drink unlimited clear liquids up to 2 hours before you are scheduled to arrive for surgery.                   Do not drink anything within those 2 hours unless you need to take medicine, then take the                   smallest amount you need.  Clear liquids include:  water, apple juice without pulp,                   any flavor Gatorade, Black coffee, black tea.  Sugar may be added but no dairy/ honey /lemon.                        Broth and jello is not considered a clear liquid.  __x__  2. On the morning of surgery, please brush your teeth with toothpaste and water. You may rinse with                  mouthwash if you wish but DO NOT SWALLOW TOOTHPASTE OR MOUTHWASH  __X___3. NO alcohol for 24 hours before or after surgery.  __x___ 4.  Do NOT smoke or use e-cigarettes for 24 HOURS PRIOR TO SURGERY.                      DO NOT Use any chewable tobacco products for at least 6 hours prior to surgery.  __x___ 5. If you start any new medication after this appointment and prior to surgery, please  Bring it with you on the day of surgery.  ___x__ 6. Notify your doctor if there is any change in your medical condition, such as fever, infection, vomitting,  Diarrhea or any open sores.  __x___ 7.  USE the CHG SOAP as instructed, the night before surgery and the day of surgery.                   Once you have washed with this soap, do NOT use any of the following: Powders, perfumes                    or lotions. Please do not wear make up, hairpins, clips or nail polish. You MAY wear deodorant.                   Men may shave their face and neck.  Women need to shave 48 hours prior to surgery.                   DO NOT wear ANY jewelry on the day of surgery. If there are rings that are too tight to                    remove easily, please address this prior to the surgery day. Piercings need to be removed.                                                                     NO METAL ON YOUR BODY.                    Do NOT bring any valuables.  If you came to Pre-Admit testing then you will not need license,                     insurance card or credit card.  If you will be staying overnight, please either leave your things in                     the car or have your family be responsible for these items.                     [facility] IS NOT RESPONSIBLE FOR BELONGINGS OR VALUABLES.  ___X__ 8. DO NOT wear contact lenses on surgery day.  You may not have dentures,                     Hearing aides, contacts or glasses in the operating room. These items can be                    Placed in the Recovery Room to receive immediately after surgery.  ___x__ 10. Take the following medications on the morning of surgery with a sip of water:                              1.SYMBICORT                     2.CARVEDILOL                     3.DIGOXIN                     4.PROSCAR  5.PRILOSEC                     6.FLOMAX  __X___ 11.  Follow any instructions provided to  you by your surgeon.                        Such as enema, clear liquid bowel prep                      ##PLEASE CONSUME THE PRESURGICAL CARBOHYDRATE DRINK ON THE                       DAY OF SURGERY.  HAVE IT COMPLETED 2 HOURS PRIOR TO ARRIVAL                        TO [facility].##  __X__  12. STOP ASPIRIN AND ALL ASPIRIN PRODUCTS AS OF: June 1ST (1 week prior)                       THIS INCLUDES BC POWDERS / GOODIES POWDER  __x___ 13. STOP Anti-inflammatories as of: ONE WEEK PRIOR TO SURGERY (June 1ST)                      This includes IBUPROFEN / MOTRIN / ADVIL / ALEVE/ NAPROXYN                    YOU MAY TAKE TYLENOL ANY TIME PRIOR TO SURGERY.  __X___ 14.  Stop supplements until after surgery.                     This includes: OSCAL // MULTIVITAMINS // OMEGA 3 FISH OIL // VITAMIN C //                        [place]  ___X___17.  Continue to take the following medications but do not take on the morning of surgery:                           ENTRESTO  __X____18. If staying overnight, please have appropriate shoes to wear to be able to walk around the unit.                   Wear clean and comfortable clothing to the hospital.  PLEASE BRING A LIST OF PHONE NUMBERS FOR YOUR CONTACT PEOPLE. IF BRINGING A CELL PHONE, [place].

## 2020-04-08 ENCOUNTER — Encounter
Admission: RE | Admit: 2020-04-08 | Discharge: 2020-04-08 | Disposition: A | Discharge status: Home / Self Care | Payer: Medicare Other | Source: Ambulatory Visit | Attending: Orthopedic Surgery | Admitting: Orthopedic Surgery

## 2020-04-08 ENCOUNTER — Other Ambulatory Visit: Payer: Self-pay

## 2020-04-08 DIAGNOSIS — Z01818 Encounter for other preprocedural examination: Secondary | ICD-10-CM | POA: Insufficient documentation

## 2020-04-08 LAB — URINALYSIS, ROUTINE W REFLEX MICROSCOPIC
Bilirubin Urine: NEGATIVE
Glucose, UA: NEGATIVE mg/dL
Hgb urine dipstick: NEGATIVE
Ketones, ur: NEGATIVE mg/dL
Leukocytes,Ua: NEGATIVE
Nitrite: NEGATIVE
Protein, ur: NEGATIVE mg/dL
Specific Gravity, Urine: 1.028 (ref 1.005–1.030)
pH: 5 (ref 5.0–8.0)

## 2020-04-08 LAB — C-REACTIVE PROTEIN: CRP: 0.5 mg/dL (ref <1.0)

## 2020-04-08 LAB — COMPREHENSIVE METABOLIC PANEL
ALT: 15 U/L (ref 0–44)
AST: 20 U/L (ref 15–41)
Albumin: 4.1 g/dL (ref 3.5–5.0)
Alkaline Phosphatase: 50 U/L (ref 38–126)
Anion gap: 9 (ref 5–15)
BUN: 19 mg/dL (ref 8–23)
CO2: 27 mmol/L (ref 22–32)
Calcium: 9 mg/dL (ref 8.9–10.3)
Chloride: 104 mmol/L (ref 98–111)
Creatinine, Ser: 0.94 mg/dL (ref 0.61–1.24)
GFR calc Af Amer: >60 mL/min (ref >60)
GFR calc non Af Amer: >60 mL/min (ref >60)
Glucose, Bld: 113 mg/dL — ABNORMAL HIGH (ref 70–99)
Potassium: 4.2 mmol/L (ref 3.5–5.1)
Sodium: 140 mmol/L (ref 135–145)
Total Bilirubin: 0.9 mg/dL (ref 0.3–1.2)
Total Protein: 7 g/dL (ref 6.5–8.1)

## 2020-04-08 LAB — PROTIME-INR
INR: 0.9 (ref 0.8–1.2)
Prothrombin Time: 12.2 seconds (ref 11.4–15.2)

## 2020-04-08 LAB — CBC WITH DIFFERENTIAL/PLATELET
Abs Immature Granulocytes: 0.03 10*3/uL (ref 0.00–0.07)
Basophils Absolute: 0.1 10*3/uL (ref 0.0–0.1)
Basophils Relative: 1 %
Eosinophils Absolute: 0.2 10*3/uL (ref 0.0–0.5)
Eosinophils Relative: 2 %
HCT: 44.9 % (ref 39.0–52.0)
Hemoglobin: 14.9 g/dL (ref 13.0–17.0)
Immature Granulocytes: 0 %
Lymphocytes Relative: 16 %
Lymphs Abs: 1.4 10*3/uL (ref 0.7–4.0)
MCH: 30.7 pg (ref 26.0–34.0)
MCHC: 33.2 g/dL (ref 30.0–36.0)
MCV: 92.6 fL (ref 80.0–100.0)
Monocytes Absolute: 0.7 10*3/uL (ref 0.1–1.0)
Monocytes Relative: 8 %
Neutro Abs: 6.3 10*3/uL (ref 1.7–7.7)
Neutrophils Relative %: 73 %
Platelets: 206 10*3/uL (ref 150–400)
RBC: 4.85 MIL/uL (ref 4.22–5.81)
RDW: 13.2 % (ref 11.5–15.5)
WBC: 8.6 10*3/uL (ref 4.0–10.5)
nRBC: 0 % (ref 0.0–0.2)

## 2020-04-08 LAB — TYPE AND SCREEN
ABO/RH(D): O POS
Antibody Screen: NEGATIVE

## 2020-04-08 LAB — SEDIMENTATION RATE: Sed Rate: 7 mm/hr (ref 0–20)

## 2020-04-08 LAB — APTT: aPTT: 35 seconds (ref 24–36)

## 2020-04-08 LAB — SURGICAL PCR SCREEN
MRSA, PCR: NEGATIVE
Staphylococcus aureus: NEGATIVE

## 2020-04-08 NOTE — Pre-Procedure Instructions (Signed)
Pt was already cleared for this surgery in April by [facility] and was a moderate risk. 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 Sent this EKG to [facility] to review to make sure pt is still ok for surgery. Dr Clayborn Bigness said pt is still cleared for surgery and is moderate risk. Note on chart

## 2020-04-09 LAB — URINE CULTURE
Culture: NO GROWTH
Special Requests: NORMAL

## 2020-04-10 ENCOUNTER — Other Ambulatory Visit
Admission: RE | Admit: 2020-04-10 | Discharge: 2020-04-10 | Disposition: A | Discharge status: Home / Self Care | Payer: Medicare Other | Source: Ambulatory Visit | Attending: Orthopedic Surgery | Admitting: Orthopedic Surgery

## 2020-04-10 ENCOUNTER — Other Ambulatory Visit: Payer: Self-pay

## 2020-04-10 DIAGNOSIS — Z01812 Encounter for preprocedural laboratory examination: Secondary | ICD-10-CM | POA: Diagnosis present

## 2020-04-10 DIAGNOSIS — Z20822 Contact with and (suspected) exposure to covid-19: Secondary | ICD-10-CM | POA: Diagnosis not present

## 2020-04-10 LAB — SARS CORONAVIRUS 2 (TAT 6-24 HRS): SARS Coronavirus 2: NEGATIVE

## 2020-04-13 ENCOUNTER — Encounter: Payer: Self-pay | Admitting: Orthopedic Surgery

## 2020-04-13 NOTE — H&P (Signed)
ORTHOPAEDIC HISTORY & PHYSICAL  Gwenlyn Fudge, [place] - 04/08/2020 8:15 AM EDT [facility] Chief Complaint:   Chief Complaint  Patient presents with  . Hip Pain  H & P LEFT HIP   History of Present Illness:   Mathew Reyes is a 79 y.o. male that presents to clinic today for his preoperative history and evaluation. Patient presents with his wife. The patient is scheduled to undergo a left total hip arthroplasty on 04/14/20 by Dr. Marry Guan. His pain began approximately 5 years ago. The pain is located in the left hip and groin. He describes his pain as worse with weightbearing and rotation of the hip. He reports associated difficulty putting on and removing his socks and walking extended distances. He denies associated numbness or tingling.   The patient's symptoms have progressed to the point that they decrease his quality of life. The patient has previously undergone conservative treatment including NSAIDS and activity modification without adequate control of his symptoms.  Patient denies history of blood clots or history of lumbar surgery. Patient does see a cardiologist but has been cleared for surgery. 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 He has appreciated improvement of those symptoms following epidural steroid injection.   Past Medical, Surgical, Family, Social History, Allergies, Medications:   Past Medical History:  Past Medical History:  Diagnosis Date  . Abdominal hernia  . Arthritis  . 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 BPH (benign prostatic hypertrophy)  . Cardiomyopathy (CMS-HCC)  . CHF (congestive heart failure) (CMS-HCC)  . COPD (chronic obstructive pulmonary disease) (CMS-HCC)  . DDD (degenerative disc disease), lumbar 11/03/2015  . Depression  mild  . DOE (dyspnea on exertion)  . GERD (gastroesophageal reflux disease)  .  Hyperlipidemia  . Hypertension  controlled with diet and exercise  . Psoriasis  . Situational anxiety  . SOB (shortness of breath)  . Tobacco abuse   Past Surgical History:  Past Surgical History:  Procedure Laterality Date  . APPENDECTOMY 1952  . COLONOSCOPY 11/28/03; 04/11/08  Barrett's-AHMUS  . COLONOSCOPY  . COLONOSCOPY 08/15/2014 [facility]  Personal hx colonic polyps/ Repeat 5 years/ [facility]  . EGD 11/28/03; 01/04/06; 01/11/07; 06/16/10  +Barrett's  . EGD 08/15/2014 [facility]  Long Segment Barrett's/ Repeat w Barryx 09/12/14) [facility]  . EGD 09/12/2014  Repeat 4months for Baryx/to repeat 11/14/2014/[facility]  . EGD w/ Barrx 01/23/2015  Changes consistent with Barrett's/Treated with radiofrequency ablation./Repeat 65months/[facility]  . EGD w/ Barrx 03/20/2015  Barrett's/Repeat 2-3 months/[facility]  . EGD w/ Barrx 08/28/2015  Changes consistent with Barrett's/Treated with radiofrequency ablation/Repeat 109months/[facility]  . EGD with BARRX 11/27/2015  Barrett's treated with BARRX/Repeat 2 to 3 months with Barrx/[facility]  . HEMORRHOIDECTOMY BY SIMPLE LIGATION  . HERNIA REPAIR  . POLYPECTOMY  Polyp removed from throat  . TONSILLECTOMY  One tonsil removed  . UPPER GASTROINTESTINAL ENDOSCOPY   Current Medications:  Current Outpatient Medications  Medication Sig Dispense Refill  . ascorbic acid (VITAMIN C) 500 MG tablet Take 500 mg by mouth once daily.  Marland Kitchen aspirin 81 MG EC tablet Take 1 tablet (81 mg total) by mouth once daily 30 tablet 11  . calcipotriene-betamethasone (TACLONEX) 0.005-0.064 % ointment Apply topically once daily 100 g 3  . calcium carbonate 600 mg calcium (1,500 mg) Tab tablet Take 600 mg by mouth once daily  . carvediloL (COREG) 3.125 MG tablet TAKE 1 TABLET TWICE A  DAY 180 tablet 3  . digoxin (LANOXIN) 0.125 MG tablet TAKE 1 TABLET DAILY 90 tablet 3  . finasteride (PROSCAR) 5 mg tablet Take 1 tablet (5 mg total) by mouth once daily 90 tablet 1  . FLUoxetine (PROZAC) 20 MG capsule Take 20 mg by mouth once daily.  Marland Kitchen  levalbuterol (XOPENEX HFA) inhaler USE 2 INHALATIONS EVERY 4 HOURS 45 g 6  . montelukast (SINGULAIR) 10 mg tablet TAKE 1 TABLET NIGHTLY 90 tablet 3  . naproxen sodium (ALEVE) 220 MG tablet Take 440 mg by mouth 2 (two) times daily as needed  . omega-3 fatty acids/fish oil 340-1,000 mg capsule Take 1 capsule by mouth 2 (two) times daily  . omeprazole (PRILOSEC) 20 MG DR capsule Take 1 capsule (20 mg total) by mouth once daily 30 capsule 0  . sacubitriL-valsartan (ENTRESTO) 24-26 mg tablet Take 1 tablet by mouth every 12 (twelve) hours 60 tablet 3  . sildenafiL (VIAGRA) 100 MG tablet TAKE 1 TABLET DAILY 12 tablet 29  . SYMBICORT 160-4.5 mcg/actuation inhaler USE 2 INHALATIONS TWICE A DAY 30.6 g 3  . tamsulosin (FLOMAX) 0.4 mg capsule TAKE 1 CAPSULE TWICE A DAY 30 MINUTES AFTER THE SAME MEALS EACH DAY 180 capsule 3   No current facility-administered medications for this visit.   Allergies: No Known Allergies  Social History:  Social History   Socioeconomic History  . Marital status: Widowed  Spouse name: LaLa -significant other  . Number of children: 2  . Years of education: 68  . Highest education level: High school graduate  Occupational History  . Occupation: Animator- Education officer, community  Tobacco Use  . Smoking status: Current Every Day Smoker  Packs/day: 1.50  Years: 60.00  Pack years: 90.00  Types: Cigarettes  . Smokeless tobacco: Never Used  Substance and Sexual Activity  . Alcohol use: Yes  Alcohol/week: 7.0 standard drinks  Types: 7 Standard drinks or equivalent per week  Comment: occasional  . Drug use: No  . Sexual activity: Yes  Partners: Female  Other Topics Concern  . Not on file  Social History Narrative  . Not on file   Social Determinants of Health   Financial Resource Strain:  . Difficulty of Paying Living Expenses:  Food Insecurity:  . Worried About Charity fundraiser in the Last Year:  . Arboriculturist in the Last Year:  Transportation Needs:  .  Film/video editor (Medical):  Marland Kitchen Lack of Transportation (Non-Medical):  Physical Activity:  . Days of Exercise per Week:  . Minutes of Exercise per Session:  Stress:  . Feeling of Stress :  Social Connections:  . Frequency of Communication with Friends and Family:  . Frequency of Social Gatherings with Friends and Family:  . Attends Religious Services:  . Active Member of Clubs or Organizations:  . Attends Archivist Meetings:  Marland Kitchen Marital Status:   Family History:  Family History  Problem Relation Age of Onset  . Alzheimer's disease Mother  . Other Mother  "Nervous Breakdown"  . No Known Problems Father  . No Known Problems Sister  . Other Maternal Aunt  Cancer  . Parkinsonism Paternal Uncle   Review of Systems:   A 10+ ROS was performed, reviewed, and the pertinent orthopaedic findings are documented in the HPI.   Physical Examination:   BP 140/80  Ht 170.2 cm (5\' 7" )  Wt 77.5 kg (170 lb 12.8 oz)  BMI 26.75 kg/m   Patient is a well-developed, well-nourished  male in no acute distress. Patient has normal mood and affect. Patient is alert and oriented to person, place, and time.   HEENT: Atraumatic, normocephalic. Pupils equal and reactive to light. Extraocular motion intact. Noninjected sclera.  Cardiovascular: Regular rate and rhythm, with no murmurs, rubs, or gallops. Distal pulses palpable.  Respiratory: Lungs clear to auscultation bilaterally.   Left Hip: Pelvic tilt: Negative Limb lengths: Equal with the patient standing Soft tissue swelling: Negative Erythema: Negative Crepitance: Negative Tenderness: Greater trochanter is nontender to palpation. Moderate pain is elicited by axial compression or extremes of rotation. Atrophy: No atrophy. Fair to good hip flexor and abductor strength. Range of Motion: EXT/FLEX: 0/0/110 ADD/ABD: 20/0/20 IR/ER: 0/0/30  Sensation is intact over the saphenous, lateral cutaneous, superficial fibular, and deep  fibular nerve distributions.  Tests Performed/Reviewed:  X-rays  No new radiographs were obtained today. Previous radiographs were reviewed of the left hip and revealed complete loss of femoral acetabular joint space with bone-on-bone contact, subchondral sclerosis of the bone, and deformation of the femoral head noted. Cystic changes of the femoral head noted. No fracture noted.  Impression:   ICD-10-CM  1. Primary osteoarthritis of left hip M16.12   Plan:   The patient has end-stage degenerative changes of the left hip. It was explained to the patient that the condition is progressive in nature. Having failed conservative treatment, the patient has elected to proceed with a total joint arthroplasty. The patient will undergo a total joint arthroplasty with Dr. Marry Guan. The risks of surgery, including blood clot and infection, were discussed with the patient. Measures to reduce these risks, including the use of anticoagulation, perioperative antibiotics, and early ambulation were discussed. The importance of postoperative physical therapy was discussed with the patient. The patient elects to proceed with surgery. The patient is instructed to stop all blood thinners prior to surgery. The patient is instructed to call the hospital the day before surgery to learn of the proper arrival time.   Contact our office with any questions or concerns. Follow up as indicated, or sooner should any new problems arise, if conditions worsen, or if they are otherwise concerned.   Gwenlyn Fudge, PA-C [facility] [place], [place] [address] Phone: [phone]  This note was generated in part with voice recognition software and I apologize for any typographical errors that were not detected and corrected.   Electronically signed by Gwenlyn Fudge, PA at 04/12/2020 10:38 PM EDT

## 2020-04-14 ENCOUNTER — Encounter
Admission: RE | Disposition: A | Discharge status: Home Health | Payer: Self-pay | Source: Home / Self Care | Attending: Orthopedic Surgery

## 2020-04-14 ENCOUNTER — Inpatient Hospital Stay: Payer: Medicare Other | Admitting: Anesthesiology

## 2020-04-14 ENCOUNTER — Inpatient Hospital Stay: Payer: Medicare Other

## 2020-04-14 ENCOUNTER — Inpatient Hospital Stay
Admission: RE | Admit: 2020-04-14 | Discharge: 2020-04-16 | DRG: 470 | Disposition: A | Discharge status: Home Health | Payer: Medicare Other | Attending: Orthopedic Surgery | Admitting: Orthopedic Surgery

## 2020-04-14 ENCOUNTER — Other Ambulatory Visit: Payer: Self-pay

## 2020-04-14 ENCOUNTER — Encounter: Payer: Self-pay | Admitting: Orthopedic Surgery

## 2020-04-14 DIAGNOSIS — M1612 Unilateral primary osteoarthritis, left hip: Principal | ICD-10-CM | POA: Diagnosis present

## 2020-04-14 DIAGNOSIS — Z820 Family history of epilepsy and other diseases of the nervous system: Secondary | ICD-10-CM

## 2020-04-14 DIAGNOSIS — I110 Hypertensive heart disease with heart failure: Secondary | ICD-10-CM | POA: Diagnosis present

## 2020-04-14 DIAGNOSIS — K219 Gastro-esophageal reflux disease without esophagitis: Secondary | ICD-10-CM | POA: Diagnosis present

## 2020-04-14 DIAGNOSIS — N400 Enlarged prostate without lower urinary tract symptoms: Secondary | ICD-10-CM | POA: Diagnosis present

## 2020-04-14 DIAGNOSIS — E785 Hyperlipidemia, unspecified: Secondary | ICD-10-CM | POA: Diagnosis present

## 2020-04-14 DIAGNOSIS — I509 Heart failure, unspecified: Secondary | ICD-10-CM | POA: Diagnosis present

## 2020-04-14 DIAGNOSIS — Z96649 Presence of unspecified artificial hip joint: Secondary | ICD-10-CM

## 2020-04-14 DIAGNOSIS — Z7951 Long term (current) use of inhaled steroids: Secondary | ICD-10-CM

## 2020-04-14 DIAGNOSIS — Z79899 Other long term (current) drug therapy: Secondary | ICD-10-CM | POA: Diagnosis not present

## 2020-04-14 DIAGNOSIS — M4800 Spinal stenosis, site unspecified: Secondary | ICD-10-CM | POA: Diagnosis present

## 2020-04-14 DIAGNOSIS — J431 Panlobular emphysema: Secondary | ICD-10-CM | POA: Diagnosis present

## 2020-04-14 DIAGNOSIS — Z7982 Long term (current) use of aspirin: Secondary | ICD-10-CM

## 2020-04-14 DIAGNOSIS — F17210 Nicotine dependence, cigarettes, uncomplicated: Secondary | ICD-10-CM | POA: Diagnosis present

## 2020-04-14 HISTORY — PX: TOTAL HIP ARTHROPLASTY: SHX124

## 2020-04-14 LAB — ABO/RH: ABO/RH(D): O POS

## 2020-04-14 SURGERY — ARTHROPLASTY, HIP, TOTAL,POSTERIOR APPROACH
Anesthesia: Spinal | Site: Hip | Laterality: Left

## 2020-04-14 MED ORDER — FLEET ENEMA 7-19 GM/118ML RE ENEM: 1.0000 | ENEMA | Freq: Once | RECTAL | Status: DC | PRN

## 2020-04-14 MED ORDER — MONTELUKAST SODIUM 10 MG PO TABS
10.0000 mg | ORAL_TABLET | Freq: Every day | ORAL | Status: DC
  Administered 2020-04-14 – 2020-04-15 (×2): 10 mg via ORAL
  Filled 2020-04-14 (×2): qty 1

## 2020-04-14 MED ORDER — METOCLOPRAMIDE HCL 10 MG PO TABS: 5.0000 mg | ORAL_TABLET | Freq: Three times a day (TID) | ORAL | Status: DC | PRN

## 2020-04-14 MED ORDER — ONDANSETRON HCL 4 MG PO TABS: 4.0000 mg | ORAL_TABLET | Freq: Four times a day (QID) | ORAL | Status: DC | PRN

## 2020-04-14 MED ORDER — GABAPENTIN 300 MG PO CAPS: 300.0000 mg | ORAL_CAPSULE | Freq: Once | ORAL | Status: AC

## 2020-04-14 MED ORDER — FENTANYL CITRATE (PF) 100 MCG/2ML IJ SOLN
INTRAMUSCULAR | Status: AC
  Filled 2020-04-14: qty 2

## 2020-04-14 MED ORDER — ACETAMINOPHEN 325 MG PO TABS: 325.0000 mg | ORAL_TABLET | Freq: Four times a day (QID) | ORAL | Status: DC | PRN

## 2020-04-14 MED ORDER — PROPOFOL 10 MG/ML IV BOLUS
INTRAVENOUS | Status: DC | PRN
  Administered 2020-04-14: 30 mg via INTRAVENOUS
  Administered 2020-04-14: 20 mg via INTRAVENOUS

## 2020-04-14 MED ORDER — KETAMINE HCL 50 MG/ML IJ SOLN
INTRAMUSCULAR | Status: DC | PRN
  Administered 2020-04-14: 50 mg via INTRAVENOUS

## 2020-04-14 MED ORDER — ENSURE ENLIVE PO LIQD: 296.0000 mL | Freq: Once | ORAL | Status: DC

## 2020-04-14 MED ORDER — FENTANYL CITRATE (PF) 100 MCG/2ML IJ SOLN
INTRAMUSCULAR | Status: AC
  Administered 2020-04-14: 25 ug via INTRAVENOUS
  Filled 2020-04-14: qty 2

## 2020-04-14 MED ORDER — DIPHENHYDRAMINE HCL 12.5 MG/5ML PO ELIX: 12.5000 mg | ORAL_SOLUTION | ORAL | Status: DC | PRN

## 2020-04-14 MED ORDER — CELECOXIB 200 MG PO CAPS
ORAL_CAPSULE | ORAL | Status: AC
  Administered 2020-04-14: 400 mg via ORAL
  Filled 2020-04-14: qty 2

## 2020-04-14 MED ORDER — PROPOFOL 500 MG/50ML IV EMUL
INTRAVENOUS | Status: AC
  Filled 2020-04-14: qty 50

## 2020-04-14 MED ORDER — ACETAMINOPHEN 10 MG/ML IV SOLN
INTRAVENOUS | Status: DC | PRN
  Administered 2020-04-14: 1000 mg via INTRAVENOUS

## 2020-04-14 MED ORDER — FERROUS SULFATE 325 (65 FE) MG PO TABS
325.0000 mg | ORAL_TABLET | Freq: Two times a day (BID) | ORAL | Status: DC
  Administered 2020-04-14 – 2020-04-16 (×4): 325 mg via ORAL
  Filled 2020-04-14 (×4): qty 1

## 2020-04-14 MED ORDER — CHLORHEXIDINE GLUCONATE 0.12 % MT SOLN: 15.0000 mL | Freq: Once | OROMUCOSAL | Status: AC

## 2020-04-14 MED ORDER — FENTANYL CITRATE (PF) 100 MCG/2ML IJ SOLN
INTRAMUSCULAR | Status: DC | PRN
  Administered 2020-04-14 (×2): 50 ug via INTRAVENOUS

## 2020-04-14 MED ORDER — DEXAMETHASONE SODIUM PHOSPHATE 10 MG/ML IJ SOLN: 8.0000 mg | Freq: Once | INTRAMUSCULAR | Status: AC

## 2020-04-14 MED ORDER — CHLORHEXIDINE GLUCONATE 0.12 % MT SOLN
OROMUCOSAL | Status: AC
  Administered 2020-04-14: 15 mL via OROMUCOSAL
  Filled 2020-04-14: qty 15

## 2020-04-14 MED ORDER — OXYCODONE HCL 5 MG PO TABS
5.0000 mg | ORAL_TABLET | ORAL | Status: DC | PRN
  Administered 2020-04-15: 5 mg via ORAL
  Filled 2020-04-14: qty 1

## 2020-04-14 MED ORDER — BUPIVACAINE LIPOSOME 1.3 % IJ SUSP
INTRAMUSCULAR | Status: AC
  Filled 2020-04-14: qty 20

## 2020-04-14 MED ORDER — FLUOXETINE HCL 20 MG PO CAPS
20.0000 mg | ORAL_CAPSULE | Freq: Every day | ORAL | Status: DC
  Administered 2020-04-14 – 2020-04-16 (×3): 20 mg via ORAL
  Filled 2020-04-14 (×4): qty 1

## 2020-04-14 MED ORDER — FINASTERIDE 5 MG PO TABS
5.0000 mg | ORAL_TABLET | Freq: Every day | ORAL | Status: DC
  Administered 2020-04-15 – 2020-04-16 (×2): 5 mg via ORAL
  Filled 2020-04-14 (×2): qty 1

## 2020-04-14 MED ORDER — OXYCODONE HCL 5 MG/5ML PO SOLN: 5.0000 mg | Freq: Once | ORAL | Status: DC | PRN

## 2020-04-14 MED ORDER — OMEGA-3-ACID ETHYL ESTERS 1 G PO CAPS
1.0000 g | ORAL_CAPSULE | Freq: Every day | ORAL | Status: DC
  Administered 2020-04-14 – 2020-04-16 (×3): 1 g via ORAL
  Filled 2020-04-14 (×3): qty 1

## 2020-04-14 MED ORDER — PROPOFOL 500 MG/50ML IV EMUL
INTRAVENOUS | Status: DC | PRN
  Administered 2020-04-14: 25 ug/kg/min via INTRAVENOUS

## 2020-04-14 MED ORDER — PANTOPRAZOLE SODIUM 40 MG PO TBEC
40.0000 mg | DELAYED_RELEASE_TABLET | Freq: Two times a day (BID) | ORAL | Status: DC
  Administered 2020-04-14 – 2020-04-16 (×5): 40 mg via ORAL
  Filled 2020-04-14 (×5): qty 1

## 2020-04-14 MED ORDER — GLYCOPYRROLATE 0.2 MG/ML IJ SOLN
INTRAMUSCULAR | Status: DC | PRN
  Administered 2020-04-14: .2 mg via INTRAVENOUS

## 2020-04-14 MED ORDER — TRAMADOL HCL 50 MG PO TABS
50.0000 mg | ORAL_TABLET | ORAL | Status: DC | PRN
  Administered 2020-04-14: 100 mg via ORAL
  Filled 2020-04-14: qty 2

## 2020-04-14 MED ORDER — BISACODYL 10 MG RE SUPP: 10.0000 mg | Freq: Every day | RECTAL | Status: DC | PRN

## 2020-04-14 MED ORDER — OXYCODONE HCL 5 MG PO TABS: 5.0000 mg | ORAL_TABLET | Freq: Once | ORAL | Status: DC | PRN

## 2020-04-14 MED ORDER — MOMETASONE FURO-FORMOTEROL FUM 200-5 MCG/ACT IN AERO
2.0000 | INHALATION_SPRAY | Freq: Two times a day (BID) | RESPIRATORY_TRACT | Status: DC
  Administered 2020-04-14 – 2020-04-16 (×4): 2 via RESPIRATORY_TRACT
  Filled 2020-04-14: qty 8.8

## 2020-04-14 MED ORDER — TRANEXAMIC ACID-NACL 1000-0.7 MG/100ML-% IV SOLN
1000.0000 mg | Freq: Once | INTRAVENOUS | Status: AC
  Administered 2020-04-14: 1000 mg via INTRAVENOUS

## 2020-04-14 MED ORDER — METOCLOPRAMIDE HCL 5 MG/ML IJ SOLN: 5.0000 mg | Freq: Three times a day (TID) | INTRAMUSCULAR | Status: DC | PRN

## 2020-04-14 MED ORDER — CALCIUM CARBONATE-VITAMIN D 500-200 MG-UNIT PO TABS
1.0000 | ORAL_TABLET | Freq: Every day | ORAL | Status: DC
  Administered 2020-04-14 – 2020-04-16 (×3): 1 via ORAL
  Filled 2020-04-14 (×3): qty 1

## 2020-04-14 MED ORDER — CELECOXIB 200 MG PO CAPS: 400.0000 mg | ORAL_CAPSULE | Freq: Once | ORAL | Status: AC

## 2020-04-14 MED ORDER — ACETAMINOPHEN 10 MG/ML IV SOLN
INTRAVENOUS | Status: AC
  Filled 2020-04-14: qty 100

## 2020-04-14 MED ORDER — TRANEXAMIC ACID-NACL 1000-0.7 MG/100ML-% IV SOLN
INTRAVENOUS | Status: AC
  Filled 2020-04-14: qty 100

## 2020-04-14 MED ORDER — LEVALBUTEROL TARTRATE 45 MCG/ACT IN AERO: 1.0000 | INHALATION_SPRAY | Freq: Four times a day (QID) | RESPIRATORY_TRACT | Status: DC | PRN

## 2020-04-14 MED ORDER — NEOMYCIN-POLYMYXIN B GU 40-200000 IR SOLN
Status: AC
  Filled 2020-04-14: qty 20

## 2020-04-14 MED ORDER — GABAPENTIN 300 MG PO CAPS
300.0000 mg | ORAL_CAPSULE | Freq: Every day | ORAL | Status: DC
  Administered 2020-04-14 – 2020-04-15 (×2): 300 mg via ORAL
  Filled 2020-04-14 (×2): qty 1

## 2020-04-14 MED ORDER — DEXAMETHASONE SODIUM PHOSPHATE 10 MG/ML IJ SOLN
INTRAMUSCULAR | Status: AC
  Administered 2020-04-14: 8 mg via INTRAVENOUS
  Filled 2020-04-14: qty 1

## 2020-04-14 MED ORDER — ONDANSETRON HCL 4 MG/2ML IJ SOLN: 4.0000 mg | Freq: Four times a day (QID) | INTRAMUSCULAR | Status: DC | PRN

## 2020-04-14 MED ORDER — PHENOL 1.4 % MT LIQD
1.0000 | OROMUCOSAL | Status: DC | PRN
  Filled 2020-04-14: qty 177

## 2020-04-14 MED ORDER — OXYCODONE HCL 5 MG PO TABS
10.0000 mg | ORAL_TABLET | ORAL | Status: DC | PRN
  Administered 2020-04-15: 10 mg via ORAL
  Filled 2020-04-14: qty 2

## 2020-04-14 MED ORDER — BUPIVACAINE HCL (PF) 0.25 % IJ SOLN
INTRAMUSCULAR | Status: AC
  Filled 2020-04-14: qty 60

## 2020-04-14 MED ORDER — MAGNESIUM HYDROXIDE 400 MG/5ML PO SUSP
30.0000 mL | Freq: Every day | ORAL | Status: DC
  Administered 2020-04-14 – 2020-04-15 (×2): 30 mL via ORAL
  Filled 2020-04-14 (×3): qty 30

## 2020-04-14 MED ORDER — ASCORBIC ACID 500 MG PO TABS
500.0000 mg | ORAL_TABLET | Freq: Every day | ORAL | Status: DC
  Administered 2020-04-14 – 2020-04-16 (×3): 500 mg via ORAL
  Filled 2020-04-14 (×3): qty 1

## 2020-04-14 MED ORDER — ORAL CARE MOUTH RINSE: 15.0000 mL | Freq: Once | OROMUCOSAL | Status: AC

## 2020-04-14 MED ORDER — DIGOXIN 125 MCG PO TABS
0.1250 mg | ORAL_TABLET | Freq: Every day | ORAL | Status: DC
  Administered 2020-04-15 – 2020-04-16 (×2): 0.125 mg via ORAL
  Filled 2020-04-14 (×2): qty 1

## 2020-04-14 MED ORDER — NEOMYCIN-POLYMYXIN B GU 40-200000 IR SOLN
Status: DC | PRN
  Administered 2020-04-14: 16 mL

## 2020-04-14 MED ORDER — TAMSULOSIN HCL 0.4 MG PO CAPS
0.4000 mg | ORAL_CAPSULE | Freq: Every day | ORAL | Status: DC
  Administered 2020-04-15 – 2020-04-16 (×2): 0.4 mg via ORAL
  Filled 2020-04-14 (×2): qty 1

## 2020-04-14 MED ORDER — LIDOCAINE HCL (PF) 2 % IJ SOLN
INTRAMUSCULAR | Status: DC | PRN
  Administered 2020-04-14: 50 mg

## 2020-04-14 MED ORDER — KETAMINE HCL 50 MG/ML IJ SOLN
INTRAMUSCULAR | Status: AC
  Filled 2020-04-14: qty 10

## 2020-04-14 MED ORDER — CHLORHEXIDINE GLUCONATE 4 % EX LIQD: 60.0000 mL | Freq: Once | CUTANEOUS | Status: DC

## 2020-04-14 MED ORDER — CARVEDILOL 3.125 MG PO TABS
3.1250 mg | ORAL_TABLET | Freq: Two times a day (BID) | ORAL | Status: DC
  Administered 2020-04-14 – 2020-04-16 (×4): 3.125 mg via ORAL
  Filled 2020-04-14 (×4): qty 1

## 2020-04-14 MED ORDER — SACUBITRIL-VALSARTAN 24-26 MG PO TABS
1.0000 | ORAL_TABLET | Freq: Two times a day (BID) | ORAL | Status: DC
  Administered 2020-04-14 – 2020-04-16 (×5): 1 via ORAL
  Filled 2020-04-14 (×6): qty 1

## 2020-04-14 MED ORDER — TRANEXAMIC ACID-NACL 1000-0.7 MG/100ML-% IV SOLN: 1000.0000 mg | INTRAVENOUS | Status: DC

## 2020-04-14 MED ORDER — CEFAZOLIN SODIUM-DEXTROSE 2-4 GM/100ML-% IV SOLN
2.0000 g | Freq: Four times a day (QID) | INTRAVENOUS | Status: AC
  Administered 2020-04-14 – 2020-04-15 (×3): 2 g via INTRAVENOUS
  Filled 2020-04-14 (×4): qty 100

## 2020-04-14 MED ORDER — SODIUM CHLORIDE 0.9 % IV SOLN: INTRAVENOUS | Status: DC

## 2020-04-14 MED ORDER — ALBUTEROL SULFATE (2.5 MG/3ML) 0.083% IN NEBU: 2.5000 mg | INHALATION_SOLUTION | Freq: Four times a day (QID) | RESPIRATORY_TRACT | Status: DC | PRN

## 2020-04-14 MED ORDER — METOCLOPRAMIDE HCL 10 MG PO TABS
10.0000 mg | ORAL_TABLET | Freq: Three times a day (TID) | ORAL | Status: DC
  Administered 2020-04-14 – 2020-04-16 (×7): 10 mg via ORAL
  Filled 2020-04-14 (×7): qty 1

## 2020-04-14 MED ORDER — CEFAZOLIN SODIUM-DEXTROSE 2-4 GM/100ML-% IV SOLN
INTRAVENOUS | Status: AC
  Administered 2020-04-14: 2000 mg via INTRAVENOUS
  Filled 2020-04-14: qty 100

## 2020-04-14 MED ORDER — SENNOSIDES-DOCUSATE SODIUM 8.6-50 MG PO TABS
1.0000 | ORAL_TABLET | Freq: Two times a day (BID) | ORAL | Status: DC
  Administered 2020-04-14 – 2020-04-16 (×5): 1 via ORAL
  Filled 2020-04-14 (×5): qty 1

## 2020-04-14 MED ORDER — ALUM & MAG HYDROXIDE-SIMETH 200-200-20 MG/5ML PO SUSP: 30.0000 mL | ORAL | Status: DC | PRN

## 2020-04-14 MED ORDER — CELECOXIB 200 MG PO CAPS
200.0000 mg | ORAL_CAPSULE | Freq: Two times a day (BID) | ORAL | Status: DC
  Administered 2020-04-14 – 2020-04-16 (×5): 200 mg via ORAL
  Filled 2020-04-14 (×5): qty 1

## 2020-04-14 MED ORDER — BUPIVACAINE HCL (PF) 0.5 % IJ SOLN
INTRAMUSCULAR | Status: DC | PRN
  Administered 2020-04-14: 3 mL via INTRATHECAL

## 2020-04-14 MED ORDER — GABAPENTIN 300 MG PO CAPS
ORAL_CAPSULE | ORAL | Status: AC
  Administered 2020-04-14: 300 mg via ORAL
  Filled 2020-04-14: qty 1

## 2020-04-14 MED ORDER — LIDOCAINE HCL (PF) 2 % IJ SOLN
INTRAMUSCULAR | Status: AC
  Filled 2020-04-14: qty 5

## 2020-04-14 MED ORDER — MENTHOL 3 MG MT LOZG
1.0000 | LOZENGE | OROMUCOSAL | Status: DC | PRN
  Filled 2020-04-14: qty 9

## 2020-04-14 MED ORDER — TRANEXAMIC ACID-NACL 1000-0.7 MG/100ML-% IV SOLN
INTRAVENOUS | Status: DC | PRN
  Administered 2020-04-14: 1000 mg via INTRAVENOUS

## 2020-04-14 MED ORDER — FENTANYL CITRATE (PF) 100 MCG/2ML IJ SOLN
25.0000 ug | INTRAMUSCULAR | Status: DC | PRN
  Administered 2020-04-14 (×2): 25 ug via INTRAVENOUS

## 2020-04-14 MED ORDER — CEFAZOLIN SODIUM-DEXTROSE 2-3 GM-%(50ML) IV SOLR
INTRAVENOUS | Status: DC | PRN
Start: 2020-04-14 — End: 2020-04-14
  Administered 2020-04-14: 2 g via INTRAVENOUS

## 2020-04-14 MED ORDER — HYDROMORPHONE HCL 1 MG/ML IJ SOLN
0.5000 mg | INTRAMUSCULAR | Status: DC | PRN
  Administered 2020-04-14: 1 mg via INTRAVENOUS
  Filled 2020-04-14: qty 1

## 2020-04-14 MED ORDER — ADULT MULTIVITAMIN W/MINERALS CH
1.0000 | ORAL_TABLET | Freq: Every day | ORAL | Status: DC
  Administered 2020-04-14 – 2020-04-16 (×3): 1 via ORAL
  Filled 2020-04-14 (×3): qty 1

## 2020-04-14 MED ORDER — PHENYLEPHRINE HCL (PRESSORS) 10 MG/ML IV SOLN
INTRAVENOUS | Status: AC
  Filled 2020-04-14: qty 1

## 2020-04-14 MED ORDER — LACTATED RINGERS IV SOLN: INTRAVENOUS | Status: DC

## 2020-04-14 MED ORDER — BUPIVACAINE HCL (PF) 0.5 % IJ SOLN
INTRAMUSCULAR | Status: AC
  Filled 2020-04-14: qty 10

## 2020-04-14 MED ORDER — ACETAMINOPHEN 10 MG/ML IV SOLN
1000.0000 mg | Freq: Four times a day (QID) | INTRAVENOUS | Status: AC
  Administered 2020-04-14 – 2020-04-15 (×4): 1000 mg via INTRAVENOUS
  Filled 2020-04-14 (×4): qty 100

## 2020-04-14 MED ORDER — ENOXAPARIN SODIUM 30 MG/0.3ML ~~LOC~~ SOLN
30.0000 mg | Freq: Two times a day (BID) | SUBCUTANEOUS | Status: DC
  Administered 2020-04-15 – 2020-04-16 (×3): 30 mg via SUBCUTANEOUS
  Filled 2020-04-14 (×3): qty 0.3

## 2020-04-14 MED ORDER — CEFAZOLIN SODIUM-DEXTROSE 2-4 GM/100ML-% IV SOLN: 2.0000 g | INTRAVENOUS | Status: DC

## 2020-04-14 SURGICAL SUPPLY — 61 items
ARTICULEZE HEAD (Hips) ×3 IMPLANT
BLADE DRUM FLTD (BLADE) ×3 IMPLANT
BLADE SAW 90X25X1.19 OSCILLAT (BLADE) ×3 IMPLANT
CANISTER SUCT 1200ML W/VALVE (MISCELLANEOUS) ×3 IMPLANT
CANISTER SUCT 3000ML PPV (MISCELLANEOUS) ×6 IMPLANT
CARTRIDGE OIL MAESTRO DRILL (MISCELLANEOUS) ×1 IMPLANT
COVER WAND RF STERILE (DRAPES) ×3 IMPLANT
CUP ACETBLR 52 OD 100 SERIES (Hips) ×2 IMPLANT
DIFFUSER DRILL AIR PNEUMATIC (MISCELLANEOUS) ×3 IMPLANT
DRAPE 3/4 80X56 (DRAPES) ×3 IMPLANT
DRAPE INCISE IOBAN 66X60 STRL (DRAPES) ×3 IMPLANT
DRSG DERMACEA 8X12 NADH (GAUZE/BANDAGES/DRESSINGS) ×3 IMPLANT
DRSG OPSITE POSTOP 4X12 (GAUZE/BANDAGES/DRESSINGS) ×3 IMPLANT
DRSG OPSITE POSTOP 4X14 (GAUZE/BANDAGES/DRESSINGS) ×2 IMPLANT
DRSG TEGADERM 4X4.75 (GAUZE/BANDAGES/DRESSINGS) ×3 IMPLANT
DURAPREP 26ML APPLICATOR (WOUND CARE) ×3 IMPLANT
ELECT REM PT RETURN 9FT ADLT (ELECTROSURGICAL) ×3
ELECTRODE REM PT RTRN 9FT ADLT (ELECTROSURGICAL) ×1 IMPLANT
GLOVE BIO SURGEON STRL SZ7.5 (GLOVE) ×6 IMPLANT
GLOVE BIOGEL M STRL SZ7.5 (GLOVE) ×6 IMPLANT
GLOVE BIOGEL PI IND STRL 7.5 (GLOVE) ×1 IMPLANT
GLOVE BIOGEL PI INDICATOR 7.5 (GLOVE) ×2
GLOVE INDICATOR 8.0 STRL GRN (GLOVE) ×3 IMPLANT
GOWN STRL REUS W/ TWL LRG LVL3 (GOWN DISPOSABLE) ×2 IMPLANT
GOWN STRL REUS W/ TWL XL LVL3 (GOWN DISPOSABLE) ×1 IMPLANT
GOWN STRL REUS W/TWL LRG LVL3 (GOWN DISPOSABLE) ×6
GOWN STRL REUS W/TWL XL LVL3 (GOWN DISPOSABLE) ×3
HEAD ARTICULEZE (Hips) IMPLANT
HEMOVAC 400CC 10FR (MISCELLANEOUS) ×3 IMPLANT
HOLDER FOLEY CATH W/STRAP (MISCELLANEOUS) ×3 IMPLANT
HOOD PEEL AWAY FLYTE STAYCOOL (MISCELLANEOUS) ×6 IMPLANT
KIT TURNOVER KIT A (KITS) ×3 IMPLANT
LINER MARATHON 4MM 10DEG 36X52 (Hips) ×2 IMPLANT
MANIFOLD NEPTUNE II (INSTRUMENTS) ×3 IMPLANT
NDL SAFETY ECLIPSE 18X1.5 (NEEDLE) ×1 IMPLANT
NEEDLE HYPO 18GX1.5 SHARP (NEEDLE) ×3
NS IRRIG 500ML POUR BTL (IV SOLUTION) ×3 IMPLANT
OIL CARTRIDGE MAESTRO DRILL (MISCELLANEOUS) ×3
PACK HIP PROSTHESIS (MISCELLANEOUS) ×3 IMPLANT
PENCIL SMOKE ULTRAEVAC 22 CON (MISCELLANEOUS) ×3 IMPLANT
PIN STEIN THRED 5/32 (Pin) ×3 IMPLANT
PULSAVAC PLUS IRRIG FAN TIP (DISPOSABLE) ×3
SOL .9 NS 3000ML IRR  AL (IV SOLUTION) ×3
SOL .9 NS 3000ML IRR AL (IV SOLUTION) ×1
SOL .9 NS 3000ML IRR UROMATIC (IV SOLUTION) ×1 IMPLANT
SOL PREP PVP 2OZ (MISCELLANEOUS) ×3
SOLUTION PREP PVP 2OZ (MISCELLANEOUS) ×1 IMPLANT
SPONGE DRAIN TRACH 4X4 STRL 2S (GAUZE/BANDAGES/DRESSINGS) ×3 IMPLANT
STAPLER SKIN PROX 35W (STAPLE) ×3 IMPLANT
STEM AML 12.0 STD 6 LRG (Hips) ×2 IMPLANT
SUT ETHIBOND #5 BRAIDED 30INL (SUTURE) ×3 IMPLANT
SUT VIC AB 0 CT1 36 (SUTURE) ×3 IMPLANT
SUT VIC AB 1 CT1 36 (SUTURE) ×6 IMPLANT
SUT VIC AB 2-0 CT1 27 (SUTURE) ×3
SUT VIC AB 2-0 CT1 TAPERPNT 27 (SUTURE) ×1 IMPLANT
SYR 20ML LL LF (SYRINGE) ×3 IMPLANT
TAPE CLOTH 3X10 WHT NS LF (GAUZE/BANDAGES/DRESSINGS) ×3 IMPLANT
TAPE TRANSPORE STRL 2 31045 (GAUZE/BANDAGES/DRESSINGS) ×3 IMPLANT
TIP FAN IRRIG PULSAVAC PLUS (DISPOSABLE) ×1 IMPLANT
TOWEL OR 17X26 4PK STRL BLUE (TOWEL DISPOSABLE) ×3 IMPLANT
TRAY FOLEY MTR SLVR 16FR STAT (SET/KITS/TRAYS/PACK) ×3 IMPLANT

## 2020-04-14 NOTE — Transfer of Care (Signed)
Immediate Anesthesia Transfer of Care Note  Patient: Mathew Reyes  Procedure(s) Performed: TOTAL HIP ARTHROPLASTY (Left Hip)  Patient Location: PACU  Anesthesia Type:Spinal  Level of Consciousness: sedated  Airway & Oxygen Therapy: Patient Spontanous Breathing and Patient connected to face mask oxygen  Post-op Assessment: Report given to RN and Post -op Vital signs reviewed and stable  Post vital signs: Reviewed  Last Vitals:  Vitals Value Taken Time  BP 131/81 04/14/20 1044  Temp    Pulse 65 04/14/20 1045  Resp 14 04/14/20 1043  SpO2 98 % 04/14/20 1045  Vitals shown include unvalidated device data.  Last Pain:  Vitals:   04/14/20 0636  TempSrc: Tympanic  PainSc: 0-No pain         Complications: No apparent anesthesia complications

## 2020-04-14 NOTE — H&P (Signed)
The patient has been re-examined, and the chart reviewed, and there have been no interval changes to the documented history and physical.    The risks, benefits, and alternatives have been discussed at length. The patient expressed understanding of the risks benefits and agreed with plans for surgical intervention.  [name], [name] M.D.    

## 2020-04-14 NOTE — Op Note (Signed)
OPERATIVE NOTE  DATE OF SURGERY:  04/14/2020  PATIENT NAME:  Mathew Reyes   DOB: [birth year]  MRN: [id number]  PRE-OPERATIVE DIAGNOSIS: Degenerative arthrosis of the left hip, primary  POST-OPERATIVE DIAGNOSIS:  Same  PROCEDURE:  Left total hip arthroplasty  SURGEON:  Marciano Sequin. M.D.  ASSISTANT:  Cassell Smiles, PA-C (present and scrubbed throughout the case, critical for assistance with exposure, retraction, instrumentation, and closure)  ANESTHESIA: spinal  ESTIMATED BLOOD LOSS: 50 mL  FLUIDS REPLACED: 1300 mL of crystalloid  DRAINS: 2 medium drains to a Hemovac reservoir  IMPLANTS UTILIZED: DePuy 12 mm large stature AML femoral stem, 52 mm OD Pinnacle 100 acetabular component, +4 mm 10 degree Pinnacle Marathon polyethylene insert, and a 36 mm M-SPEC +5 mm hip ball  INDICATIONS FOR SURGERY: Mathew Reyes is a 79 y.o. year old male with a long history of progressive hip and groin  pain. X-rays demonstrated severe degenerative changes. The patient had not seen any significant improvement despite conservative nonsurgical intervention. After discussion of the risks and benefits of surgical intervention, the patient expressed understanding of the risks benefits and agree with plans for total hip arthroplasty.   The risks, benefits, and alternatives were discussed at length including but not limited to the risks of infection, bleeding, nerve injury, stiffness, blood clots, the need for revision surgery, limb length inequality, dislocation, cardiopulmonary complications, among others, and they were willing to proceed.  PROCEDURE IN DETAIL: The patient was brought into the operating room and, after adequate spinal anesthesia was achieved, the patient was placed in a right lateral decubitus position. Axillary roll was placed and all bony prominences were well-padded. The patient's left hip was cleaned and prepped with alcohol and DuraPrep and draped in the usual sterile fashion. A  "timeout" was performed as per usual protocol. A lateral curvilinear incision was made gently curving towards the posterior superior iliac spine. The IT band was incised in line with the skin incision and the fibers of the gluteus maximus were split in line. The piriformis tendon was identified, skeletonized, and incised at its insertion to the proximal femur and reflected posteriorly. A T type posterior capsulotomy was performed. Prior to dislocation of the femoral head, a threaded Steinmann pin was inserted through a separate stab incision into the pelvis superior to the acetabulum and bent in the form of a stylus so as to assess limb length and hip offset throughout the procedure. The femoral head was then dislocated posteriorly. Inspection of the femoral head demonstrated severe degenerative changes with full-thickness loss of articular cartilage. The femoral neck cut was performed using an oscillating saw. The anterior capsule was elevated off of the femoral neck using a periosteal elevator. Attention was then directed to the acetabulum. The remnant of the labrum was excised using electrocautery. Inspection of the acetabulum also demonstrated significant degenerative changes. The acetabulum was reamed in sequential fashion up to a 51 mm diameter. Good punctate bleeding bone was encountered. A 52 mm Pinnacle 100 acetabular component was positioned and impacted into place. Good scratch fit was appreciated. A+4 mm  neutral polyethylene trial was inserted.  Attention was then directed to the proximal femur. A hole for reaming of the proximal femoral canal was created using a high-speed burr. The femoral canal was reamed in sequential fashion up to a 11.5 mm diameter. This allowed for approximately 7 cm of scratch fit. It was thus elected to ream up to a 12 mm diamerter to allow for a line to  line fit. Serial broaches were inserted up to a 12 mm large stature femoral broach. Calcar region was planed and a trial  reduction was performed using a 36 mm hip ball with a +5 mm neck length. Reasonably good stability was appreciated, but a +4 mm  10 degree trial was exchanged for the +4 mm neutral liner. Good equalization of limb lengths and hip offset was appreciated and excellent stability was noted both anteriorly and posteriorly. Trial components were removed. The acetabular shell was irrigated with copious amounts of normal saline with antibiotic solution and suctioned dry. A +4 mm 10 degree Pinnacle Marathon polyethylene insert was positioned with the high side at the 4 o'clock position and impacted into place. Next, a 12 mm large stature AML femoral stem was positioned and impacted into place. Excellent scratch fit was appreciated. A trial reduction was again performed with a 36 mm hip ball with a +5 mm mm neck length. Again, good equalization of limb lengths was appreciated and excellent stability appreciated both anteriorly and posteriorly. The hip was then dislocated and the trial hip ball was removed. The Morse taper was cleaned and dried. A 36 mm M-SPEC hip ball with a +5 mm neck length was placed on the trunnion and impacted into place. The hip was then reduced and placed through range of motion. Excellent stability was appreciated both anteriorly and posteriorly.  The wound was irrigated with copious amounts of normal saline with antibiotic solution and suctioned dry. Good hemostasis was appreciated. The posterior capsulotomy was repaired using #5 Ethibond. Piriformis tendon was reapproximated to the undersurface of the gluteus medius tendon using #5 Ethibond. Two medium drains were placed in the wound bed and brought out through separate stab incisions to be attached to a Hemovac reservoir. The IT band was reapproximated using interrupted sutures of #1 Vicryl. Subcutaneous tissue was approximated using first #0 Vicryl followed by #2-0 Vicryl. The skin was closed with skin staples.  The patient tolerated the  procedure well and was transported to the recovery room in stable condition.   Marciano Sequin., M.D.

## 2020-04-14 NOTE — Anesthesia Procedure Notes (Signed)
Spinal ° °Patient location during procedure: OR °Staffing °Performed: resident/CRNA  °Anesthesiologist: Piscitello, Joseph K, MD °Resident/CRNA: Karielle Davidow, CRNA °Preanesthetic Checklist °Completed: patient identified, IV checked, site marked, risks and benefits discussed, surgical consent, monitors and equipment checked, pre-op evaluation and timeout performed °Spinal Block °Patient position: sitting °Prep: ChloraPrep and site prepped and draped °Patient monitoring: heart rate, continuous pulse ox, blood pressure and cardiac monitor °Approach: midline °Location: L4-5 °Injection technique: single-shot °Needle °Needle type: Introducer and Pencan  °Needle gauge: 24 G °Needle length: 9 cm °Additional Notes °Negative paresthesia. Negative blood return. Positive free-flowing CSF. Expiration date of kit checked and confirmed. Patient tolerated procedure well, without complications. ° ° ° ° ° ° °

## 2020-04-14 NOTE — Evaluation (Signed)
Physical Therapy Evaluation Patient Details Name: Mathew Reyes MRN: [id number] DOB: [birth year] Today's Date: 04/14/2020   History of Present Illness  Pt admitted for L THR and is POD 0 at time of evaluation. PMH includes COPD, HTN, and CHF.  Clinical Impression  Pt is a pleasant 79 year old male who was admitted for L THR. Pt performs bed mobility, transfers, and ambulation with cga and RW. Pt demonstrates deficits with strength/mobility/pain. Would benefit from skilled PT to address above deficits and promote optimal return to PLOF. Recommend transition to [place] upon discharge from acute hospitalization.     Follow Up Recommendations Home health PT    Equipment Recommendations  None recommended by PT    Recommendations for Other Services       Precautions / Restrictions Precautions Precautions: Posterior Hip;Fall Precaution Booklet Issued: No Restrictions Weight Bearing Restrictions: Yes LLE Weight Bearing: Weight bearing as tolerated      Mobility  Bed Mobility Overal bed mobility: Needs Assistance Bed Mobility: Supine to Sit     Supine to sit: Min guard     General bed mobility comments: safe technique and follows commands well. CUes for hip precautions  Transfers Overall transfer level: Needs assistance Equipment used: Rolling walker (2 wheeled) Transfers: Sit to/from Stand Sit to Stand: Min guard         General transfer comment: safe technique with cues for hand placement  Ambulation/Gait Ambulation/Gait assistance: Min guard Gait Distance (Feet): 5 Feet Assistive device: Rolling walker (2 wheeled) Gait Pattern/deviations: Step-to pattern     General Gait Details: ambulated to recliner with safe technique and cga. Safe technique. Hesitant for Mathew Reyes. Step to gait pattern  Stairs            Wheelchair Mobility    Modified Rankin (Stroke Patients Only)       Balance Overall balance assessment: Needs assistance Sitting-balance support:  Feet supported;Bilateral upper extremity supported Sitting balance-[name] Scale: Good     Standing balance support: Bilateral upper extremity supported Standing balance-[name] Scale: Good                               Pertinent Vitals/Pain Pain Assessment: 0-10 Pain Score: 8  Pain Location: L hip Pain Descriptors / Indicators: Operative site guarding;Discomfort Pain Intervention(s): Limited activity within patient's tolerance;Ice applied;Patient requesting pain meds-RN notified    Home Living Family/patient expects to be discharged to:: Private residence Living Arrangements: Spouse/significant other(girlfriend) Available Help at Discharge: Family;Available 24 hours/day Type of Home: House Home Access: Stairs to enter Entrance Stairs-Rails: Can reach both Entrance Stairs-Number of Steps: 5 Home Layout: Two level;Able to live on main level with bedroom/bathroom Home Equipment: Gilford Rile - 2 wheels;Cane - single point;Shower seat      Prior Function Level of Independence: Independent         Comments: is working part time as a Education officer, community and was using RW as needed for long distances.     Hand Dominance        Extremity/Trunk Assessment   Upper Extremity Assessment Upper Extremity Assessment: Overall WFL for tasks assessed    Lower Extremity Assessment Lower Extremity Assessment: Generalized weakness(L LE grossly 3/5; R LE grossly 4/5)       Communication   Communication: No difficulties  Cognition Arousal/Alertness: Awake/alert Behavior During Therapy: WFL for tasks assessed/performed Overall Cognitive Status: Within Functional Limits for tasks assessed  General Comments      Exercises Other Exercises Other Exercises: supine ther-ex on L LE including AP, quad sets, hip abd/add, and glut sets. All ther-ex performed x 10 reps with cga.   Assessment/Plan    PT Assessment Patient needs  continued PT services  PT Problem List Decreased strength;Decreased activity tolerance;Decreased balance;Decreased mobility;Pain       PT Treatment Interventions DME instruction;Gait training;Therapeutic exercise;Balance training    PT Goals (Current goals can be found in the Care Plan section)  Acute Rehab PT Goals Patient Stated Goal: to go home PT Goal Formulation: With patient Time For Goal Achievement: 04/28/20 Potential to Achieve Goals: Good    Frequency BID   Barriers to discharge        Co-evaluation               AM-PAC PT "6 Clicks" Mobility  Outcome Measure Help needed turning from your back to your side while in a flat bed without using bedrails?: A Little Help needed moving from lying on your back to sitting on the side of a flat bed without using bedrails?: A Little Help needed moving to and from a bed to a chair (including a wheelchair)?: A Little Help needed standing up from a chair using your arms (e.g., wheelchair or bedside chair)?: A Little Help needed to walk in hospital room?: A Little Help needed climbing 3-5 steps with a railing? : A Little 6 Click Score: 18    End of Session Equipment Utilized During Treatment: Gait belt Activity Tolerance: Patient tolerated treatment well Patient left: in chair;with chair alarm set;with SCD's reapplied Nurse Communication: Mobility status PT Visit Diagnosis: Difficulty in walking, not elsewhere classified (R26.2);Pain;Muscle weakness (generalized) (M62.81) Pain - Right/Left: Left Pain - part of body: Hip    Time: 3790-2409 PT Time Calculation (min) (ACUTE ONLY): 33 min   Charges:   PT Evaluation $PT Eval Low Complexity: 1 Low PT Treatments $Therapeutic Exercise: 8-22 mins        Mathew Reyes, PT, DPT [phone]   Mathew Reyes 04/14/2020, 4:59 PM

## 2020-04-14 NOTE — Anesthesia Preprocedure Evaluation (Signed)
Anesthesia Evaluation  Patient identified by MRN, date of birth, ID band Patient awake    Reviewed: Allergy & Precautions, H&P , NPO status , Patient's Chart, lab work & pertinent test results  History of Anesthesia Complications Negative for: history of anesthetic complications  Airway Mallampati: III  TM Distance: <3 FB Neck ROM: limited    Dental  (+) Chipped, Poor Dentition   Pulmonary shortness of breath and with exertion, COPD, Current Smoker and Patient abstained from smoking.,    Pulmonary exam normal        Cardiovascular Exercise Tolerance: Good hypertension, (-) angina+CHF and + DOE  (-) Past MI Normal cardiovascular exam     Neuro/Psych PSYCHIATRIC DISORDERS negative neurological ROS     GI/Hepatic Neg liver ROS, GERD  Medicated and Controlled,  Endo/Other  negative endocrine ROS  Renal/GU      Musculoskeletal  (+) Arthritis ,   Abdominal   Peds  Hematology negative hematology ROS (+)   Anesthesia Other Findings Past Medical History: No date: Abdominal hernia No date: Anxiety No date: Arthritis No date: BPH (benign prostatic hyperplasia) No date: Cardiomyopathy ([place]) No date: CHF (congestive heart failure) (HCC)     Comment:  cardiomyopathy No date: COPD (chronic obstructive pulmonary disease) (HCC)     Comment:  panlobular emphysema No date: Depression No date: DOE (dyspnea on exertion) No date: Dyspnea     Comment:  with exertion No date: GERD (gastroesophageal reflux disease) No date: Hypertension No date: Lipidemia No date: Psoriasis No date: Situational anxiety No date: SOB (shortness of breath) No date: Tobacco abuse  Past Surgical History: No date: APPENDECTOMY No date: COLONOSCOPY, ESOPHAGOGASTRODUODENOSCOPY (EGD) AND ESOPHAGEAL  DILATION 03/20/2015: ESOPHAGOGASTRODUODENOSCOPY; N/A     Comment:  Procedure: ESOPHAGOGASTRODUODENOSCOPY (EGD);  Surgeon:               Hulen Luster,  MD;  Location: [facility] ENDOSCOPY;  Service:               Gastroenterology;  Laterality: N/A; 08/28/2015: ESOPHAGOGASTRODUODENOSCOPY; N/A     Comment:  Procedure: ESOPHAGOGASTRODUODENOSCOPY (EGD);  Surgeon:               Hulen Luster, MD;  Location[facility] ENDOSCOPY;  Service:               Gastroenterology;  Laterality: N/A; 11/27/2015: ESOPHAGOGASTRODUODENOSCOPY; N/A     Comment:  Procedure: ESOPHAGOGASTRODUODENOSCOPY (EGD);  Surgeon:               Hulen Luster, MD;  Location: [facility] ENDOSCOPY;  Service:               Gastroenterology;  Laterality: N/A; No date: Hemmorrhoidectomy No date: HERNIA REPAIR No date: Throid polyp  BMI    Body Mass Index: 27.10 kg/m      Reproductive/Obstetrics negative OB ROS                             Anesthesia Physical Anesthesia Plan  ASA: III  Anesthesia Plan: Spinal   Post-op Pain Management:    Induction:   PONV Risk Score and Plan:   Airway Management Planned: Natural Airway and Nasal Cannula  Additional Equipment:   Intra-op Plan:   Post-operative Plan:   Informed Consent: I have reviewed the patients History and Physical, chart, labs and discussed the procedure including the risks, benefits and alternatives for the proposed anesthesia with the patient or authorized representative who has  indicated his/her understanding and acceptance.     Dental Advisory Given  Plan Discussed with: Anesthesiologist, CRNA and Surgeon  Anesthesia Plan Comments: (Patient reports no bleeding problems and no anticoagulant use.  Plan for spinal with backup GA  Patient consented for risks of anesthesia including but not limited to:  - adverse reactions to medications - damage to eyes, teeth, lips or other oral mucosa - nerve damage due to positioning  - risk of bleeding, infection and or nerve damage from spinal that could lead to paralysis - risk of headache or failed spinal - damage to teeth, lips or other oral mucosa - sore  throat or hoarseness - damage to heart, brain, nerves, lungs, other parts of body or loss of life  Patient voiced understanding.)        Anesthesia Quick Evaluation

## 2020-04-15 NOTE — Discharge Summary (Signed)
Physician Discharge Summary  Patient ID: Mathew Reyes MRN: [id number] DOB/AGE: 1940-12-20 79 y.o.  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 M.D.  ASSISTANT:  Cassell Smiles, PA-C (present and scrubbed throughout the case, critical for assistance with exposure, retraction, instrumentation, and closure)  ANESTHESIA: spinal  ESTIMATED BLOOD LOSS: 50 mL  FLUIDS REPLACED: 1300 mL of crystalloid  DRAINS: 2 medium drains to a Hemovac reservoir  IMPLANTS UTILIZED: DePuy 12 mm large stature AML femoral stem, 52 mm OD Pinnacle 100 acetabular component, +4 mm 10 degree Pinnacle Marathon polyethylene insert, and a 36 mm M-SPEC +5 mm hip ball Discharge Diagnoses: Patient Active Problem List   Diagnosis Date Noted  . 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 COPD (chronic obstructive pulmonary disease) ([place]) 09/03/2016  . 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 Cardiomyopathy ([place]) 02/12/2016  . Congestive heart failure ([place]) 02/12/2016  . Breathlessness on exertion 02/12/2016  . HLD (hyperlipidemia) 02/12/2016  . BP (high blood pressure) 02/12/2016  . Breath shortness 02/12/2016  . 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 Shortness of breath 10/22/2015  . H/O disease 07/02/2014    Past Medical History:  Diagnosis Date  . Abdominal hernia   . Anxiety   . Arthritis   . BPH (benign prostatic hyperplasia)   . Cardiomyopathy ([place])   . CHF (congestive heart failure) (HCC)    cardiomyopathy  . COPD (chronic obstructive pulmonary disease) (HCC)    panlobular emphysema  . Depression   . DOE (dyspnea on exertion)   . Dyspnea    with exertion  . GERD (gastroesophageal reflux disease)   . Hypertension   . Lipidemia   . Psoriasis     . Situational anxiety   . SOB (shortness of breath)   . Tobacco abuse      Transfusion:    Consultants (if any):   Discharged Condition: Improved  Hospital Course: Mathew Reyes is an 79 y.o. male who was admitted 04/14/2020 with a diagnosis of left hip osteoarthritis and went to the operating room on 04/14/2020 and underwent left total hip arthroplasty through posterior approach. The patient received perioperative antibiotics for prophylaxis (see below). The patient tolerated the procedure well and was transported to PACU in stable condition. After meeting PACU criteria, the patient was subsequently transferred to the Orthopaedics/Rehabilitation unit.   The patient received DVT prophylaxis in the form of early mobilization, Lovenox, Foot Pumps and TED hose. A sacral pad had been placed and heels were elevated off of the bed with rolled towels in order to protect skin integrity. Foley catheter was discontinued on postoperative day #0. Wound drains were discontinued on postoperative day #2. The surgical incision was healing well without signs of infection.  Physical therapy was initiated postoperatively for transfers, gait training, and strengthening. Occupational therapy was initiated for activities of daily living and evaluation for assisted devices. Rehabilitation goals were reviewed in detail with the patient. The patient made steady progress with physical therapy and physical therapy recommended discharge to Home.   The patient achieved his preliminary goals of this hospitalization and was felt to be medically and orthopaedically appropriate for discharge.  He was given perioperative antibiotics:  Anti-infectives (From admission, onward)   Start     Dose/Rate Route Frequency Ordered Stop   04/14/20  1300  ceFAZolin (ANCEF) IVPB 2g/100 mL premix     2 g 200 mL/hr over 30 Minutes Intravenous Every 6 hours 04/14/20 1244 04/15/20 0718   04/14/20 0600  ceFAZolin (ANCEF) IVPB 2g/100 mL premix   Status:  Discontinued     2 g 200 mL/hr over 30 Minutes Intravenous On call to O.R. 04/14/20 6789 04/14/20 3810    .  Recent vital signs:  Vitals:   04/15/20 1605 04/16/20 0004  BP: 133/77 129/78  Pulse: 72 72  Resp: 20 16  Temp: 97.8 F (36.6 C) 97.6 F (36.4 C)  SpO2: 93% 92%    Recent laboratory studies:  No results for input(s): WBC, HGB, HCT, PLT, K, CL, CO2, BUN, CREATININE, GLUCOSE, CALCIUM, LABPT, INR in the last 72 hours.  Diagnostic Studies: DG Hip Port Unilat With Pelvis 1V Left  Result Date: 04/14/2020 CLINICAL DATA:  Post LEFT total hip arthroplasty EXAM: DG HIP (WITH OR WITHOUT PELVIS) 1V PORT LEFT COMPARISON:  Portable exam 1146 hours without priors for comparison FINDINGS: Osseous demineralization. LEFT hip prosthesis identified in expected position. Overlying surgical drains and skin clips. No fracture or dislocation. Small areas of cortical lucency identified at the proximal femoral diaphysis on cross-table lateral view, with minimal endosteal scalloping on AP view, doubt related to surgery, suspicious for underlying bone lesion. IMPRESSION: LEFT hip prosthesis without acute fracture or dislocation. Endosteal scalloping and cortical lucency identified at proximal LEFT femoral diaphysis, suspicious for underlying bone lesion; recommendation correlation with preoperative images to assess acuity. Electronically Signed   By: Lavonia Dana M.D.   On: 04/14/2020 12:54    Discharge Medications:   Allergies as of 04/16/2020   No Known Allergies     Medication List    STOP taking these medications   aspirin EC 81 MG tablet   naproxen sodium 220 MG tablet Commonly known as: ALEVE     TAKE these medications   budesonide-formoterol 160-4.5 MCG/ACT inhaler Commonly known as: SYMBICORT Inhale 2 puffs into the lungs 2 (two) times daily.   calcium carbonate 1500 (600 Ca) MG Tabs tablet Commonly known as: OSCAL Take 600 mg of elemental calcium by mouth daily with  breakfast.   carvedilol 3.125 MG tablet Commonly known as: COREG Take 3.125 mg by mouth 2 (two) times daily.   celecoxib 200 MG capsule Commonly known as: CELEBREX Take 1 capsule (200 mg total) by mouth 2 (two) times daily.   digoxin 0.125 MG tablet Commonly known as: LANOXIN Take 0.125 mg by mouth daily.   enoxaparin 40 MG/0.4ML injection Commonly known as: LOVENOX Inject 0.4 mLs (40 mg total) into the skin daily for 14 days.   Entresto 24-26 MG Generic drug: sacubitril-valsartan Take 1 tablet by mouth in the morning and at bedtime.   finasteride 5 MG tablet Commonly known as: Proscar Take 1 tablet (5 mg total) by mouth daily.   Fish Oil 1000 MG Caps Take 1,000 mg by mouth daily.   FLUoxetine 20 MG capsule Commonly known as: PROZAC Take 20 mg by mouth daily.   levalbuterol 45 MCG/ACT inhaler Commonly known as: XOPENEX HFA Inhale 1-2 puffs into the lungs every 6 (six) hours as needed for wheezing or shortness of breath.   montelukast 10 MG tablet Commonly known as: SINGULAIR Take 10 mg by mouth at bedtime.   multivitamin with minerals Tabs tablet Take 1 tablet by mouth daily.   Na Sulfate-K Sulfate-Mg Sulf 17.5-3.13-1.6 GM/177ML Soln Take by mouth.   omeprazole 20 MG capsule Commonly  known as: PRILOSEC Take 20 mg by mouth daily.   oxyCODONE 5 MG immediate release tablet Commonly known as: Oxy IR/ROXICODONE Take 1 tablet (5 mg total) by mouth every 4 (four) hours as needed for moderate pain (pain score 4-6).   sildenafil 100 MG tablet Commonly known as: VIAGRA Take 100 mg by mouth as needed for erectile dysfunction.   tamsulosin 0.4 MG Caps capsule Commonly known as: FLOMAX Take 0.4 mg by mouth daily.   traMADol 50 MG tablet Commonly known as: ULTRAM Take 1 tablet (50 mg total) by mouth every 4 (four) hours as needed for moderate pain.   vitamin C 500 MG tablet Commonly known as: ASCORBIC ACID Take 500 mg by mouth daily.            Durable  Medical Equipment  (From admission, onward)         Start     Ordered   04/14/20 1245  DME Walker rolling  Once    Question:  Patient needs a walker to treat with the following condition  Answer:  S/P total hip arthroplasty   04/14/20 1244   04/14/20 1245  DME Bedside commode  Once    Question:  Patient needs a bedside commode to treat with the following condition  Answer:  S/P total hip arthroplasty   04/14/20 1244          Disposition: home with home health PT    Follow-up Information    Dereck Leep, MD On 05/27/2020.   Specialty: Orthopedic Surgery Why: at 9:30am Contact information: [place] [id number] [place], PA-C 04/16/2020, 7:34 AM

## 2020-04-15 NOTE — Anesthesia Postprocedure Evaluation (Signed)
Anesthesia Post Note  Patient: Mathew Reyes  Procedure(s) Performed: TOTAL HIP ARTHROPLASTY (Left Hip)  Patient location during evaluation: Nursing Unit Anesthesia Type: Spinal Level of consciousness: oriented and awake and alert Pain management: pain level controlled Vital Signs Assessment: post-procedure vital signs reviewed and stable Respiratory status: spontaneous breathing, respiratory function stable and patient connected to nasal cannula oxygen Cardiovascular status: blood pressure returned to baseline and stable Postop Assessment: no headache, no backache and no apparent nausea or vomiting Anesthetic complications: no     Last Vitals:  Vitals:   04/15/20 0349 04/15/20 0722  BP: 126/76 123/75  Pulse: (!) 54 63  Resp: 16 16  Temp: 36.4 C 36.4 C  SpO2: 95% 94%    Last Pain:  Vitals:   04/15/20 0800  TempSrc:   PainSc: 0-No pain                 Estill Batten

## 2020-04-15 NOTE — Evaluation (Signed)
Occupational Therapy Evaluation Patient Details Name: Mathew Reyes MRN: [id number] DOB: 1941/04/25 Today's Date: 04/15/2020    History of Present Illness Pt admitted for L THR and is POD 1 at time of evaluation. PMH includes COPD, HTN, and CHF.   Clinical Impression   [name] seen for OT evaluation this date, POD#1 from above surgery. Pt was independent in all ADL prior to surgery, however occasionally using 4WW for community ambulation due to L hip pain. Pt is eager to return to PLOF with less pain and improved safety and independence. Pt deferred functional mobility and ADL this session. Given observed pt performance during PT session, anticipate Mod I for all seated UB ADL, initial Min A and verbal cues for LB dressing using LRAD and Mod A for UB/LB bathing due to pain and limited AROM of R hip. Pt able to recall 3/3 posterior total hip precautions at start of session with education on how to implement during ADL and mobility. Pt instructed in posterior total hip precautions and how to implement, self care skills, falls prevention strategies, home/routines modifications, DME/AE for LB bathing and dressing tasks, compression stocking mgt, and car transfer techniques. At end of session, pt able to recall 3/3 posterior total hip precautions. Pt would benefit from additional instruction in self care skills and techniques to help maintain precautions with or without assistive devices to support recall and carryover prior to discharge. Recommend no OT follow up upon discharge.      Follow Up Recommendations  No OT follow up    Equipment Recommendations  None recommended by OT    Recommendations for Other Services       Precautions / Restrictions Precautions Precautions: Posterior Hip;Fall Precaution Booklet Issued: Yes (comment) Precaution Comments: Handout provided Restrictions Weight Bearing Restrictions: Yes LLE Weight Bearing: Weight bearing as tolerated      Mobility  Bed Mobility               General bed mobility comments: Pt was seated in recliner pre/post session  Transfers         General transfer comment: Pt deferred t/f and functional mobility    Balance Overall balance assessment: Modified Independent Sitting-balance support: Feet supported Sitting balance-[name] Scale: Good                               ADL either performed or assessed with clinical judgement   ADL Overall ADL's : Needs assistance/impaired                                       General ADL Comments: Pt deferred functional mobility and ADL this session. Given observed pt performance during PT session, pt ambulates with Mod I-CGA; anticipate Mod I for all seated UB ADL, initial Min A and verbal cues for LB dressing using LRAD and Mod A for UB/LB bathing.     Vision Baseline Vision/History: Wears glasses Wears Glasses: At all times Patient Visual Report: No change from baseline       Perception     Praxis      Pertinent Vitals/Pain Pain Assessment: No/denies pain(during sitting) Pain Intervention(s): Monitored during session     Hand Dominance     Extremity/Trunk Assessment Upper Extremity Assessment Upper Extremity Assessment: Overall WFL for tasks assessed   Lower Extremity Assessment Lower Extremity Assessment: LLE deficits/detail  LLE Deficits / Details: Anticipated post-op deficits in strength/ROM       Communication Communication Communication: No difficulties   Cognition Arousal/Alertness: Awake/alert Behavior During Therapy: WFL for tasks assessed/performed Overall Cognitive Status: Within Functional Limits for tasks assessed                                 General Comments: Pt is A and O x 4 and agreeable and cooperative throughout. Able to recall 3/3 hip precautions at start and finish of session   General Comments       Exercises Other Exercises Other Exercises: Pt educated re: role of  OT and recovery process, posterior THP in the context of ADL, safe AE/DME use, compression stocking mgt and car transfers. He was engaged and receptive throughout; will benefit from additional practice and reinforcement. Handout provided.   Shoulder Instructions      Home Living Family/patient expects to be discharged to:: Private residence Living Arrangements: Spouse/significant other(girlfriend) Available Help at Discharge: Family;Available 24 hours/day Type of Home: House Home Access: Stairs to enter [employer] of Steps: 5 Entrance Stairs-Rails: Can reach both Home Layout: Two level;Able to live on main level with bedroom/bathroom     Bathroom Shower/Tub: Walk-in shower         Home Equipment: Environmental consultant - 2 wheels;Cane - single point;Shower seat          Prior Functioning/Environment Level of Independence: Independent        Comments: RW PRN for long distances, is indep in ADL and IADL, still drives and works part time for [employer] as a Education officer, community. Pt enjoys beach music and going out to eat. Reports 0 falls in the past 12 months.        OT Problem List: Decreased strength;Decreased range of motion;Decreased knowledge of precautions;Decreased knowledge of use of DME or AE      OT Treatment/Interventions: Self-care/ADL training;Therapeutic exercise;Therapeutic activities;DME and/or AE instruction;Patient/family education    OT Goals(Current goals can be found in the care plan section) Acute Rehab OT Goals Patient Stated Goal: To go home OT Goal Formulation: With patient Time For Goal Achievement: 04/29/20 Potential to Achieve Goals: Good ADL Goals Pt Will Perform Lower Body Dressing: with modified independence;sit to/from stand;with adaptive equipment(using LRAD and indep maintaining L THP) Pt Will Transfer to Toilet: with modified independence;bedside commode;regular height toilet;ambulating(elevated toilet height; using LRAD and indep maintaining L  THP) Additional ADL Goal #1: Pt will independently instruct caregiver in compression stocking mgt to optimize pt outcomes and reduce caregiver burden  OT Frequency: Min 2X/week   Barriers to D/C:            Co-evaluation              AM-PAC OT "6 Clicks" Daily Activity     Outcome Measure Help from another person eating meals?: None Help from another person taking care of personal grooming?: None Help from another person toileting, which includes using toliet, bedpan, or urinal?: None Help from another person bathing (including washing, rinsing, drying)?: A Lot Help from another person to put on and taking off regular upper body clothing?: None Help from another person to put on and taking off regular lower body clothing?: A Little 6 Click Score: 21   End of Session    Activity Tolerance: Patient tolerated treatment well Patient left: in chair;with call bell/phone within reach;with chair alarm set  OT Visit Diagnosis: Other abnormalities  of gait and mobility (R26.89)                Time: 5462-7035 OT Time Calculation (min): 26 min Charges:  OT General Charges $OT Visit: 1 Visit OT Evaluation $OT Eval Low Complexity: 1 Low OT Treatments $Self Care/Home Management : 8-22 mins  Jerilynn Birkenhead, OTS 04/15/20, 11:12 AM

## 2020-04-15 NOTE — Progress Notes (Signed)
pts bathroom light was flashing.  Pt was out of br and  amb  Back to chair  When I entered room. He was using walker  But not safely.  Instructed in use . Pt stumbled and was unsafe going  Back to  Chair. Nurse maintained  Contact guard  On pt   And instructed again to call nurse  Before getting up.

## 2020-04-15 NOTE — Progress Notes (Signed)
Physical Therapy Treatment Patient Details Name: Mathew Reyes MRN: [id number] DOB: 29-Jun-1941 Today's Date: 04/15/2020    History of Present Illness Pt admitted for L THR and is POD 1 at time of evaluation. PMH includes COPD, HTN, and CHF.    PT Comments    Pt was in recliner with spouse at bedside upon arriving. He agrees to PT session and continues to report feeling well. Reports no pain " just feels tight." He continues to progress well with PT and was able to stand and ambulate without assistance x 200 ft. He also demonstrated safe ability to ascend/descend  Stairs with supervision only. NO LOB or difficulty with safe functional mobility throughout. Both pt/pt's spouse feels good about DC to home when stable. He will benefit from HHPT to address strength deficits and assist pt to returning to PLOF.    Follow Up Recommendations  Home health PT     Equipment Recommendations  None recommended by PT    Recommendations for Other Services       Precautions / Restrictions Precautions Precautions: Posterior Hip;Fall Precaution Booklet Issued: Yes (comment) Precaution Comments: Handout provided Restrictions Weight Bearing Restrictions: Yes LLE Weight Bearing: Weight bearing as tolerated    Mobility  Bed Mobility               General bed mobility comments: pt was seated in recliner pre/post session  Transfers Overall transfer level: Modified independent Equipment used: Rolling walker (2 wheeled) Transfers: Sit to/from Stand Sit to Stand: Modified independent (Device/Increase time)         General transfer comment: Pt deferred t/f and functional mobility  Ambulation/Gait Ambulation/Gait assistance: Modified independent (Device/Increase time) Gait Distance (Feet): 200 Feet Assistive device: Rolling walker (2 wheeled) Gait Pattern/deviations: Step-through pattern Gait velocity: WNL   General Gait Details: pt demonstrated safe steady gait  kinematics   Stairs Stairs: Yes Stairs assistance: Supervision Stair Management: Two rails Number of Stairs: 4 General stair comments: Pt demonstrated safe ability ascend/descend 4 stair with BUE support. pt performed step to pattern without difficulty   Wheelchair Mobility    Modified Rankin (Stroke Patients Only)       Balance Overall balance assessment: Modified Independent Sitting-balance support: Feet supported Sitting balance-[name] Scale: Good     Standing balance support: Bilateral upper extremity supported;During functional activity Standing balance-[name] Scale: Good                              Cognition Arousal/Alertness: Awake/alert Behavior During Therapy: WFL for tasks assessed/performed Overall Cognitive Status: Within Functional Limits for tasks assessed                                 General Comments: Pt is A and O x 4 and agreeable and cooperative throughout. Able to recall 3/3 hip precautions and adhere throughout      Exercises Other Exercises Other Exercises: Pt educated re: role of OT and recovery process, posterior THP in the context of ADL, safe AE/DME use, compression stocking mgt and car transfers. He was engaged and receptive throughout; will benefit from additional practice and reinforcement. Handout provided.    General Comments        Pertinent Vitals/Pain Pain Assessment: No/denies pain Pain Score: 0-No pain Pain Location: L hip Pain Descriptors / Indicators: Tightness Pain Intervention(s): Limited activity within patient's tolerance;Monitored during session;Premedicated before session;Ice applied;Repositioned  Home Living Family/patient expects to be discharged to:: Private residence Living Arrangements: Spouse/significant other(girlfriend) Available Help at Discharge: Family;Available 24 hours/day Type of Home: House Home Access: Stairs to enter Entrance Stairs-Rails: Can reach both Home Layout:  Two level;Able to live on main level with bedroom/bathroom Home Equipment: Gilford Rile - 2 wheels;Cane - single point;Shower seat      Prior Function Level of Independence: Independent      Comments: RW PRN for long distances, is indep in ADL and IADL, still drives and works part time for [employer] as a Education officer, community. Pt enjoys beach music and going out to eat. Reports 0 falls in the past 12 months.   PT Goals (current goals can now be found in the care plan section) Acute Rehab PT Goals Patient Stated Goal: To go home Progress towards PT goals: Progressing toward goals    Frequency    BID      PT Plan Current plan remains appropriate    Co-evaluation              AM-PAC PT "6 Clicks" Mobility   Outcome Measure  Help needed turning from your back to your side while in a flat bed without using bedrails?: A Little Help needed moving from lying on your back to sitting on the side of a flat bed without using bedrails?: A Little Help needed moving to and from a bed to a chair (including a wheelchair)?: A Little Help needed standing up from a chair using your arms (e.g., wheelchair or bedside chair)?: A Little Help needed to walk in hospital room?: A Little Help needed climbing 3-5 steps with a railing? : A Little 6 Click Score: 18    End of Session Equipment Utilized During Treatment: Gait belt Activity Tolerance: Patient tolerated treatment well Patient left: in chair;with chair alarm set;with SCD's reapplied;with family/visitor present Nurse Communication: Mobility status PT Visit Diagnosis: Difficulty in walking, not elsewhere classified (R26.2);Pain;Muscle weakness (generalized) (M62.81) Pain - Right/Left: Left Pain - part of body: Hip     Time: 0177-9390 PT Time Calculation (min) (ACUTE ONLY): 20 min  Charges:  $Gait Training: 8-22 mins                     Julaine Fusi PTA 04/15/20, 1:50 PM

## 2020-04-15 NOTE — Progress Notes (Signed)
Pt  Instructed again to call nurse or tech if he needed to be  Up oob  For assist to bathroom. He said he would. Wife in room at the time  Without mask on. Instructed her to put mask on  And explained why she needed to wear one.

## 2020-04-15 NOTE — TOC Initial Note (Signed)
Transition of Care [facility]) - Initial/Assessment Note    Patient Details  Name: Mathew Reyes MRN: [id number] Date of Birth: [birth year]  [facility]) CM/SW Contact:    Su Hilt, RN Phone Number: 04/15/2020, 8:52 AM  Clinical Narrative:        Met with the patient at the bedside to discuss DC plan and needs He lives at home with his Girlfriend He has a RW and Rolator as well as a cane, He stated he does not need a 3 in 1 He has transportation to and from the Doctor provided by his Girlfriend He can afford his medications and gets them at [facility] or [facility] I have requested the price of Lovenox and will provide the cost once obtained He is set [facility] with [facility] services            Expected Discharge Plan: [place] Barriers to Discharge: Continued Medical Work up   Patient Goals and CMS Choice Patient states their goals for this hospitalization and ongoing recovery are:: go home      Expected Discharge Plan and Services Expected Discharge Plan: [place]       Living arrangements for the past 2 months: Single Family Home                 DME Arranged: N/A         HH Arranged: PT, OT HH Agency: [facility] [employer] Date [place]: 04/15/20 Time [place]: [phone] Representative spoke with at [place]: [place] Arrangements/Services Living arrangements for the past 2 months: [place] Lives with:: Significant Other Patient language and need for interpreter reviewed:: Yes Do you feel safe going back to the place where you live?: Yes      Need for Family Participation in Patient Care: No (Comment) Care giver support system in place?: Yes (comment) Current home services: DME(rolator, RW, cane) Criminal Activity/Legal Involvement Pertinent to Current Situation/Hospitalization: No - Comment as needed  Activities of Daily Living       Permission Sought/Granted   Permission granted to share information with : Yes, Verbal Permission Granted              Emotional Assessment Appearance:: Appears stated age Attitude/Demeanor/Rapport: Engaged Affect (typically observed): Appropriate Orientation: : Oriented to Situation, Oriented to  Time, Oriented to Place, Oriented to Self Alcohol / Substance Use: Not Applicable Psych Involvement: No (comment)  Admission diagnosis:  H/O total hip arthroplasty [Z96.649] Patient Active Problem List   Diagnosis Date Noted  . 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 COPD (chronic obstructive pulmonary disease) ([place]) 09/03/2016  . 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 Cardiomyopathy ([place]) 02/12/2016  . Congestive heart failure ([place]) 02/12/2016  . Breathlessness on exertion 02/12/2016  . HLD (hyperlipidemia) 02/12/2016  . BP (high blood pressure) 02/12/2016  . Breath shortness 02/12/2016  . 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 Shortness of breath 10/22/2015  . H/O disease 07/02/2014   PCP:  Idelle Crouch, MD Pharmacy:   [facility], [place] [address] Phone: [phone] Fax: [place], [place] [address] [place]  [place] [address] Phone: [phone] Fax: [phone]     Social Determinants of Health (SDOH) Interventions    Readmission Risk Interventions No flowsheet data found.

## 2020-04-15 NOTE — Progress Notes (Signed)
  Subjective: 1 Day Post-Op Procedure(s) (LRB): TOTAL HIP ARTHROPLASTY (Left) Patient reports pain as well-controlled.   Patient is well, and has had no acute complaints or problems Plan is to go Home after hospital stay. Negative for chest pain and shortness of breath Fever: no Gastrointestinal: negative for nausea and vomiting.   Patient has had a bowel movement.  Objective: Vital signs in last 24 hours: Temp:  [97 F (36.1 C)-98 F (36.7 C)] 97.6 F (36.4 C) (06/08 0722) Pulse Rate:  [54-90] 63 (06/08 0722) Resp:  [10-19] 16 (06/08 0722) BP: (112-156)/(73-99) 123/75 (06/08 0722) SpO2:  [90 %-98 %] 94 % (06/08 0722)  Intake/Output from previous day:  Intake/Output Summary (Last 24 hours) at 04/15/2020 0853 Last data filed at 04/15/2020 0724 Gross per 24 hour  Intake 3509.03 ml  Output 1925 ml  Net 1584.03 ml    Intake/Output this shift: Total I/O In: -  Out: 50 [Drains:50]  Labs: No results for input(s): HGB in the last 72 hours. No results for input(s): WBC, RBC, HCT, PLT in the last 72 hours. No results for input(s): NA, K, CL, CO2, BUN, CREATININE, GLUCOSE, CALCIUM in the last 72 hours. No results for input(s): LABPT, INR in the last 72 hours.   EXAM General - Patient is Alert, Appropriate and Oriented Extremity - Neurovascular intact Dorsiflexion/Plantar flexion intact Compartment soft Dressing/Incision -clean, dry, minimal sanguinous drainage Motor Function - intact, moving foot and toes well on exam.  Cardiovascular- Regular rate and rhythm, no murmurs/rubs/gallops Respiratory- Lungs clear to auscultation bilaterally Gastrointestinal- soft and nontender   Assessment/Plan: 1 Day Post-Op Procedure(s) (LRB): TOTAL HIP ARTHROPLASTY (Left) Active Problems:   H/O total hip arthroplasty  Estimated body mass index is 27.1 kg/m as calculated from the following:   Height as of 04/04/20: 5\' 7"  (1.702 m).   Weight as of this encounter: 78.5 kg. Advance diet Up  with therapy Plan for discharge tomorrow    DVT Prophylaxis - Lovenox, Ted hose and foot pumps Weight-Bearing as tolerated to left leg  Cassell Smiles, PA-C [facility] Orthopaedic Surgery 04/15/2020, 8:53 AM

## 2020-04-15 NOTE — Progress Notes (Signed)
Responded to patient's chair alarm ringing. Upon entering the room patient was found in the bathroom unassisted by nursing. Patient and visitor instructed on calling the nursing staff for assistance with getting up. Patient found to be unsafe while using the walker. Patient instructed on safe use of the walker.

## 2020-04-15 NOTE — TOC Benefit Eligibility Note (Signed)
Transition of Care [facility]) Benefit Eligibility Note    Patient Details  Name: Mathew Reyes MRN: [id number] Date of Birth: 03-May-1941   Medication/Dose: LOVENOX  40 MG  SQ DAILY X 14 DAYS  Covered?: Yes  Tier: (NO TIER)  Prescription Coverage [facility]erred [facility]acy: [facility]  and   [facility]  Spoke with Person/Company/Phone Number:: SONIA   @ [employer] # [phone]  Co-Pay: [facility]  Prior Approval: No  Deductible: (NO DEDUCTIBLE  and  NO OUT-OF-POCKET)  Additional Notes: ENOXAPARIN 40 MG SQ DAILY X [place]- NO  P/A-NO    Memory Argue Phone Number: 04/15/2020, 11:25 AM

## 2020-04-15 NOTE — Progress Notes (Signed)
Physical Therapy Treatment Patient Details Name: Mathew Reyes MRN: [id number] DOB: Sep 24, 1941 Today's Date: 04/15/2020    History of Present Illness Pt admitted for L THR and is POD 0 at time of evaluation. PMH includes COPD, HTN, and CHF.    PT Comments    Pt was seated in recliner upon arriving. He agrees to PT session and is cooperative and pleasant. Reviewed hip precautions. Pt able to describe 2/3 but was able to adhere throughout session. He was able to stand without assistance and ambulate 200 ft without difficulty of unsteadiness. 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 Pt able to perform with vcs only. Overall pt is progressing well towards PT goals. Recommend DC to home with HHPT to follow. Will perform stair training in PM session.      Follow Up Recommendations  Home health PT     Equipment Recommendations  None recommended by PT    Recommendations for Other Services       Precautions / Restrictions Precautions Precautions: Posterior Hip;Fall Precaution Booklet Issued: Yes (comment) Restrictions Weight Bearing Restrictions: Yes LLE Weight Bearing: Weight bearing as tolerated    Mobility  Bed Mobility               General bed mobility comments: Pt was seated in recliner pre/opost session  Transfers Overall transfer level: Modified independent Equipment used: Rolling walker (2 wheeled) Transfers: Sit to/from Stand Sit to Stand: Modified independent (Device/Increase time)         General transfer comment: Pt demonstrated safe ability to STS from EOB. reminded on hip precautions  Ambulation/Gait Ambulation/Gait assistance: Modified independent (Device/Increase time) Gait Distance (Feet): 200 Feet Assistive device: Rolling walker (2 wheeled) Gait Pattern/deviations: Step-through pattern Gait velocity: WNL   General Gait Details: pt demonstrated safe steady gait kinematics   Stairs Stairs: (discussed stair navigation... will  trial this afternoon)           Wheelchair Mobility    Modified Rankin (Stroke Patients Only)       Balance Overall balance assessment: Modified Independent Sitting-balance support: Feet supported Sitting balance-[name] Scale: Good     Standing balance support: Bilateral upper extremity supported;During functional activity Standing balance-[name] Scale: Good                              Cognition Arousal/Alertness: Awake/alert Behavior During Therapy: WFL for tasks assessed/performed Overall Cognitive Status: Within Functional Limits for tasks assessed                                 General Comments: Pt is A and O x 4 and agreeable and cooperative throughout. able to recall 2/3 hip precautions      Exercises Total Joint Exercises Ankle Circles/Pumps: AROM;10 reps Quad Sets: AROM;10 reps Gluteal Sets: AROM;10 reps Towel Squeeze: AROM;10 reps Short Arc Quad: AROM;10 reps Heel Slides: AROM;10 reps(through minimal ROM) Hip ABduction/ADduction: AROM;10 reps    General Comments        Pertinent Vitals/Pain Pain Assessment: No/denies pain Pain Score: 3  Pain Location: L hip Pain Descriptors / Indicators: Operative site guarding;Discomfort Pain Intervention(s): Limited activity within patient's tolerance;Monitored during session;Premedicated before session;Repositioned;Ice applied    Home Living                      Prior Function  PT Goals (current goals can now be found in the care plan section) Acute Rehab PT Goals Patient Stated Goal: to go home Progress towards PT goals: Progressing toward goals    Frequency    BID      PT Plan Current plan remains appropriate    Co-evaluation              AM-PAC PT "6 Clicks" Mobility   Outcome Measure  Help needed turning from your back to your side while in a flat bed without using bedrails?: A Little Help needed moving from lying on your back to sitting  on the side of a flat bed without using bedrails?: A Little Help needed moving to and from a bed to a chair (including a wheelchair)?: A Little Help needed standing up from a chair using your arms (e.g., wheelchair or bedside chair)?: A Little Help needed to walk in hospital room?: A Little Help needed climbing 3-5 steps with a railing? : A Little 6 Click Score: 18    End of Session Equipment Utilized During Treatment: Gait belt Activity Tolerance: Patient tolerated treatment well Patient left: in chair;with chair alarm set;with SCD's reapplied Nurse Communication: Mobility status PT Visit Diagnosis: Difficulty in walking, not elsewhere classified (R26.2);Pain;Muscle weakness (generalized) (M62.81) Pain - Right/Left: Left Pain - part of body: Hip     Time: 6579-0383 PT Time Calculation (min) (ACUTE ONLY): 16 min  Charges:  $Gait Training: 8-22 mins                     Julaine Fusi PTA 04/15/20, 10:12 AM

## 2020-04-15 NOTE — Plan of Care (Signed)
  Problem: Education: Goal: Understanding of discharge needs will improve Outcome: Progressing   Problem: Activity: Goal: Ability to avoid complications of mobility impairment will improve Outcome: Progressing   Problem: Pain Management: Goal: Pain level will decrease with appropriate interventions Outcome: Progressing   Problem: Skin Integrity: Goal: Will show signs of wound healing Outcome: Progressing

## 2020-04-16 LAB — SURGICAL PATHOLOGY

## 2020-04-16 MED ORDER — CELECOXIB 200 MG PO CAPS: 200.0000 mg | ORAL_CAPSULE | Freq: Two times a day (BID) | ORAL | 0 refills | Status: DC

## 2020-04-16 MED ORDER — OXYCODONE HCL 5 MG PO TABS: 5.0000 mg | ORAL_TABLET | ORAL | 0 refills | Status: DC | PRN

## 2020-04-16 MED ORDER — ENOXAPARIN SODIUM 40 MG/0.4ML ~~LOC~~ SOLN
40.0000 mg | SUBCUTANEOUS | 0 refills | Status: DC
Start: 2020-04-16 — End: 2020-09-16

## 2020-04-16 MED ORDER — TRAMADOL HCL 50 MG PO TABS: 50.0000 mg | ORAL_TABLET | ORAL | 0 refills | Status: DC | PRN

## 2020-04-16 NOTE — TOC Transition Note (Signed)
Transition of Care [facility]) - CM/SW Discharge Note   Patient Details  Name: Mathew Reyes MRN: [id number] Date of Birth: 01-04-1941  [facility] CM/SW Contact:  Su Hilt, RN Phone Number: 04/16/2020, 8:35 AM   Clinical Narrative:     Patient to DC home today he has DME at home and no additional needed, he is set [facility] with [facility] services, Lovenox copay is $0  Final next level of care: [place] Barriers to Discharge: Barriers Resolved   Patient Goals and CMS Choice Patient states their goals for this hospitalization and ongoing recovery are:: go home      Discharge Placement                       Discharge Plan and Services                DME Arranged: N/A         HH Arranged: PT, OT [place] Agency: [facility] (formerly [employer]) Date [place]: 04/15/20 Time [place]: [phone] Representative spoke with at [place]: [place] ([place]) Interventions     Readmission Risk Interventions No flowsheet data found.

## 2020-04-16 NOTE — Progress Notes (Signed)
  Subjective: 2 Days Post-Op Procedure(s) (LRB): TOTAL HIP ARTHROPLASTY (Left) Patient reports pain as well-controlled.   Patient is well, but has had some minor complaints of cough Plan is to go Home after hospital stay. Negative for chest pain and shortness of breath Fever: no Gastrointestinal: negative for nausea and vomiting.   Patient has had a bowel movement.  Objective: Vital signs in last 24 hours: Temp:  [97.4 F (36.3 C)-97.8 F (36.6 C)] 97.6 F (36.4 C) (06/09 0004) Pulse Rate:  [66-72] 72 (06/09 0004) Resp:  [16-20] 16 (06/09 0004) BP: (123-133)/(77-78) 129/78 (06/09 0004) SpO2:  [92 %-94 %] 92 % (06/09 0004)  Intake/Output from previous day:  Intake/Output Summary (Last 24 hours) at 04/16/2020 0731 Last data filed at 04/16/2020 0653 Gross per 24 hour  Intake 840 ml  Output 1830 ml  Net -990 ml    Intake/Output this shift: No intake/output data recorded.  Labs: No results for input(s): HGB in the last 72 hours. No results for input(s): WBC, RBC, HCT, PLT in the last 72 hours. No results for input(s): NA, K, CL, CO2, BUN, CREATININE, GLUCOSE, CALCIUM in the last 72 hours. No results for input(s): LABPT, INR in the last 72 hours.   EXAM General - Patient is Alert, Appropriate and Oriented Extremity - Neurovascular intact Dorsiflexion/Plantar flexion intact Compartment soft Dressing/Incision -clean, dry, mild sanguinous drainage  Motor Function - intact, moving foot and toes well on exam.  Cardiovascular- Regular rate and rhythm, no murmurs/rubs/gallops Respiratory- Lungs clear to auscultation bilaterally Gastrointestinal- soft and nontender   Assessment/Plan: 2 Days Post-Op Procedure(s) (LRB): TOTAL HIP ARTHROPLASTY (Left) Active Problems:   H/O total hip arthroplasty  Estimated body mass index is 27.1 kg/m as calculated from the following:   Height as of 04/04/20: 5\' 7"  (1.702 m).   Weight as of this encounter: 78.5 kg. Advance diet Up with  therapy Discharge home with home health  Hemovac removed.   DVT Prophylaxis - Lovenox, Ted hose and foot pumps Weight-Bearing as tolerated to left leg  Cassell Smiles, PA-C [facility] 04/16/2020, 7:31 AM

## 2020-04-16 NOTE — Plan of Care (Signed)
  Problem: Education: Goal: Understanding of discharge needs will improve Outcome: Progressing   Problem: Activity: Goal: Ability to avoid complications of mobility impairment will improve Outcome: Progressing   Problem: Pain Management: Goal: Pain level will decrease with appropriate interventions Outcome: Progressing   Problem: Skin Integrity: Goal: Will show signs of wound healing Outcome: Progressing

## 2020-04-16 NOTE — Progress Notes (Signed)
Pt for discharge home after seeing p.t. a/o no resp distress.  Instructions discussed with pt. Pt gave self lovenox injection and performed well. Educated on this.   Verbalize understanding of discharge  Plans and  F/u.  ., meds / diet activity care   Discussed. Ready for discharge

## 2020-09-16 ENCOUNTER — Encounter: Payer: Self-pay | Admitting: Emergency Medicine

## 2020-09-16 ENCOUNTER — Other Ambulatory Visit: Payer: Self-pay

## 2020-09-16 ENCOUNTER — Ambulatory Visit
Admission: EM | Admit: 2020-09-16 | Discharge: 2020-09-16 | Disposition: A | Discharge status: Home / Self Care | Payer: BLUE CROSS/BLUE SHIELD | Attending: Family Medicine | Admitting: Family Medicine

## 2020-09-16 DIAGNOSIS — J441 Chronic obstructive pulmonary disease with (acute) exacerbation: Secondary | ICD-10-CM | POA: Diagnosis not present

## 2020-09-16 MED ORDER — AMOXICILLIN-POT CLAVULANATE 875-125 MG PO TABS: 1.0000 | ORAL_TABLET | Freq: Two times a day (BID) | ORAL | 0 refills | Status: AC

## 2020-09-16 MED ORDER — PREDNISONE 50 MG PO TABS: ORAL_TABLET | ORAL | 0 refills | Status: DC

## 2020-09-16 NOTE — Discharge Instructions (Signed)
Continue your inhalers.  Medications as prescribed.  Take care  Dr. Lacinda Axon

## 2020-09-16 NOTE — ED Provider Notes (Signed)
[facility]    CSN: [id number] Arrival date & time: 09/16/20  1150 History   Chief Complaint Chief Complaint  Patient presents with  . Cough   HPI  79 year old male presents with cough, SOB.  4-5 day history of cough (productive). Associated SOB.  No fever.  His significant other is also sick.  He has known COPD.  He has concomitant heart failure as well.  He was recently given a steroid injection and has been taking Tessalon Perles without relief.  No other associated symptoms.  No other complaints concerns this time.  Past Medical History:  Diagnosis Date  . Abdominal hernia   . Anxiety   . Arthritis   . BPH (benign prostatic hyperplasia)   . Cardiomyopathy ([place])   . CHF (congestive heart failure) (HCC)    cardiomyopathy  . COPD (chronic obstructive pulmonary disease) (HCC)    panlobular emphysema  . Depression   . DOE (dyspnea on exertion)   . Dyspnea    with exertion  . GERD (gastroesophageal reflux disease)   . Hypertension   . Lipidemia   . Psoriasis   . Situational anxiety   . SOB (shortness of breath)   . Tobacco abuse     Patient Active Problem List   Diagnosis Date Noted  . 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 COPD (chronic obstructive pulmonary disease) ([place]) 09/03/2016  . 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 Cardiomyopathy ([place]) 02/12/2016  . Congestive heart failure ([place]) 02/12/2016  . Breathlessness on exertion 02/12/2016  . HLD (hyperlipidemia) 02/12/2016  . BP (high blood pressure) 02/12/2016  . Breath shortness 02/12/2016  . 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 Shortness of breath 10/22/2015  . H/O disease 07/02/2014    Past Surgical History:  Procedure Laterality Date  . APPENDECTOMY    . COLONOSCOPY, ESOPHAGOGASTRODUODENOSCOPY (EGD) AND ESOPHAGEAL DILATION    . ESOPHAGOGASTRODUODENOSCOPY N/A 03/20/2015   Procedure:  ESOPHAGOGASTRODUODENOSCOPY (EGD);  Surgeon: Hulen Luster, MD;  Location: [facility] ENDOSCOPY;  Service: Gastroenterology;  Laterality: N/A;  . ESOPHAGOGASTRODUODENOSCOPY N/A 08/28/2015   Procedure: ESOPHAGOGASTRODUODENOSCOPY (EGD);  Surgeon: Hulen Luster, MD;  Location[facility] ENDOSCOPY;  Service: Gastroenterology;  Laterality: N/A;  . ESOPHAGOGASTRODUODENOSCOPY N/A 11/27/2015   Procedure: ESOPHAGOGASTRODUODENOSCOPY (EGD);  Surgeon: Hulen Luster, MD;  Location: [facility] ENDOSCOPY;  Service: Gastroenterology;  Laterality: N/A;  . Hemmorrhoidectomy    . HERNIA REPAIR    . Throid polyp    . TOTAL HIP ARTHROPLASTY Left 04/14/2020   Procedure: TOTAL HIP ARTHROPLASTY;  Surgeon: Dereck Leep, MD;  Location: [facility] ORS;  Service: Orthopedics;  Laterality: Left;       Home Medications    Prior to Admission medications   Medication Sig Start Date End Date Taking? Authorizing Provider  budesonide-formoterol (SYMBICORT) 160-4.5 MCG/ACT inhaler Inhale 2 puffs into the lungs 2 (two) times daily.   Yes [provider]  calcium carbonate (OSCAL) 1500 (600 Ca) MG TABS tablet Take 600 mg of elemental calcium by mouth daily with breakfast.   Yes [provider]  carvedilol (COREG) 3.125 MG tablet Take 3.125 mg by mouth 2 (two) times daily.    Yes [provider]  digoxin (LANOXIN) 0.125 MG tablet Take 0.125 mg by mouth daily.    Yes [provider]  ENTRESTO 24-26 MG Take 1 tablet by mouth in the morning and at bedtime. 02/25/20  Yes [provider]  finasteride (PROSCAR) 5 MG tablet Take 1 tablet (5 mg total) by mouth daily. 08/02/16  Yes McGowan, Larene [place] A, PA-C  FLUoxetine (PROZAC) 20 MG capsule Take 20 mg by mouth daily.   Yes [provider]  levalbuterol (XOPENEX HFA) 45 MCG/ACT inhaler Inhale 1-2 puffs into the lungs every 6 (six) hours as needed for wheezing or shortness of breath.  12/28/18  Yes [provider]  Multiple Vitamin (MULTIVITAMIN WITH MINERALS) TABS  tablet Take 1 tablet by mouth daily.   Yes [provider]  Omega-3 Fatty Acids (FISH OIL) 1000 MG CAPS Take 1,000 mg by mouth daily.   Yes [provider]  omeprazole (PRILOSEC) 20 MG capsule Take 20 mg by mouth daily.  01/26/16 09/16/20 Yes [provider]  sildenafil (VIAGRA) 100 MG tablet Take 100 mg by mouth as needed for erectile dysfunction.    Yes [provider]  vitamin C (ASCORBIC ACID) 500 MG tablet Take 500 mg by mouth daily.   Yes [provider]  amoxicillin-clavulanate (AUGMENTIN) 875-125 MG tablet Take 1 tablet by mouth 2 (two) times daily for 7 days. 09/16/20 09/23/20  Coral Spikes, DO  montelukast (SINGULAIR) 10 MG tablet Take 10 mg by mouth at bedtime.  09/25/18   [provider]  Na Sulfate-K Sulfate-Mg Sulf 17.5-3.13-1.6 GM/177ML SOLN Take by mouth. 01/09/19   [provider]  predniSONE (DELTASONE) 50 MG tablet 1 tablet daily x 5 days 09/16/20   Coral Spikes, DO  enoxaparin (LOVENOX) 40 MG/0.4ML injection Inject 0.4 mLs (40 mg total) into the skin daily for 14 days. 04/16/20 09/16/20  Fausto Skillern, PA-C    Family History Family History  Problem Relation Age of Onset  . Alzheimer's disease Mother   . Aortic aneurysm Father   . Kidney disease Neg Hx   . Prostate cancer Neg Hx     Social History Social History   Tobacco Use  . Smoking status: Current Every Day Smoker    Packs/day: 1.50    Years: 50.00    Pack years: 75.00    Types: Cigarettes  . Smokeless tobacco: Never Used  . Tobacco comment: pt has been smoking since he was 79 years old!!  Vaping Use  . Vaping Use: Never used  Substance Use Topics  . Alcohol use: Yes    Alcohol/week: 0.0 standard drinks    Comment: socially  . Drug use: No     Allergies   Patient has no known allergies.   Review of Systems Review of Systems  Constitutional: Negative for fever.  Respiratory: Positive for cough and shortness of breath.    Physical  Exam Triage Vital Signs ED Triage Vitals  Enc Vitals Group     BP 09/16/20 1237 (!) 154/91     Pulse Rate 09/16/20 1237 77     Resp 09/16/20 1237 18     Temp 09/16/20 1237 98.5 F (36.9 C)     Temp Source 09/16/20 1237 Oral     SpO2 09/16/20 1237 96 %     Weight 09/16/20 1234 173 lb 1 oz (78.5 kg)     Height 09/16/20 1234 5\' 7"  (1.702 m)     Head Circumference --      Peak Flow --      Pain Score 09/16/20 1234 0     Pain Loc --      Pain Edu? --      Excl. in [place]? --    Updated Vital Signs  BP (!) 154/91 (BP Location: Left Arm)   Pulse 77   Temp 98.5 F (36.9 C) (Oral)   Resp 18   Ht 5\' 7"  (1.702 m)   Wt 78.5 kg   SpO2 96%   BMI 27.11 kg/m   Visual Acuity Right Eye Distance:   Left Eye Distance:   Bilateral Distance:    Right Eye Near:   Left Eye Near:    Bilateral Near:     Physical Exam Vitals and nursing note reviewed.  Constitutional:      General: He is not in acute distress.    Appearance: Normal appearance. He is not ill-appearing.  HENT:     Head: Normocephalic and atraumatic.  Eyes:     General:        Right eye: No discharge.        Left eye: No discharge.     Conjunctiva/sclera: Conjunctivae normal.  Cardiovascular:     Rate and Rhythm: Normal rate and regular rhythm.  Pulmonary:     Effort: Pulmonary effort is normal.     Breath sounds: Wheezing present.  Abdominal:     General: There is no distension.     Palpations: Abdomen is soft.     Tenderness: There is no abdominal tenderness.  Neurological:     Mental Status: He is alert.  Psychiatric:        Mood and Affect: Mood normal.        Behavior: Behavior normal.    UC Treatments / Results  Labs (all labs ordered are listed, but only abnormal results are displayed) Labs Reviewed - No data to display  EKG   Radiology No results found.  Procedures Procedures (including critical care time)  Medications Ordered in UC Medications - No data to display  Initial Impression /  Assessment and Plan / UC Course  I have reviewed the triage vital signs and the nursing notes.  Pertinent labs & imaging results that were available during my care of the patient were reviewed by me and considered in my medical decision making (see chart for details).    79 year old male presents with COPD exacerbation.  He has ongoing shortness of breath and increased sputum production.  Has cough as well.  He has heart failure as well.  Given this, recommended antibiotic therapy is Augmentin.  Also placing on prednisone.  Advised compliance with his inhalers.  Supportive care.  Final Clinical Impressions(s) / UC Diagnoses   Final diagnoses:  COPD exacerbation [facility])     Discharge Instructions     Continue your inhalers.  Medications as prescribed.  Take care  Dr. Lacinda Axon    ED Prescriptions    Medication Sig Dispense Auth. Provider   predniSONE (DELTASONE) 50 MG tablet 1 tablet daily x 5 days 5 tablet Tahjay Binion G, DO   amoxicillin-clavulanate (AUGMENTIN) 875-125 MG tablet Take 1 tablet by mouth 2 (two) times daily for 7 days. 14 tablet Coral Spikes, DO     PDMP not reviewed this encounter.   Coral Spikes, [place] 09/16/20 1326

## 2020-09-16 NOTE — ED Triage Notes (Signed)
Pt c/o cough, runny and shortness of breath. Started about 4-5 days ago. Denies fever he states he was seen at another UC and tested fo covid and was negative. Pt has h/o COPD

## 2020-10-18 IMAGING — DX DG HIP (WITH OR WITHOUT PELVIS) 1V PORT*L*
2 series · 2 of 2 positions shown · non-contrast
Comparison: Portable exam 5512 hours without priors for comparison

CLINICAL DATA: Post LEFT total hip arthroplasty

EXAM:
DG HIP (WITH OR WITHOUT PELVIS) 1V PORT LEFT

[pelvis ap]
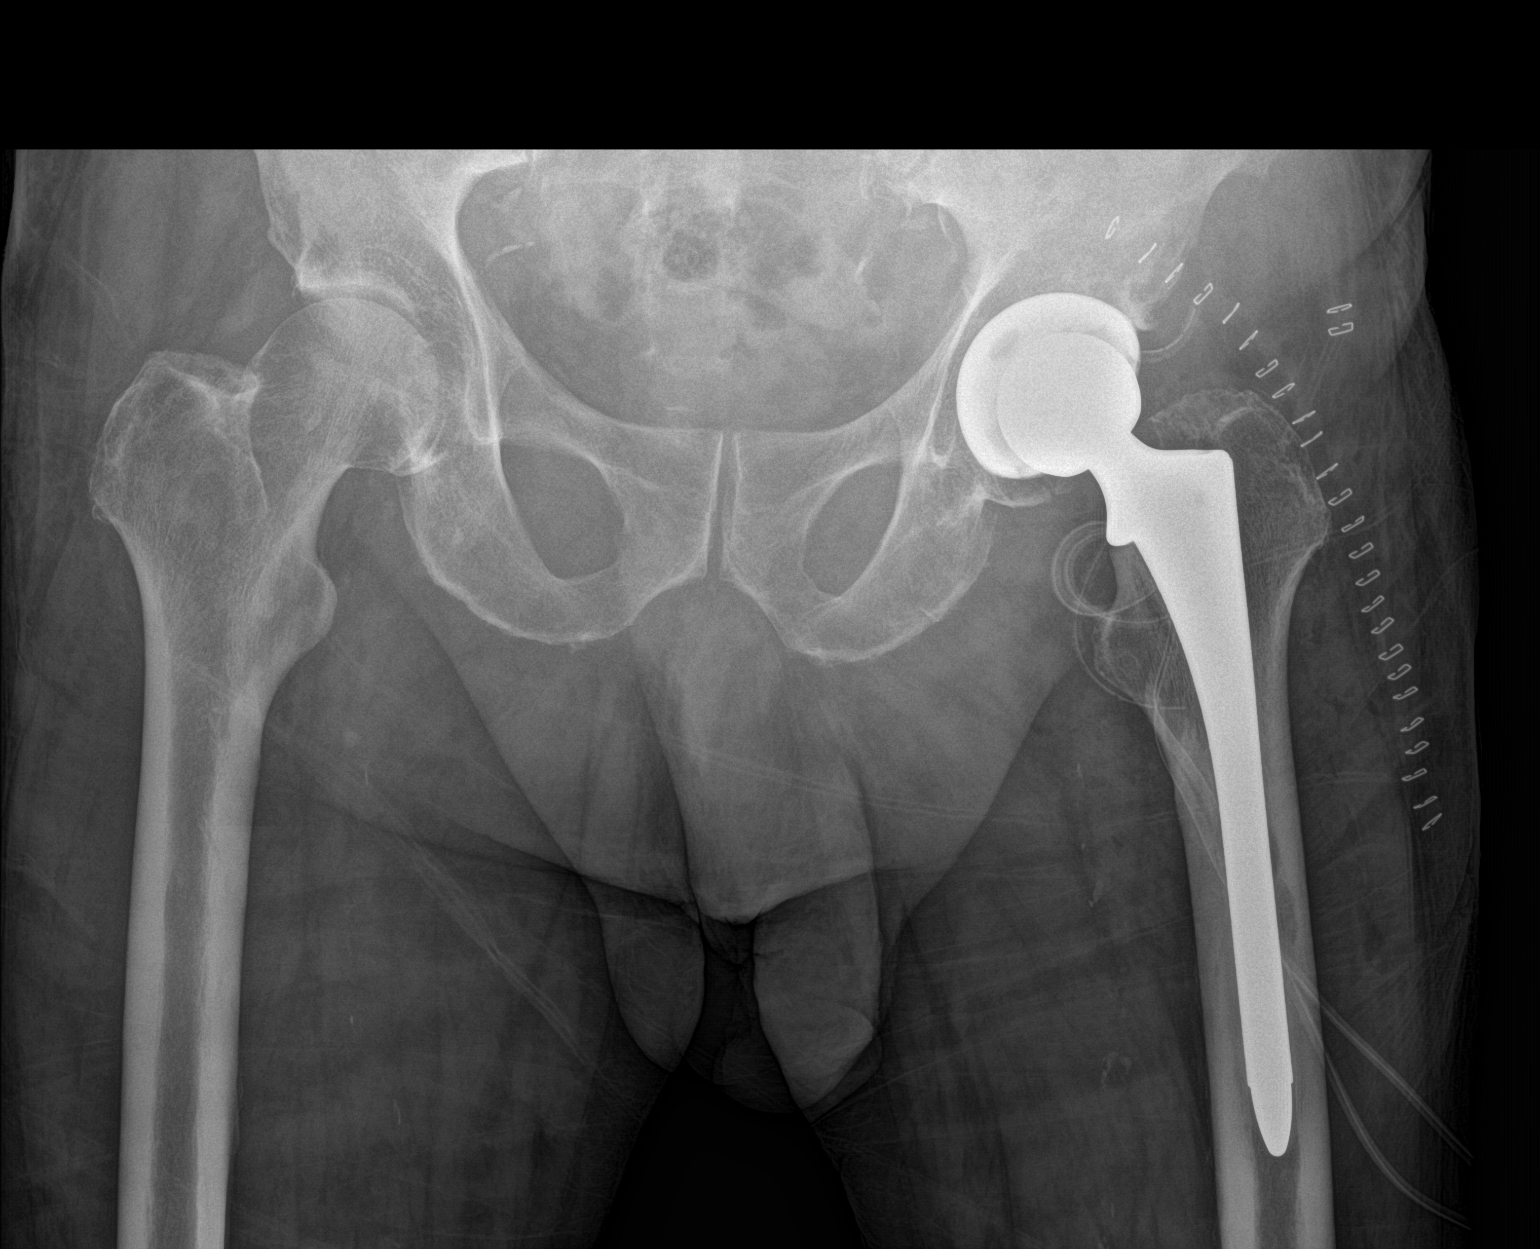

[hip lat]
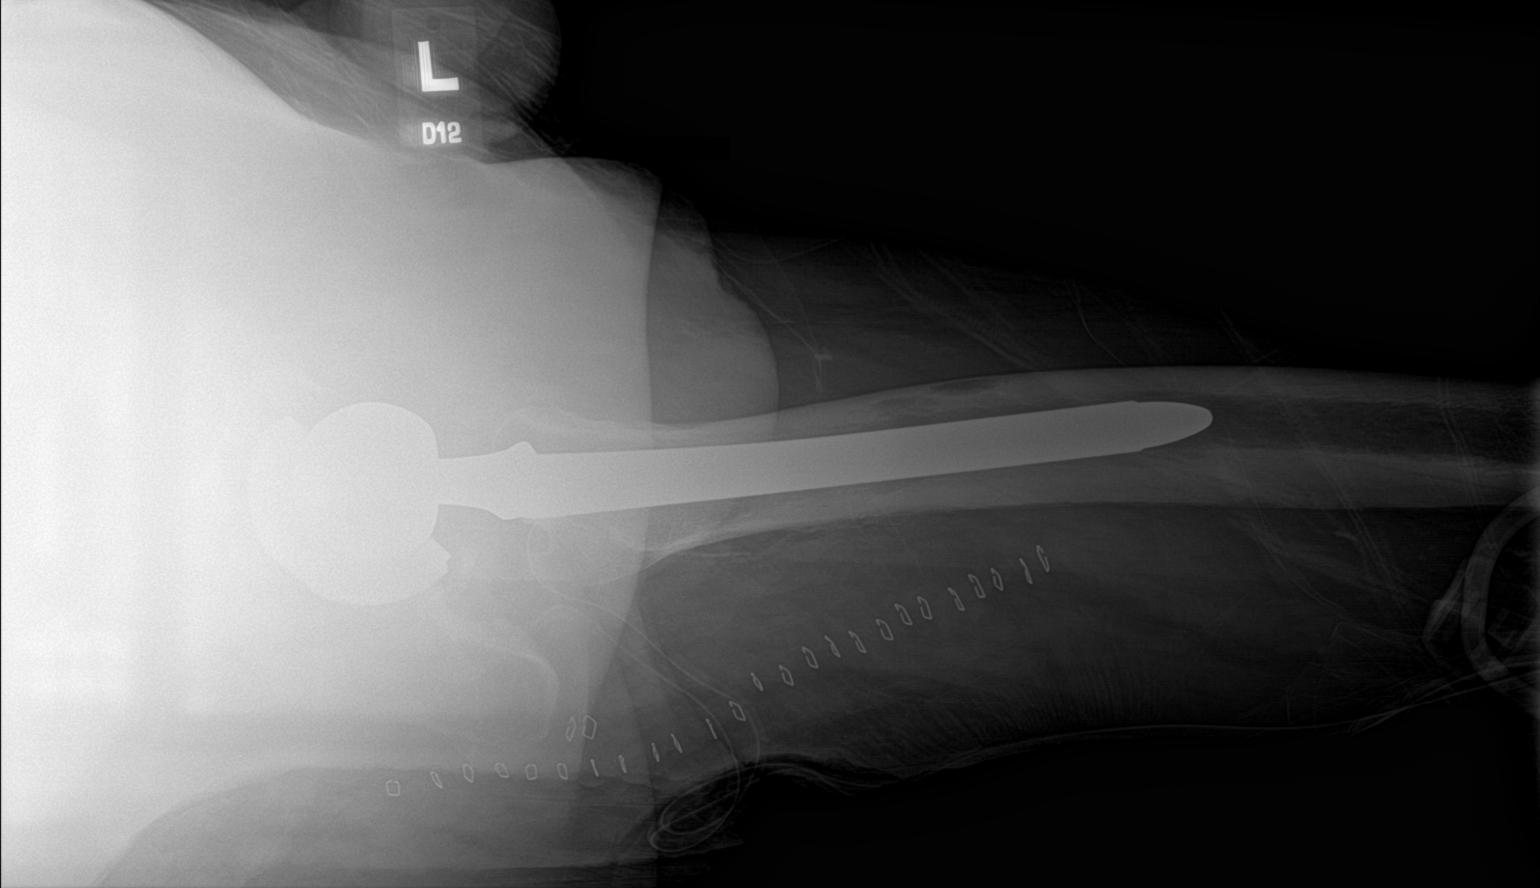

[2 of 2 positions shown; findings below may reference images not displayed]

FINDINGS: Osseous demineralization.

LEFT hip prosthesis identified in expected position.

Overlying surgical drains and skin clips.

No fracture or dislocation.

Small areas of cortical lucency identified at the proximal femoral
diaphysis on cross-table lateral view, with minimal endosteal
scalloping on AP view, doubt related to surgery, suspicious for
underlying bone lesion.
IMPRESSION: LEFT hip prosthesis without acute fracture or dislocation.

Endosteal scalloping and cortical lucency identified at proximal
LEFT femoral diaphysis, suspicious for underlying bone lesion;
recommendation correlation with preoperative images to assess
acuity.

## 2021-04-19 NOTE — Discharge Instructions (Addendum)
Instructions after Hand / Wrist Surgery   James P. Hooten, [name], M.D.  Dept. of Orthopaedics & Sports Medicine  [facility]  [address]  [place], [place]  [address]   Phone: [phone]   Fax: [phone]   DIET: Drink plenty of non-alcoholic fluids & begin a light diet. Resume your normal diet the day after surgery.  ACTIVITY:  Keep the hand elevated above the level of the elbow. Begin gently moving the fingers on a regular basis to avoid stiffness. Avoid any heavy lifting, pushing, or pulling with the operative hand. Do not drive or operate any equipment until instructed.  WOUND CARE:  Keep the splint/bandage clean and dry.  The splint and stitches will be removed in the office. Continue to use the ice packs periodically to reduce pain and swelling. You may bathe or shower after the stitches are removed at the first office visit following surgery.  MEDICATIONS: You may resume your regular medications. Please take the pain medication as prescribed. Do not take pain medication on an empty stomach. Do not drive or drink alcoholic beverages when taking pain medications.  CALL THE OFFICE FOR: Temperature above 101 degrees Excessive bleeding or drainage on the dressing. Excessive swelling, coldness, or paleness of the fingers. Persistent nausea and vomiting.  FOLLOW-UP:  You should have an appointment to return to the office in 7-10 days after surgery.   REMEMBER: R.I.C.E. = Rest, Ice, Compression, Elevation !      [facility] Department Directory         [email or web address]       [email or web address]          Cardiology  Appointments: [place] - [phone] [place] - [phone]  Endocrinology  Appointments: [place] - [phone] [place] - [phone]  Gastroenterology  Appointments: [place] - [phone] [place] - [phone]        General Surgery   Appointments: [place] -  [phone]  Internal Medicine/Family Medicine  Appointments: [place] - [phone] [place] - [phone] [place] - [phone]  Metabolic and Weigh Loss Surgery  Appointments: [place] [facility] - [phone]        Neurology  Appointments: [place] - [phone] [place] - [phone]  Neurosurgery  Appointments: [place] - [phone]  Obstetrics & Gynecology  Appointments: [place] - [phone] [place] - [phone]        Pediatrics  Appointments: [place] - [phone] [place] - [phone]  Physiatry  Appointments: [place] -[phone]  Physical Therapy  Appointments: [place] - [phone] [place] - [phone]        Podiatry  Appointments: [place] - [phone] [place] - [phone]  Pulmonology  Appointments: [place] - [phone]  Rheumatology  Appointments: [place] - [phone]        [place] Location: [facility]  [address] [place], [place]  [address]  [place] Location: [facility] [address] [place], [place]  [address]  [place] Location: [facility] [address] [place],   [address]            AMBULATORY SURGERY  DISCHARGE INSTRUCTIONS   The drugs that you were given will stay in your system until tomorrow so for the next 24 hours you should not:  Drive an automobile Make any legal decisions Drink any alcoholic beverage   You may resume regular meals tomorrow.  Today it is better to start with liquids and gradually work up to solid foods.  You may eat anything you prefer, but it is better to start with liquids, then soup and crackers, and gradually work up to solid foods.     Please notify your doctor immediately if you have any unusual bleeding, trouble breathing, redness and pain at the surgery site, drainage, fever, or pain not relieved by medication.    Additional Instructions:   Please contact your physician with any problems or Same Day Surgery at [phone],  Monday through Friday 6 am to 4 pm, or [place] at [place] Main number at [phone]. 

## 2021-04-19 NOTE — H&P (Signed)
ORTHOPAEDIC HISTORY & PHYSICAL Mathew Reyes, Mathew Marker., MD - 04/14/2021 10:30 AM EDT Formatting of this note is different from the original. Images from the original note were not included. Chief Complaint: Chief Complaint  Patient presents with   Annual Exam  Left total hip arthroplasty 04/14/20   Reason for Visit: The patient is a 80 y.o. male who presents today with his wife for reevaluation of his left hip. He is 1 year status post left total hip arthroplasty. He denies any significant left groin or thigh pain. He is not using any ambulatory aids. He has been extremely pleased with the results of his surgery.  The patient did request evaluation of his left hand and wrist. He has a relatively long history of left hand numbness and pain. He denies any specific trauma or aggravating event. He the paresthesias tend to be aggravated by certain positions such as holding a phone or while driving. He has used NSAIDs and a glove in the past without any significant improvement of his symptoms. He has also noted decrease in the grip strength of his left hand. He is right-hand dominant..  Medications: Current Outpatient Medications  Medication Sig Dispense Refill   ascorbic acid (VITAMIN C) 500 MG tablet Take 500 mg by mouth once daily.   aspirin 81 MG EC tablet TAKE 1 TABLET DAILY 30 tablet 11   calcium carbonate 600 mg calcium (1,500 mg) Tab tablet Take 600 mg by mouth once daily   carvediloL (COREG) 3.125 MG tablet Take 1 tablet (3.125 mg total) by mouth 2 (two) times daily 180 tablet 3   celecoxib (CELEBREX) 200 MG capsule Take 1 capsule (200 mg total) by mouth once daily 30 capsule 1   digoxin (LANOXIN) 0.125 MG tablet TAKE 1 TABLET DAILY 90 tablet 3   finasteride (PROSCAR) 5 mg tablet Take 1 tablet (5 mg total) by mouth once daily 90 tablet 1   FLUoxetine (PROZAC) 20 MG capsule Take 20 mg by mouth once daily.   levalbuterol (XOPENEX HFA) inhaler USE 2 INHALATIONS EVERY 4 HOURS 45 g 6    montelukast (SINGULAIR) 10 mg tablet TAKE 1 TABLET NIGHTLY 90 tablet 3   naproxen sodium (ALEVE) 220 MG tablet Take 440 mg by mouth 2 (two) times daily as needed   omega-3 fatty acids/fish oil 340-1,000 mg capsule Take 1 capsule by mouth 2 (two) times daily   omeprazole (PRILOSEC) 20 MG DR capsule TAKE 1 CAPSULE DAILY 90 capsule 3   sacubitriL-valsartan (ENTRESTO) 24-26 mg tablet Take 1 tablet by mouth every 12 (twelve) hours 90 tablet 7   sildenafiL (VIAGRA) 100 MG tablet Take 1 tablet (100 mg total) by mouth once daily As needed 30 tablet 3   SYMBICORT 160-4.5 mcg/actuation inhaler USE 2 INHALATIONS TWICE A DAY 30.6 g 3   tamsulosin (FLOMAX) 0.4 mg capsule TAKE 1 CAPSULE TWICE A DAY 30 MINUTES AFTER THE SAME MEALS EACH DAY 180 capsule 3   tiotropium (SPIRIVA) 18 mcg inhalation capsule Place 1 capsule (18 mcg total) into inhaler and inhale once daily 30 capsule 5   No current facility-administered medications for this visit.   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   BPH (benign prostatic hypertrophy)   Cardiomyopathy (CMS-HCC)   CHF (congestive heart failure) (CMS-HCC)   COPD (chronic obstructive pulmonary disease) (CMS-HCC)   DDD (degenerative disc disease),  lumbar 11/03/2015   Depression  mild   DOE (dyspnea on exertion)   GERD (gastroesophageal reflux disease)   Hyperlipidemia   Hypertension  controlled with diet and exercise   Psoriasis   Situational anxiety   SOB (shortness of breath)   Tobacco abuse   Past Surgical History: Past Surgical History:  Procedure Laterality Date   Left total hip arthroplasty 04/14/2020  Dr Marry Guan   APPENDECTOMY 1952   COLONOSCOPY 11/28/03; 04/11/08  Barrett's-AHMUS   COLONOSCOPY   COLONOSCOPY 08/15/2014 [facility]  Personal hx colonic polyps/ Repeat 5 years/ [facility]   EGD 11/28/03; 01/04/06; 01/11/07; 06/16/10   +Barrett's   EGD 08/15/2014 [facility]  Long Segment Barrett's/ Repeat w Dani Gobble 09/12/14) [facility]   EGD 09/12/2014  Repeat 65months for Baryx/to repeat 11/14/2014/[facility]   EGD w/ Barrx 01/23/2015  Changes consistent with Barrett's/Treated with radiofrequency ablation./Repeat 6months/[facility]   EGD w/ Barrx 03/20/2015  Barrett's/Repeat 2-3 months/[facility]   EGD w/ Barrx 08/28/2015  Changes consistent with Barrett's/Treated with radiofrequency ablation/Repeat 63months/[facility]   EGD with BARRX 11/27/2015  Barrett's treated with BARRX/Repeat 2 to 3 months with Barrx/[facility]   HEMORRHOIDECTOMY BY SIMPLE LIGATION   HERNIA REPAIR   POLYPECTOMY  Polyp removed from throat   TONSILLECTOMY  One tonsil removed   UPPER GASTROINTESTINAL ENDOSCOPY   Social History: Social History   Socioeconomic History   Marital status: Widowed  Spouse name: Mathew Reyes   Number of children: 2   Years of education: 12   Highest education level: High school graduate  Occupational History   Occupation: Part-time- Education officer, community  Tobacco Use   Smoking status: Current Every Day Smoker  Packs/day: 1.50  Years: 60.00  Pack years: 90.00  Types: Cigarettes   Smokeless tobacco: Never Used  Scientific laboratory technician Use: Never used  Substance and Sexual Activity   Alcohol use: Yes  Alcohol/week: 7.0 standard drinks  Types: 7 Standard drinks or equivalent per week  Comment: occasional   Drug use: No   Sexual activity: Yes  Partners: Female   Family History: Family History  Problem Relation Age of Onset   Alzheimer's disease Mother   Reyes Mother  "Nervous Breakdown"   No Known Problems Father   No Known Problems Sister   Reyes Maternal Aunt  Cancer   Parkinsonism Paternal Uncle   Review of Systems: A comprehensive 14 point ROS was performed, reviewed, and the pertinent orthopaedic findings are documented in the HPI.  Exam BP 132/84  Temp 36.3 C (97.3 F)  Ht 170.2 cm (5\' 7" )  Wt 80.5 kg (177 lb 6.4 oz)  BMI 27.78  kg/m   General:  Well-developed, well-nourished male seen in no acute distress.  He ambulates with an even heel-to-toe gait.  HEENT:  Atraumatic, normocephalic. Pupils are equal and reactive to light. Extraocular motion is intact. Sclera are clear. Oropharynx is clear with moist mucosa.  Neck: Good range of motion. No tenderness to palpation. Spurling`s test and Lhermitte sign are negative.  Lungs:  Clear to auscultation bilaterally.  Cardiovascular:  Regular rate and rhythm. Normal S1, S2. No murmur . No appreciable gallops or rubs. Peripheral pulses are palpable.   Extremities:  Normal shoulder contour.  Good range of motion and stability of the shoulders, elbows, and wrists. Tinel`s test at the elbow is negative.  Left hand:  Tenderness: Negative Erythema: negative Swelling: negative Capillary Refill: normal Thenar atrophy: positive Intrinsic wasting: negative Grip strength: fair to good grip strength Pincer strength: fair to good pincer strength  Tinel`s test: equivocal Phalen`s test: negative Triggering: No gross triggering or locking of the digits Finkelstein`s test: negative Range of motion: Good range of motion of the digits  Left Hip: Pelvic tilt: Negative Limb lengths: Equal with the patient standing Soft tissue swelling: Negative Erythema: Negative Crepitance: Negative Tenderness: Greater trochanter is nontender to palpation. No pain is elicited by axial compression or extremes of rotation. Atrophy: No atrophy. Good hip flexor and abductor strength. Range of Motion: Good active and passive range of motion of the left hip is appreciated.  Neurologic:  Awake, alert , and oriented.  Sensory function is intact except for decreased discrimination to pinprick and light touch in a median nerve distribution. Motor strength is judged to be 5/5 except as noted above. No clonus or tremor.  Motor coordination is within normal limits.  Radiographs: I ordered and  interpreted AP pelvis, AP, and lateral radiographs of the left hip that were obtained in the office today. Total hip implants are in good position. No evidence of loosening or wear. No significant heterotopic ossification. No evidence of fracture or dislocation.   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 He should return on a yearly basis for reevaluation of the left hip. The findings of the left hand were discussed in detail with the patient. The patient was given informational material on carpal tunnel release. Conservative treatment options were reviewed with the patient. We discussed the risks and benefits of surgical intervention. The usual perioperative course was also discussed in detail. We discussed the possibility that the symptoms may not completely resolve despite carpal tunnel release. The patient expressed understanding of the risks and benefits of surgical intervention and would like to proceed with plans for left carpal tunnel release.  I spent a total of 40 minutes in both face-to-face and non-face-to-face activities for this visit on the date of this encounter.  MEDICAL CLEARANCE: Per anesthesiology ACTIVITIES: As tolerated. WORK STATUS: Anticipate out of work for approximately 1 week THERAPY: None MEDICATIONS: Requested Prescriptions   No prescriptions requested or ordered in this encounter   FOLLOW-UP: Return for postoperative follow-up.  Saphira Lahmann P. Holley Bouche., M.D.  Electronically signed by Lamar Benes., MD at 04/14/2021 1:37 PM EDT

## 2021-04-28 ENCOUNTER — Encounter
Admission: RE | Admit: 2021-04-28 | Discharge: 2021-04-28 | Disposition: A | Discharge status: Home / Self Care | Payer: BLUE CROSS/BLUE SHIELD | Source: Ambulatory Visit | Attending: Orthopedic Surgery | Admitting: Orthopedic Surgery

## 2021-04-28 ENCOUNTER — Encounter: Payer: Self-pay | Admitting: Orthopedic Surgery

## 2021-04-28 ENCOUNTER — Other Ambulatory Visit: Payer: Self-pay

## 2021-04-28 NOTE — Patient Instructions (Addendum)
Your procedure is scheduled on: 05/04/21 Report to [facility]. To find out your arrival time please call [phone] between 1PM - 3PM on 05/01/21.  Remember: Instructions that are not followed completely may result in serious medical risk, up to and including death, or upon the discretion of your surgeon and anesthesiologist your surgery may need to be rescheduled.     _X__ 1. Do not eat food after midnight the night before your procedure.                 No gum chewing or hard candies. You may drink clear liquids up to 2 hours                 before you are scheduled to arrive for your surgery- DO not drink clear                 liquids within 2 hours of the start of your surgery.                 Clear Liquids include:  water, apple juice without pulp, clear carbohydrate                 drink such as Clearfast or Gatorade, Black Coffee or Tea (Do not add                 anything to coffee or tea). Diabetics water only  __X__2.  On the morning of surgery brush your teeth with toothpaste and water, you                 may rinse your mouth with mouthwash if you wish.  Do not swallow any              toothpaste of mouthwash.     _X__ 3.  No Alcohol for 24 hours before or after surgery.   _X__ 4.  Do Not Smoke or use e-cigarettes For 24 Hours Prior to Your Surgery.                 Do not use any chewable tobacco products for at least 6 hours prior to                 surgery.  ____  5.  Bring all medications with you on the day of surgery if instructed.   __X__  6.  Notify your doctor if there is any change in your medical condition      (cold, fever, infections).     Do not wear jewelry, make-up, hairpins, clips or nail polish. Do not wear lotions, powders, or perfumes.  Do not shave 48 hours prior to surgery. Men may shave face and neck. Do not bring valuables to the hospital.    [facility] is not responsible for any belongings or  valuables.  Contacts, dentures/partials or body piercings may not be worn into surgery. Bring a case for your contacts, glasses or hearing aids, a denture cup will be supplied. Leave your suitcase in the car. After surgery it may be brought to your room. For patients admitted to the hospital, discharge time is determined by your treatment team.   Patients discharged the day of surgery will not be allowed to drive home.   Please read over the following fact sheets that you were given:     __X__ Take these medicines the morning of surgery with A SIP OF WATER:  1. carvedilol (COREG) 3.125 MG tablet  2. digoxin (LANOXIN) 0.125 MG tablet  3. finasteride (PROSCAR) 5 MG tablet  4. FLUoxetine (PROZAC) 20 MG capsule  5. omeprazole (PRILOSEC) 20 MG capsule  6. tamsulosin (FLOMAX) 0.4 MG CAPS capsule  7.   ____ Fleet Enema (as directed)   ____ Use CHG Soap/SAGE wipes as directed  __X__ Use inhalers on the day of surgery  ____ Stop metformin/Janumet/Farxiga 2 days prior to surgery    ____ Take 1/2 of usual insulin dose the night before surgery. No insulin the morning          of surgery.   ____ Stop Blood Thinners Coumadin/Plavix/Xarelto/Pleta/Pradaxa/Eliquis/Effient/Aspirin  on   Or contact your Surgeon, Cardiologist or Medical Doctor regarding  ability to stop your blood thinners  __X__ Stop Anti-inflammatories 7 days before surgery such as Advil, Ibuprofen, Motrin,  BC or Goodies Powder, Naprosyn, Naproxen, Aleve,    __X__ Stop all herbal supplements, fish oil or vitamin E until after surgery.    ____ Bring C-Pap to the hospital.

## 2021-04-28 NOTE — Progress Notes (Signed)
Perioperative Services  Pre-Admission/Anesthesia Testing Clinical Review  Date: 04/30/21  Patient Demographics:  Name: Mathew Reyes DOB:   [birth year] MRN:   [id number]  Planned Surgical Procedure(s):    Case: [id number] Date/Time: 05/04/21 1534   Procedure: CARPAL TUNNEL RELEASE (Left: Wrist)   Anesthesia type: Choice   Pre-op diagnosis: CARPAL TUNNEL SYNDROME, LEFT   Location: [facility] OR ROOM 01 / [place] [facility]   Surgeons: Dereck Leep, MD     NOTE: Available PAT nursing documentation and vital signs have been reviewed. Clinical nursing staff has updated patient's PMH/PSHx, current medication list, and drug allergies/intolerances to ensure comprehensive history available to assist in medical decision making as it pertains to the aforementioned surgical procedure and anticipated anesthetic course. Extensive review of available clinical information performed. [place] [facility] and PSHx updated with any diagnoses/procedures that  may have been inadvertently omitted during his intake with the pre-admission testing department's nursing staff.  Clinical Discussion:  Mathew Reyes is a 80 y.o. male who is submitted for pre-surgical anesthesia review and clearance prior to him undergoing the above procedure. Patient is a Current Smoker (~60 pack years; smoking varying amounts since age 41). 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  Patient is followed by cardiology Clayborn Bigness, MD). He was last seen in the cardiology clinic on 03/02/2021; notes reviewed.  At the time of his clinic visit, patient doing well overall from a cardiovascular perspective.  He denied any chest pain, however patient had chronic exertional dyspnea related to his underlying COPD and HFrEF diagnoses.  PMH significant for cardiovascular disease.  Myocardial perfusion  imaging study performed in 10/2015 revealed no evidence of significant stress-induced myocardial ischemia or arrhythmia.  Nuclear stress LVEF was 45%.  Study was deemed to be low and low risk.  Last TTE performed on 02/14/2020 revealed mild segmental left ventricular systolic dysfunction with an estimated EF of 40%.  There was biatrial enlargement, moderate LVH, RV enlargement, and mild valvular insufficiency.  Patient on GDMT for his HTN diagnosis.  Blood pressure well controlled at 130/76 on currently prescribed beta-blocker and ARB therapies.  HFrEF symptoms managed on digoxin and Entresto.  Patient is on an omega-3 fatty acid capsule for his HLD.  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  Patient with a prediabetes diagnosis; last Hgb A1c 5.9% when last checked on 01/27/2021. Functional capacity, as defined by DASI, is documented as being >/= 4 METS.  No changes were made to patient's medication regimen.  Patient to follow-up with outpatient cardiology in 6 months or sooner if needed.  Patient is scheduled for an elective carpal tunnel repair on 05/04/2021 with Dr. Skip Estimable.  Given patient's past medical history significant for cardiovascular diagnoses, presurgical cardiac clearance was sought by the PAT team. Per cardiology, "this patient is optimized for surgery and may proceed with the planned procedural course with a LOW risk stratification". This patient is on daily antiplatelet therapy. He has been instructed on recommendations for holding his daily low-dose ASA for 5 days prior to his procedure with plans to restart 2 days postoperatively, or as soon as postoperative bleeding risk felt to be minimized by his attending surgeon. The patient has been instructed that his last dose of his anticoagulant will be on 04/28/2021.  Patient denies previous perioperative complications with anesthesia in the past. In review of the available records, it is  noted that patient  underwent a neuraxial anesthetic course here (ASA III) in 04/2020 without documented complications.   Vitals with BMI 04/28/2021 09/16/2020 04/16/2020  Height _0  _1  -  Weight 177 lbs 173 lbs 1 oz -  BMI 69.67 89.3 -  Systolic - 810 175  Diastolic - 91 79  Pulse - 77 88    Providers/Specialists:   NOTE: Primary physician provider listed below. Patient may have been seen by APP or partner within same practice.   PROVIDER ROLE / SPECIALTY LAST OV  Hooten, Laurice Record, MD  Orthopedics (Surgeon) 04/14/2021  Idelle Crouch, MD  Primary Care Provider 02/03/2021  Katrine Coho, MD  Cardiology 03/02/2021   Allergies:  Patient has no known allergies.  Current Home Medications:   No current facility-administered medications for this encounter.    aspirin EC 81 MG tablet   budesonide-formoterol (SYMBICORT) 160-4.5 MCG/ACT inhaler   calcipotriene-betamethasone (TACLONEX) ointment   calcium carbonate (OSCAL) 1500 (600 Ca) MG TABS tablet   carvedilol (COREG) 3.125 MG tablet   celecoxib (CELEBREX) 200 MG capsule   digoxin (LANOXIN) 0.125 MG tablet   ENTRESTO 24-26 MG   finasteride (PROSCAR) 5 MG tablet   FLUoxetine (PROZAC) 20 MG capsule   levalbuterol (XOPENEX HFA) 45 MCG/ACT inhaler   montelukast (SINGULAIR) 10 MG tablet   Multiple Vitamin (MULTIVITAMIN WITH MINERALS) TABS tablet   Omega-3 Fatty Acids (FISH OIL) 1000 MG CAPS   omeprazole (PRILOSEC) 20 MG capsule   tamsulosin (FLOMAX) 0.4 MG CAPS capsule   tiotropium (SPIRIVA) 18 MCG inhalation capsule   vitamin C (ASCORBIC ACID) 500 MG tablet   Na Sulfate-K Sulfate-Mg Sulf 17.5-3.13-1.6 GM/177ML SOLN   sildenafil (VIAGRA) 100 MG tablet   History:   Past Medical History:  Diagnosis Date   Abdominal hernia    Anxiety    Arthritis    Barrett's esophagus    with h/o (+) low grade dysplasia   BPH (benign prostatic hyperplasia)    COPD (chronic obstructive pulmonary disease) (HCC)    DCM (dilated cardiomyopathy) (HCC)     DDD (degenerative disc disease), lumbar    Depression    DOE (dyspnea on exertion)    Erectile dysfunction    GERD (gastroesophageal reflux disease)    HFrEF (heart failure with reduced ejection fraction) (HCC)    HLD (hyperlipidemia)    Hypertension    Panlobular emphysema (HCC)    Prediabetes    Psoriasis    Situational anxiety    SOB (shortness of breath)    Tobacco abuse    Past Surgical History:  Procedure Laterality Date   APPENDECTOMY     COLONOSCOPY, ESOPHAGOGASTRODUODENOSCOPY (EGD) AND ESOPHAGEAL DILATION     ESOPHAGOGASTRODUODENOSCOPY N/A 03/20/2015   Procedure: ESOPHAGOGASTRODUODENOSCOPY (EGD);  Surgeon: Hulen Luster, MD;  Location: [facility] ENDOSCOPY;  Service: Gastroenterology;  Laterality: N/A;   ESOPHAGOGASTRODUODENOSCOPY N/A 08/28/2015   Procedure: ESOPHAGOGASTRODUODENOSCOPY (EGD);  Surgeon: Hulen Luster, MD;  Location: [facility] ENDOSCOPY;  Service: Gastroenterology;  Laterality: N/A;   ESOPHAGOGASTRODUODENOSCOPY N/A 11/27/2015   Procedure: ESOPHAGOGASTRODUODENOSCOPY (EGD);  Surgeon: Hulen Luster, MD;  Location: [facility] ENDOSCOPY;  Service: Gastroenterology;  Laterality: N/A;   Hemmorrhoidectomy     HERNIA REPAIR     JOINT REPLACEMENT     Throid polyp     TOTAL HIP ARTHROPLASTY Left 04/14/2020   Procedure: TOTAL HIP ARTHROPLASTY;  Surgeon: Dereck Leep, MD;  Location: [facility] ORS;  Service: Orthopedics;  Laterality: Left;   Family History  Problem Relation Age of  Onset   Alzheimer's disease Mother    Aortic aneurysm Father    Kidney disease Neg Hx    Prostate cancer Neg Hx    Social History   Tobacco Use   Smoking status: Every Day    Packs/day: 1.00    Years: 60.00    Pack years: 60.00    Types: Cigarettes   Smokeless tobacco: Never   Tobacco comments:    pt has been smoking since he was 80 years old!!  Vaping Use   Vaping Use: Never used  Substance Use Topics   Alcohol use: Yes    Alcohol/week: 0.0 standard drinks    Comment: socially   Drug use: No     Pertinent Clinical Results:  LABS: Labs reviewed: Acceptable for surgery.   Ref Range & Units 3 mo ago  Glucose 70 - 110 mg/dL 96   Sodium 136 - 145 mmol/L 140   Potassium 3.6 - 5.1 mmol/L 4.6   Chloride 97 - 109 mmol/L 103   Carbon Dioxide (CO2) 22.0 - 32.0 mmol/L 32.3 High    Urea Nitrogen (BUN) 7 - 25 mg/dL 15   Creatinine 0.7 - 1.3 mg/dL 1.1   Glomerular Filtration Rate (eGFR), MDRD Estimate >60 mL/min/1.73sq m 65   Calcium 8.7 - 10.3 mg/dL 9.2   AST  8 - 39 U/L 16   ALT  6 - 57 U/L 12   Alk Phos (alkaline Phosphatase) 34 - 104 U/L 55   Albumin 3.5 - 4.8 g/dL 4.0   Bilirubin, Total 0.3 - 1.2 mg/dL 0.7   Protein, Total 6.1 - 7.9 g/dL 6.6   A/G Ratio 1.0 - 5.0 gm/dL 1.5   Resulting Agency  [place] - LAB  Specimen Collected: 01/27/21 08:47 Last Resulted: 01/27/21 13:56  Received From: [place]  Result Received: 04/19/21 11:22    Ref Range & Units 3 mo ago  WBC (White Blood Cell Count) 4.1 - 10.2 10^3/uL 8.9   RBC (Red Blood Cell Count) 4.69 - 6.13 10^6/uL 4.62 Low    Hemoglobin 14.1 - 18.1 gm/dL 14.2   Hematocrit 40.0 - 52.0 % 43.4   MCV (Mean Corpuscular Volume) 80.0 - 100.0 fl 93.9   MCH (Mean Corpuscular Hemoglobin) 27.0 - 31.2 pg 30.7   MCHC (Mean Corpuscular Hemoglobin Concentration) 32.0 - 36.0 gm/dL 32.7   Platelet Count 150 - 450 10^3/uL 195   RDW-CV (Red Cell Distribution Width) 11.6 - 14.8 % 13.3   MPV (Mean Platelet Volume) 9.4 - 12.4 fl 9.7   Neutrophils 1.50 - 7.80 10^3/uL 6.07   Lymphocytes 1.00 - 3.60 10^3/uL 1.63   Monocytes 0.00 - 1.50 10^3/uL 0.77   Eosinophils 0.00 - 0.55 10^3/uL 0.32   Basophils 0.00 - 0.09 10^3/uL 0.04   Neutrophil % 32.0 - 70.0 % 68.6   Lymphocyte % 10.0 - 50.0 % 18.4   Monocyte % 4.0 - 13.0 % 8.7   Eosinophil % 1.0 - 5.0 % 3.6   Basophil% 0.0 - 2.0 % 0.5   Immature Granulocyte % <=0.7 % 0.2   Immature Granulocyte Count <=0.06 10^3/L 0.02   Resulting Agency  [place] - [facility]   Specimen Collected: 01/27/21 08:47 Last Resulted: 01/27/21 09:02  Received From: [place]  Result Received: 04/19/21 11:22    Ref Range & Units 3 mo ago  Hemoglobin A1C 4.2 - 5.6 % 5.9 High    Average Blood Glucose (Calc) mg/dL 123   Resulting Agency  [place]   Specimen Collected: 01/27/21 08:47 Last Resulted: 01/27/21 10:00  Received From: [place]  Result Received: 04/19/21 11:22    ECG: Date: 03/02/2021 Rate: 64 bpm Rhythm:  Sinus rhythm with first-degree AV block Axis (leads I and aVF): Left axis deviation Intervals: PR 218 ms. QRS 86 ms. QTc 396 ms. ST segment and T wave changes: No evidence of acute ST segment elevation or depression; evidence of age undetermined anterior and inferior infarcts noted Comparison: Similar to previous tracing obtained on 10/24/2015. NOTE: Tracing obtained at [facility]; unable for review. Above based on cardiologist's interpretation.   IMAGING / PROCEDURES: TRANSTHORACIC ECHOCARDIOGRAM performed on 02/14/2020 Mild segmental LV systolic dysfunction with an estimated EF of 40% Normal right ventricular systolic function Mild MR and TR Trivial AR and PR No valvular stenosis Mild right ventricular enlargement Mild biatrial enlargement Moderate LVH No evidence of a pericardial effusion  LEXISCAN performed on 10/23/2015 No evidence of stress-induced myocardial ischemia or arrhythmia Nuclear stress EF = 45% This is a normal low risk study  Impression and Plan:  Mathew Reyes has been referred for pre-anesthesia review and clearance prior to him undergoing the planned anesthetic and procedural courses. Available labs, pertinent testing, and imaging results were personally reviewed by me. This patient has been appropriately cleared by cardiology with an overall LOW risk of significant perioperative cardiovascular complications.  Based on clinical review performed today  (04/30/21), barring any significant acute changes in the patient's overall condition, it is anticipated that he will be able to proceed with the planned surgical intervention. Any acute changes in clinical condition may necessitate his procedure being postponed and/or cancelled. Patient will meet with anesthesia team (MD and/or CRNA) on the day of his procedure for preoperative evaluation/assessment. Questions regarding anesthetic course will be fielded at that time.   Pre-surgical instructions were reviewed with the patient during his PAT appointment and questions were fielded by PAT clinical staff. Patient was advised that if any questions or concerns arise prior to his procedure then he should return a call to PAT and/or his surgeon's office to discuss.  Honor Loh, MSN, APRN, FNP-C, CEN [facility]  Peri-operative Services Nurse Practitioner Phone: [phone] Fax: [phone] 04/30/21 8:30 AM  NOTE: This note has been prepared using Dragon dictation software. Despite my best ability to proofread, there is always the potential that unintentional transcriptional errors may still occur from this process.

## 2021-04-29 ENCOUNTER — Encounter: Payer: Self-pay | Admitting: Orthopedic Surgery

## 2021-05-04 ENCOUNTER — Encounter: Payer: Self-pay | Admitting: Orthopedic Surgery

## 2021-05-04 ENCOUNTER — Ambulatory Visit
Admission: RE | Admit: 2021-05-04 | Discharge: 2021-05-04 | Disposition: A | Discharge status: Home / Self Care | Payer: Medicare Other | Attending: Orthopedic Surgery | Admitting: Orthopedic Surgery

## 2021-05-04 ENCOUNTER — Ambulatory Visit: Payer: Medicare Other | Admitting: Urgent Care

## 2021-05-04 ENCOUNTER — Other Ambulatory Visit: Payer: Self-pay

## 2021-05-04 ENCOUNTER — Encounter
Admission: RE | Disposition: A | Discharge status: Home / Self Care | Payer: Self-pay | Source: Home / Self Care | Attending: Orthopedic Surgery

## 2021-05-04 DIAGNOSIS — Z791 Long term (current) use of non-steroidal anti-inflammatories (NSAID): Secondary | ICD-10-CM | POA: Insufficient documentation

## 2021-05-04 DIAGNOSIS — I420 Dilated cardiomyopathy: Secondary | ICD-10-CM | POA: Insufficient documentation

## 2021-05-04 DIAGNOSIS — Z79899 Other long term (current) drug therapy: Secondary | ICD-10-CM | POA: Insufficient documentation

## 2021-05-04 DIAGNOSIS — I5022 Chronic systolic (congestive) heart failure: Secondary | ICD-10-CM | POA: Insufficient documentation

## 2021-05-04 DIAGNOSIS — Z9889 Other specified postprocedural states: Secondary | ICD-10-CM

## 2021-05-04 DIAGNOSIS — Z7951 Long term (current) use of inhaled steroids: Secondary | ICD-10-CM | POA: Insufficient documentation

## 2021-05-04 DIAGNOSIS — F17210 Nicotine dependence, cigarettes, uncomplicated: Secondary | ICD-10-CM | POA: Diagnosis not present

## 2021-05-04 DIAGNOSIS — Z7982 Long term (current) use of aspirin: Secondary | ICD-10-CM | POA: Insufficient documentation

## 2021-05-04 DIAGNOSIS — G5602 Carpal tunnel syndrome, left upper limb: Secondary | ICD-10-CM | POA: Insufficient documentation

## 2021-05-04 DIAGNOSIS — I110 Hypertensive heart disease with heart failure: Secondary | ICD-10-CM | POA: Insufficient documentation

## 2021-05-04 DIAGNOSIS — J431 Panlobular emphysema: Secondary | ICD-10-CM | POA: Insufficient documentation

## 2021-05-04 HISTORY — DX: Unspecified systolic (congestive) heart failure: I50.20

## 2021-05-04 HISTORY — DX: Male erectile dysfunction, unspecified: N52.9

## 2021-05-04 HISTORY — DX: Dilated cardiomyopathy: I42.0

## 2021-05-04 HISTORY — DX: Barrett's esophagus without dysplasia: K22.70

## 2021-05-04 HISTORY — DX: Prediabetes: R73.03

## 2021-05-04 HISTORY — PX: CARPAL TUNNEL RELEASE: SHX101

## 2021-05-04 HISTORY — DX: Other intervertebral disc degeneration, lumbar region without mention of lumbar back pain or lower extremity pain: M51.369

## 2021-05-04 HISTORY — DX: Panlobular emphysema: J43.1

## 2021-05-04 HISTORY — DX: Hyperlipidemia, unspecified: E78.5

## 2021-05-04 HISTORY — DX: Other intervertebral disc degeneration, lumbar region: M51.36

## 2021-05-04 SURGERY — CARPAL TUNNEL RELEASE
Anesthesia: General | Site: Wrist | Laterality: Left

## 2021-05-04 MED ORDER — FENTANYL CITRATE (PF) 100 MCG/2ML IJ SOLN
INTRAMUSCULAR | Status: AC
  Filled 2021-05-04: qty 2

## 2021-05-04 MED ORDER — DEXMEDETOMIDINE (PRECEDEX) IN NS 20 MCG/5ML (4 MCG/ML) IV SYRINGE
PREFILLED_SYRINGE | INTRAVENOUS | Status: DC | PRN
  Administered 2021-05-04: 10 ug via INTRAVENOUS

## 2021-05-04 MED ORDER — 0.9 % SODIUM CHLORIDE (POUR BTL) OPTIME
TOPICAL | Status: DC | PRN
  Administered 2021-05-04: 60 mL

## 2021-05-04 MED ORDER — ACETAMINOPHEN 10 MG/ML IV SOLN
INTRAVENOUS | Status: AC
  Filled 2021-05-04: qty 100

## 2021-05-04 MED ORDER — DEXMEDETOMIDINE (PRECEDEX) IN NS 20 MCG/5ML (4 MCG/ML) IV SYRINGE
PREFILLED_SYRINGE | INTRAVENOUS | Status: AC
  Filled 2021-05-04: qty 5

## 2021-05-04 MED ORDER — NEOMYCIN-POLYMYXIN B GU 40-200000 IR SOLN
Status: DC | PRN
  Administered 2021-05-04: 2 mL

## 2021-05-04 MED ORDER — MIDAZOLAM HCL 2 MG/2ML IJ SOLN
INTRAMUSCULAR | Status: DC | PRN
  Administered 2021-05-04: 2 mg via INTRAVENOUS

## 2021-05-04 MED ORDER — IPRATROPIUM-ALBUTEROL 0.5-2.5 (3) MG/3ML IN SOLN
RESPIRATORY_TRACT | Status: AC
  Administered 2021-05-04: 3 mL
  Filled 2021-05-04: qty 3

## 2021-05-04 MED ORDER — ONDANSETRON HCL 4 MG/2ML IJ SOLN
INTRAMUSCULAR | Status: AC
  Filled 2021-05-04: qty 2

## 2021-05-04 MED ORDER — PROPOFOL 10 MG/ML IV BOLUS
INTRAVENOUS | Status: DC | PRN
  Administered 2021-05-04: 130 mg via INTRAVENOUS
  Administered 2021-05-04: 20 mg via INTRAVENOUS

## 2021-05-04 MED ORDER — CELECOXIB 200 MG PO CAPS
ORAL_CAPSULE | ORAL | Status: AC
  Administered 2021-05-04: 400 mg via ORAL
  Filled 2021-05-04: qty 2

## 2021-05-04 MED ORDER — ONDANSETRON HCL 4 MG/2ML IJ SOLN
INTRAMUSCULAR | Status: DC | PRN
  Administered 2021-05-04: 4 mg via INTRAVENOUS

## 2021-05-04 MED ORDER — EPHEDRINE 5 MG/ML INJ
INTRAVENOUS | Status: AC
  Filled 2021-05-04: qty 10

## 2021-05-04 MED ORDER — ORAL CARE MOUTH RINSE: 15.0000 mL | Freq: Once | OROMUCOSAL | Status: AC

## 2021-05-04 MED ORDER — CELECOXIB 200 MG PO CAPS: 400.0000 mg | ORAL_CAPSULE | Freq: Once | ORAL | Status: AC

## 2021-05-04 MED ORDER — BUPIVACAINE HCL (PF) 0.25 % IJ SOLN
INTRAMUSCULAR | Status: DC | PRN
  Administered 2021-05-04: 10 mL

## 2021-05-04 MED ORDER — LIDOCAINE HCL (CARDIAC) PF 100 MG/5ML IV SOSY
PREFILLED_SYRINGE | INTRAVENOUS | Status: DC | PRN
  Administered 2021-05-04: 100 mg via INTRAVENOUS

## 2021-05-04 MED ORDER — FENTANYL CITRATE (PF) 100 MCG/2ML IJ SOLN
INTRAMUSCULAR | Status: DC | PRN
  Administered 2021-05-04 (×4): 25 ug via INTRAVENOUS

## 2021-05-04 MED ORDER — KETAMINE HCL 50 MG/ML IJ SOLN
INTRAMUSCULAR | Status: DC | PRN
  Administered 2021-05-04: 50 mg via INTRAMUSCULAR

## 2021-05-04 MED ORDER — HYDROCODONE-ACETAMINOPHEN 5-325 MG PO TABS: 1.0000 | ORAL_TABLET | ORAL | 0 refills | Status: DC | PRN

## 2021-05-04 MED ORDER — SODIUM CHLORIDE 0.9 % IV SOLN
INTRAVENOUS | Status: DC | PRN
  Administered 2021-05-04: 20 ug/min via INTRAVENOUS

## 2021-05-04 MED ORDER — LACTATED RINGERS IV SOLN: INTRAVENOUS | Status: DC

## 2021-05-04 MED ORDER — MIDAZOLAM HCL 2 MG/2ML IJ SOLN
INTRAMUSCULAR | Status: AC
  Filled 2021-05-04: qty 2

## 2021-05-04 MED ORDER — ACETAMINOPHEN 10 MG/ML IV SOLN
INTRAVENOUS | Status: DC | PRN
  Administered 2021-05-04: 1000 mg via INTRAVENOUS

## 2021-05-04 MED ORDER — CHLORHEXIDINE GLUCONATE 0.12 % MT SOLN: 15.0000 mL | Freq: Once | OROMUCOSAL | Status: AC

## 2021-05-04 MED ORDER — EPHEDRINE SULFATE 50 MG/ML IJ SOLN
INTRAMUSCULAR | Status: DC | PRN
  Administered 2021-05-04: 10 mg via INTRAVENOUS
  Administered 2021-05-04: 5 mg via INTRAVENOUS
  Administered 2021-05-04: 10 mg via INTRAVENOUS

## 2021-05-04 MED ORDER — CHLORHEXIDINE GLUCONATE 0.12 % MT SOLN
OROMUCOSAL | Status: AC
  Administered 2021-05-04: 15 mL via OROMUCOSAL
  Filled 2021-05-04: qty 15

## 2021-05-04 MED ORDER — DEXAMETHASONE SODIUM PHOSPHATE 10 MG/ML IJ SOLN
INTRAMUSCULAR | Status: DC | PRN
  Administered 2021-05-04: 10 mg via INTRAVENOUS

## 2021-05-04 MED ORDER — DEXAMETHASONE SODIUM PHOSPHATE 10 MG/ML IJ SOLN
INTRAMUSCULAR | Status: AC
  Filled 2021-05-04: qty 1

## 2021-05-04 SURGICAL SUPPLY — 30 items
BNDG CMPR STD VLCR NS LF 5.8X3 (GAUZE/BANDAGES/DRESSINGS) ×1
BNDG ELASTIC 3X5.8 VLCR NS LF (GAUZE/BANDAGES/DRESSINGS) ×2 IMPLANT
BNDG ESMARK 4X12 TAN STRL LF (GAUZE/BANDAGES/DRESSINGS) ×2 IMPLANT
CANISTER SUCT 1200ML W/VALVE (MISCELLANEOUS) ×1 IMPLANT
CAST PADDING 3X4FT ST 30246 (SOFTGOODS) ×1
COVER WAND RF STERILE (DRAPES) ×2 IMPLANT
CUFF TOURN SGL QUICK 18X4 (TOURNIQUET CUFF) ×2 IMPLANT
DRSG DERMACEA 8X12 NADH (GAUZE/BANDAGES/DRESSINGS) ×2 IMPLANT
DURAPREP 26ML APPLICATOR (WOUND CARE) ×2 IMPLANT
ELECT CAUTERY BLADE 6.4 (BLADE) ×2 IMPLANT
ELECT REM PT RETURN 9FT ADLT (ELECTROSURGICAL) ×2
ELECTRODE REM PT RTRN 9FT ADLT (ELECTROSURGICAL) ×1 IMPLANT
GAUZE 4X4 16PLY ~~LOC~~+RFID DBL (SPONGE) ×2 IMPLANT
GAUZE SPONGE 4X4 12PLY STRL (GAUZE/BANDAGES/DRESSINGS) ×2 IMPLANT
GLOVE SURG ENC TEXT LTX SZ7.5 (GLOVE) ×2 IMPLANT
GLOVE SURG UNDER LTX SZ8 (GLOVE) ×2 IMPLANT
GOWN STRL REUS W/ TWL LRG LVL3 (GOWN DISPOSABLE) ×2 IMPLANT
GOWN STRL REUS W/TWL LRG LVL3 (GOWN DISPOSABLE) ×4
KIT TURNOVER KIT A (KITS) ×2 IMPLANT
MANIFOLD NEPTUNE II (INSTRUMENTS) ×2 IMPLANT
NS IRRIG 500ML POUR BTL (IV SOLUTION) ×2 IMPLANT
PACK EXTREMITY ARMC (MISCELLANEOUS) ×2 IMPLANT
PAD CAST CTTN 3X4 STRL (SOFTGOODS) ×1 IMPLANT
PADDING CAST COTTON 3X4 STRL (SOFTGOODS) ×1
SOL PREP PVP 2OZ (MISCELLANEOUS) ×2
SOLUTION PREP PVP 2OZ (MISCELLANEOUS) ×1 IMPLANT
SPLINT CAST 1 STEP 3X12 (MISCELLANEOUS) ×2 IMPLANT
STOCKINETTE 48X4 2 PLY STRL (GAUZE/BANDAGES/DRESSINGS) ×1 IMPLANT
STOCKINETTE STRL 4IN 9604848 (GAUZE/BANDAGES/DRESSINGS) ×2 IMPLANT
SUT ETHILON 5-0 FS-2 18 BLK (SUTURE) ×2 IMPLANT

## 2021-05-04 NOTE — H&P (Signed)
The patient has been re-examined, and the chart reviewed, and there have been no interval changes to the documented history and physical.    The risks, benefits, and alternatives have been discussed at length. The patient expressed understanding of the risks benefits and agreed with plans for surgical intervention.  Halsey Hammen P. Drianna Chandran, [name] M.D.    

## 2021-05-04 NOTE — Transfer of Care (Signed)
Immediate Anesthesia Transfer of Care Note  Patient: [name]  Procedure(s) Performed: CARPAL TUNNEL RELEASE (Left: Wrist)  Patient Location: PACU  Anesthesia Type:General  Level of Consciousness: awake, oriented, drowsy and patient cooperative  Airway & Oxygen Therapy: Patient Spontanous Breathing and Patient connected to face mask oxygen  Post-op Assessment: Report given to RN and Post -op Vital signs reviewed and stable  Post vital signs: Reviewed and stable  Last Vitals:  Vitals Value Taken Time  BP 154/123 05/04/21 1330  Temp    Pulse 78 05/04/21 1333  Resp 30 05/04/21 1333  SpO2 97 % 05/04/21 1333  Vitals shown include unvalidated device data.  Last Pain:  Vitals:   05/04/21 1149  TempSrc: Oral  PainSc: 0-No pain         Complications: No notable events documented.

## 2021-05-04 NOTE — Op Note (Signed)
OPERATIVE NOTE  DATE OF SURGERY:  05/04/2021  PATIENT NAME:  Mathew Reyes   DOB: 02/16/1941  MRN: [id number]  PRE-OPERATIVE DIAGNOSIS: Left carpal tunnel syndrome  POST-OPERATIVE DIAGNOSIS:  Same  PROCEDURE:  Left carpal tunnel release  SURGEON:  Marciano Sequin. M.D.  ANESTHESIA: general  ESTIMATED BLOOD LOSS: Minimal  FLUIDS REPLACED: 700 mL of crystalloid  TOURNIQUET TIME: 31 minutes  DRAINS: None  INDICATIONS FOR SURGERY: Mathew Reyes is a 80 y.o. year old male with a long history of numbness and paresthesias to the left hand and thenar atrophy consistent with carpal tunnel syndrome.The patient had not seen any significant improvement despite conservative nonsurgical intervention. After discussion of the risks and benefits of surgical intervention, the patient expressed understanding of the risks benefits and agree with plans for carpal tunnel release.   PROCEDURE IN DETAIL: The patient was brought into the operating room and after adequate general anesthesia, a tourniquet was placed on the patient's left upper arm.The left hand and arm were prepped with alcohol and Duraprep and draped in the usual sterile fashion. A "time-out" was performed as per usual protocol. The hand and forearm were exsanguinated using an Esmarch and the tourniquet was inflated to 250 mmHg. Loupe magnification was used throughout the procedure. An incision was made just ulnar to the thenar palmar crease. Dissection was carried down through the palmar fascia to the transverse carpal ligament. The transverse carpal ligament was sharply incised, taking care to protect the underlying structures with the carpal tunnel. Complete release of the transverse carpal ligament was achieved. There was no evidence of ganglion cyst or lipoma within the carpal tunnel. The wound was irrigated with copious amounts of normal saline with antibiotic solution. The skin was then re-approximated with interrupted sutures of  #5-0 nylon. A sterile dressing was applied followed by application of a volar splint. The tourniquet was deflated with a total tourniquet time of 31 minutes.  The patient tolerated the procedure well and was transported to the PACU in stable condition.  Gates Jividen P. Holley Bouche., M.D.

## 2021-05-04 NOTE — Anesthesia Postprocedure Evaluation (Signed)
Anesthesia Post Note  Patient: MAKARI PORTMAN  Procedure(s) Performed: CARPAL TUNNEL RELEASE (Left: Wrist)  Patient location during evaluation: PACU Anesthesia Type: General Level of consciousness: awake and alert Pain management: pain level controlled Vital Signs Assessment: post-procedure vital signs reviewed and stable Respiratory status: spontaneous breathing, nonlabored ventilation, respiratory function stable and patient connected to nasal cannula oxygen Cardiovascular status: blood pressure returned to baseline and stable Postop Assessment: no apparent nausea or vomiting Anesthetic complications: no   No notable events documented.   Last Vitals:  Vitals:   05/04/21 1500 05/04/21 1508  BP: 118/82   Pulse: 79 76  Resp: 14 15  Temp:    SpO2: 93% 95%    Last Pain:  Vitals:   05/04/21 1508  TempSrc:   PainSc: 0-No pain                 Precious Haws Isidro Monks

## 2021-05-04 NOTE — Progress Notes (Signed)
Updated family that patient is doing well, just watching oxygen levels to make sure that they are okay. Dr. Magdalene Patricia says that patient is okay to go home, if oxygen levels are 88% or better. They are 94% at this time on 2 liters nasal cannula.

## 2021-05-04 NOTE — Anesthesia Preprocedure Evaluation (Signed)
Anesthesia Evaluation  Patient identified by MRN, date of birth, ID band Patient awake    Reviewed: Allergy & Precautions, NPO status , Patient's Chart, lab work & pertinent test results  History of Anesthesia Complications Negative for: history of anesthetic complications  Airway Mallampati: III  TM Distance: >3 FB Neck ROM: limited    Dental  (+) Chipped, Poor Dentition, Missing   Pulmonary shortness of breath and with exertion, COPD, Current Smoker and Patient abstained from smoking.,    Pulmonary exam normal        Cardiovascular hypertension, (-) angina+CHF  (-) Past MI Normal cardiovascular exam     Neuro/Psych PSYCHIATRIC DISORDERS negative neurological ROS     GI/Hepatic Neg liver ROS, GERD  Medicated and Controlled,  Endo/Other  negative endocrine ROS  Renal/GU      Musculoskeletal   Abdominal   Peds  Hematology negative hematology ROS (+)   Anesthesia Other Findings Past Medical History: No date: Abdominal hernia No date: Anxiety No date: Arthritis No date: Barrett's esophagus     Comment:  with h/o (+) low grade dysplasia No date: BPH (benign prostatic hyperplasia) No date: COPD (chronic obstructive pulmonary disease) (HCC) No date: DCM (dilated cardiomyopathy) (HCC) No date: DDD (degenerative disc disease), lumbar No date: Depression No date: DOE (dyspnea on exertion) No date: Erectile dysfunction No date: GERD (gastroesophageal reflux disease) No date: HFrEF (heart failure with reduced ejection fraction) (HCC) No date: HLD (hyperlipidemia) No date: Hypertension No date: Panlobular emphysema (HCC) No date: Prediabetes No date: Psoriasis No date: Situational anxiety No date: SOB (shortness of breath) No date: Tobacco abuse  Past Surgical History: No date: APPENDECTOMY No date: COLONOSCOPY, ESOPHAGOGASTRODUODENOSCOPY (EGD) AND ESOPHAGEAL  DILATION 03/20/2015: ESOPHAGOGASTRODUODENOSCOPY;  N/A     Comment:  Procedure: ESOPHAGOGASTRODUODENOSCOPY (EGD);  Surgeon:               Hulen Luster, MD;  Location: [facility] ENDOSCOPY;  Service:               Gastroenterology;  Laterality: N/A; 08/28/2015: ESOPHAGOGASTRODUODENOSCOPY; N/A     Comment:  Procedure: ESOPHAGOGASTRODUODENOSCOPY (EGD);  Surgeon:               Hulen Luster, MD;  Location: [facility] ENDOSCOPY;  Service:               Gastroenterology;  Laterality: N/A; 11/27/2015: ESOPHAGOGASTRODUODENOSCOPY; N/A     Comment:  Procedure: ESOPHAGOGASTRODUODENOSCOPY (EGD);  Surgeon:               Hulen Luster, MD;  Location: [facility] ENDOSCOPY;  Service:               Gastroenterology;  Laterality: N/A; No date: Hemmorrhoidectomy No date: HERNIA REPAIR No date: JOINT REPLACEMENT No date: Throid polyp 04/14/2020: TOTAL HIP ARTHROPLASTY; Left     Comment:  Procedure: TOTAL HIP ARTHROPLASTY;  Surgeon: Dereck Leep, MD;  Location: [facility] ORS;  Service: Orthopedics;               Laterality: Left;  BMI    Body Mass Index: 27.72 kg/m      Reproductive/Obstetrics negative OB ROS                             Anesthesia Physical Anesthesia Plan  ASA: 3  Anesthesia Plan: General LMA   Post-op Pain Management:  Induction: Intravenous  PONV Risk Score and Plan: Dexamethasone, Ondansetron, Midazolam and Treatment may vary due to age or medical condition  Airway Management Planned: LMA  Additional Equipment:   Intra-op Plan:   Post-operative Plan: Extubation in OR  Informed Consent: I have reviewed the patients History and Physical, chart, labs and discussed the procedure including the risks, benefits and alternatives for the proposed anesthesia with the patient or authorized representative who has indicated his/her understanding and acceptance.     Dental Advisory Given  Plan Discussed with: Anesthesiologist, CRNA and Surgeon  Anesthesia Plan Comments: (Patient consented for risks of anesthesia  including but not limited to:  - adverse reactions to medications - damage to eyes, teeth, lips or other oral mucosa - nerve damage due to positioning  - sore throat or hoarseness - Damage to heart, brain, nerves, lungs, other parts of body or loss of life  Patient voiced understanding.)        Anesthesia Quick Evaluation

## 2021-05-05 ENCOUNTER — Encounter: Payer: Self-pay | Admitting: Orthopedic Surgery

## 2022-03-02 ENCOUNTER — Other Ambulatory Visit: Payer: Self-pay | Admitting: Internal Medicine

## 2022-03-02 DIAGNOSIS — Z87891 Personal history of nicotine dependence: Secondary | ICD-10-CM

## 2022-03-02 DIAGNOSIS — I5022 Chronic systolic (congestive) heart failure: Secondary | ICD-10-CM

## 2022-03-09 ENCOUNTER — Ambulatory Visit
Admission: RE | Admit: 2022-03-09 | Discharge: 2022-03-09 | Disposition: A | Discharge status: Home / Self Care | Payer: Medicare Other | Source: Ambulatory Visit | Attending: Internal Medicine | Admitting: Internal Medicine

## 2022-03-09 DIAGNOSIS — I5022 Chronic systolic (congestive) heart failure: Secondary | ICD-10-CM | POA: Insufficient documentation

## 2022-03-09 DIAGNOSIS — Z87891 Personal history of nicotine dependence: Secondary | ICD-10-CM | POA: Insufficient documentation

## 2022-04-19 ENCOUNTER — Institutional Professional Consult (permissible substitution) (INDEPENDENT_AMBULATORY_CARE_PROVIDER_SITE_OTHER): Payer: Medicare Other | Admitting: Surgical

## 2022-04-19 VITALS — BP 123/72 | HR 68 | Resp 20 | Ht 67.0 in | Wt 182.0 lb

## 2022-04-19 DIAGNOSIS — I712 Thoracic aortic aneurysm, without rupture, unspecified: Secondary | ICD-10-CM | POA: Diagnosis not present

## 2022-04-19 NOTE — Patient Instructions (Signed)
As discussed concerning smoking cessation and lifestyle/activities that need to increase longevity and lower risk of thoracic aneurysm.  In particular managing high blood pressure closely.

## 2022-04-19 NOTE — Progress Notes (Signed)
Subjective:     Patient ID: AMI THORNSBERRY, male    DOB: November 09, 1940, 81 y.o.   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  This was found on a CT scan done on 03/10/2022.  The full report is as listed below.  He does have cardiac risk factors including hypertension, cardiomyopathy, congestive heart failure, prediabetes, history of tobacco abuse and hyperlipidemia.  He also has a history of COPD.  An echocardiogram done in 2016 showed a trileaflet aortic valve with normal ejection fraction at that time.  He denies chest pain but does have acid indigestion/reflux type symptoms.  He does have dyspnea on exertion.  He denies palpitations or lower extremity edema.  He does occasionally have some lightheadedness with cough.  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  Review of Systems  Constitutional: Negative.   HENT: Negative.    Eyes:        He has had a cataract surgery  Respiratory:  Positive for shortness of breath. Negative for cough, hemoptysis, sputum production and wheezing.   Cardiovascular:  Negative for chest pain, palpitations, orthopnea, claudication, leg swelling and PND.  Gastrointestinal:  Positive for heartburn.  Genitourinary:  Positive for frequency.  Skin:        Psoriasis  Neurological:  Positive for dizziness.  Endo/Heme/Allergies:  Bruises/bleeds easily.  Psychiatric/Behavioral: Negative.      Past Medical History:  Diagnosis Date   Abdominal hernia    Anxiety    Arthritis    Barrett's esophagus    with h/o (+) low grade dysplasia   BPH (benign prostatic hyperplasia)    COPD (chronic obstructive pulmonary disease) (HCC)    DCM (dilated cardiomyopathy) (HCC)    DDD (degenerative disc disease),  lumbar    Depression    DOE (dyspnea on exertion)    Erectile dysfunction    GERD (gastroesophageal reflux disease)    HFrEF (heart failure with reduced ejection fraction) (HCC)    HLD (hyperlipidemia)    Hypertension    Panlobular emphysema (HCC)    Prediabetes    Psoriasis    Situational anxiety    SOB (shortness of breath)    Tobacco abuse     Current Outpatient Medications  Medication Instructions   aspirin EC 81 mg, Oral, Daily, Swallow whole.   budesonide-formoterol (SYMBICORT) 160-4.5 MCG/ACT inhaler 2 puffs, Inhalation, 2 times daily   calcipotriene-betamethasone (TACLONEX) ointment 1 application , Topical, 2 times daily   calcium carbonate (OSCAL) 1500 (600 Ca) MG TABS tablet 600 mg of elemental calcium, Oral, Daily with breakfast   carvedilol (COREG) 3.125 mg, Oral, 2 times daily   celecoxib (CELEBREX) 200 mg, Oral, Daily   digoxin (LANOXIN) 0.125 mg, Oral, Daily   ENTRESTO 24-26 MG 1 tablet, Oral, Daily   finasteride (PROSCAR) 5 mg, Oral, Daily   Fish Oil 1,000 mg, Oral, Daily   FLUoxetine (PROZAC) 20 mg, Oral, Daily   HYDROcodone-acetaminophen (NORCO) 5-325 MG tablet 1-2 tablets, Oral, Every 4 hours PRN   levalbuterol (XOPENEX HFA) 45 MCG/ACT inhaler 1-2 puffs, Inhalation, 2 times daily   montelukast (SINGULAIR) 10 mg, Oral, Daily at bedtime   Multiple Vitamin (MULTIVITAMIN WITH MINERALS) TABS tablet 1 tablet, Oral, Daily   Na Sulfate-K Sulfate-Mg  Sulf 17.5-3.13-1.6 GM/177ML SOLN Oral   omeprazole (PRILOSEC) 20 mg, Oral, Daily   sildenafil (VIAGRA) 100 mg, Oral, As needed   tamsulosin (FLOMAX) 0.4 mg, Oral, 2 times daily   tiotropium (SPIRIVA) 18 mcg, Inhalation, Daily   vitamin C (ASCORBIC ACID) 500 mg, Oral, Daily       Objective:    There were no vitals taken for this visit. BP Readings from Last 3 Encounters:  04/19/22 123/72  05/04/21 115/74  09/16/20 (!) 154/91      Physical Exam Constitutional:      General: He is not in acute distress.     Appearance: Normal appearance. He is not ill-appearing.  HENT:     Head: Normocephalic and atraumatic.  Neck:     Vascular: No carotid bruit.  Cardiovascular:     Rate and Rhythm: Normal rate and regular rhythm.     Heart sounds: No murmur heard. Pulmonary:     Effort: Pulmonary effort is normal.     Breath sounds: No wheezing, rhonchi or rales.  Abdominal:     General: Abdomen is flat.     Palpations: Abdomen is soft. There is no mass.  Musculoskeletal:        General: No swelling.     Cervical back: No tenderness.     Right lower leg: No edema.     Left lower leg: No edema.  Lymphadenopathy:     Cervical: No cervical adenopathy.  Skin:    General: Skin is warm and dry.     Capillary Refill: Capillary refill takes less than 2 seconds.     Coloration: Skin is not jaundiced.     Findings: Bruising present.  Neurological:     General: No focal deficit present.     Mental Status: He is alert and oriented to person, place, and time.  Psychiatric:        Mood and Affect: Mood normal.        Behavior: Behavior normal.        Thought Content: Thought content normal.     No results found for any visits on 04/19/22.     Narrative & Impression  CLINICAL DATA:  CHF and COPD.  Smoking history.   EXAM: CT CHEST WITHOUT CONTRAST   TECHNIQUE: Multidetector CT imaging of the chest was performed following the standard protocol without IV contrast.   RADIATION DOSE REDUCTION: This exam was performed according to the departmental dose-optimization program which includes automated exposure control, adjustment of the mA and/or kV according to patient size and/or use of iterative reconstruction technique.   COMPARISON:  None Available.   FINDINGS: Cardiovascular: The heart size is normal. No substantial pericardial effusion. Coronary artery calcification is evident. Mild atherosclerotic calcification is noted in the wall of the thoracic aorta. Ascending thoracic aorta measures  4.8 cm diameter   Mediastinum/Nodes: No mediastinal lymphadenopathy. No evidence for gross hilar lymphadenopathy although assessment is limited by the lack of intravenous contrast on the current study. The esophagus has normal imaging features. There is no axillary lymphadenopathy.   Lungs/Pleura: No suspicious pulmonary nodule or mass. No focal airspace consolidation. No pleural effusion. Linear subsegmental atelectasis or scarring noted in the paraspinal right lower lobe and lingula.   Upper Abdomen: 3.3 cm cyst identified in the dome of the left liver. No followup recommended.   Musculoskeletal: No worrisome lytic or sclerotic osseous abnormality. Dense sclerotic lesion in the T12 vertebral body is likely a bone island.   IMPRESSION: 1. No acute  findings in the chest. No suspicious pulmonary nodule or mass. 2. 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 Ascending thoracic aortic aneurysm. Recommend semi-annual imaging followup by CTA or MRA and referral to cardiothoracic surgery if not already obtained. This recommendation follows 2010 ACCF/AHA/AATS/ACR/ASA/SCA/SCAI/SIR/STS/SVM Guidelines for the Diagnosis and Management of Patients With Thoracic Aortic Disease. Circulation. 2010; 121: B867-J449. Aortic aneurysm NOS (ICD10-I71.9) 3.  Aortic Atherosclerois (ICD10-170.0)     Electronically Signed   By: Misty Stanley M.D.   On: 03/10/2022 07:43   Assessment & Plan:   Problem List Items Addressed This Visit   None   No orders of the defined types were placed in this encounter. 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  Next appointment will be with MD since the patient's measurements are currently greater than 4.5 cm.  We discussed lifestyle management and in particular smoking cessation.  He understands the importance of this.  He has a blood pressure monitor at his home and will be monitoring.  He knows  significant episodes of severe chest pain that are different from his reflux should lead to presentation to the emergency department.  No follow-ups on file.  WJohn Giovanni PA-C

## 2022-09-04 ENCOUNTER — Other Ambulatory Visit: Payer: Self-pay

## 2022-09-04 ENCOUNTER — Encounter: Payer: Self-pay | Admitting: Emergency Medicine

## 2022-09-04 ENCOUNTER — Emergency Department
Admission: EM | Admit: 2022-09-04 | Discharge: 2022-09-04 | Disposition: A | Discharge status: Home / Self Care | Payer: Medicare Other | Attending: Emergency Medicine | Admitting: Emergency Medicine

## 2022-09-04 DIAGNOSIS — M25551 Pain in right hip: Secondary | ICD-10-CM | POA: Insufficient documentation

## 2022-09-04 MED ORDER — OXYCODONE-ACETAMINOPHEN 5-325 MG PO TABS: 1.0000 | ORAL_TABLET | Freq: Four times a day (QID) | ORAL | 0 refills | Status: DC | PRN

## 2022-09-04 NOTE — ED Provider Notes (Signed)
[facility] Provider Note  Patient Contact: 4:22 PM (approximate)   History   Hip Pain (/)   HPI  Mathew Reyes is a 81 y.o. male who presents the emergency department complaining of right hip pain.  Patient states that he has had nontraumatic right hip pain going on for roughly 2 weeks.  He states that he went and saw his Orthopedic provider at [facility] clinic and I can see their notes.  X-rays were reassuring and orthopedics recommends MRI.  Patient has had ongoing pain despite medication use.  He is on Ultram as needed, Flexeril, ibuprofen and prednisone.  Patient states that pain is particularly bad at nighttime when he is trying to get comfortable.  He states that he has limited pain unless he is bearing weight on the hip.  Pain is mostly located along the anterior hip.  Again no trauma prior to the onset of symptoms.  Patient has no back pain, no radicular symptoms, bowel or bladder function, saddle anesthesia or paresthesias.     Physical Exam   Triage Vital Signs: ED Triage Vitals  Enc Vitals Group     BP 09/04/22 1540 (!) 150/89     Pulse Rate 09/04/22 1540 78     Resp 09/04/22 1540 17     Temp 09/04/22 1540 98.2 F (36.8 C)     Temp Source 09/04/22 1540 Oral     SpO2 09/04/22 1540 92 %     Weight --      Height --      Head Circumference --      Peak Flow --      Pain Score 09/04/22 1538 10     Pain Loc --      Pain Edu? --      Excl. in [place]? --     Most recent vital signs: Vitals:   09/04/22 1540  BP: (!) 150/89  Pulse: 78  Resp: 17  Temp: 98.2 F (36.8 C)  SpO2: 92%     General: Alert and in no acute distress.  Cardiovascular:  Good peripheral perfusion Respiratory: Normal respiratory effort without tachypnea or retractions. Lungs CTAB.  Musculoskeletal: Full range of motion to all extremities.  Visualization of the right hip reveals no obvious signs of trauma or injury.  Patient is nontender along the lumbar spine,  posterior hip, lateral hip.  He is tender along the anterior hip along the proximal quadriceps. Neurologic:  No gross focal neurologic deficits are appreciated.  Skin:   No rash noted Other:   ED Results / Procedures / Treatments   Labs (all labs ordered are listed, but only abnormal results are displayed) Labs Reviewed - No data to display   EKG     RADIOLOGY    No results found.  PROCEDURES:  Critical Care performed: No  Procedures   MEDICATIONS ORDERED IN ED: Medications - No data to display   IMPRESSION / MDM / [place] / ED COURSE  I reviewed the triage vital signs and the nursing notes.                              Differential diagnosis includes, but is not limited to, hip pain, quadricep injury, bursitis, fracture, sciatica, radicular pain   Patient's presentation is most consistent with acute presentation with potential threat to life or bodily function.   Patient's diagnosis is consistent with right hip pain.  Patient  presents to the emergency department complaining of ongoing right hip pain.  Is already seen orthopedics and is scheduled for an outpatient MRI.  Patient states that the pain began nontraumatic leg.  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  Patient was on multiple medications and presents for ongoing pain.  There is no concerning neuro symptoms.  No recent trauma to be suspicious for underlying fracture.  This time will increase patient's pain medication, advised him to follow-up with orthopedics.  He is again already scheduled for an outpatient MRI.Marland Kitchen  [place] signs and symptoms are discussed with the patient and his wife.  Patient is given ED precautions to return to the ED for any worsening or new symptoms.        FINAL CLINICAL IMPRESSION(S) / ED DIAGNOSES   Final diagnoses:  Right hip pain     Rx / DC Orders   ED Discharge Orders      None        Note:  This document was prepared using Dragon voice recognition software and may include unintentional dictation errors.   Brynda Peon 09/04/22 1710    Rada Hay, MD 09/04/22 1806

## 2022-09-04 NOTE — ED Triage Notes (Signed)
Pt to ED via POV for right hip and leg pain. Pt states that he say his doctor last week but it is not getting better. Pt is currently in NAD.

## 2022-09-04 NOTE — Discharge Instructions (Addendum)
You may take at 1000 mg of Tylenol every 6 hours.  If you do not take the Percocet you may take 2 of the extra strength Tylenol.  If you do take the Percocet you may take only 1 extra strength Tylenol.  Your pain medicine may be taken every 6 hours as well.  If the pain is only slightly bad take the Ultram/tramadol if the pain is sharp or severe take the Percocet.  Do not take both at the same time as both are narcotics and is too much narcotic to be taken at same time.  Keep taking the rest of your medications to include the prednisone, muscle relaxer, Motrin.  I recommend taking Percocet and muscle relaxer before bedtime to help with sleeping.

## 2022-09-12 IMAGING — CT CT CHEST W/O CM
2 of 4 series · 15 of 36 positions shown, 18 images · non-contrast
Comparison: None Available.

CLINICAL DATA: CHF and COPD.  Smoking history.



[Series 2: chest 2.00 · axial · 0.74mm/px · z∈[-1212,-938]mm · 12 of 163 slices shown, 15 images]
[im 13/163  mediastinal]
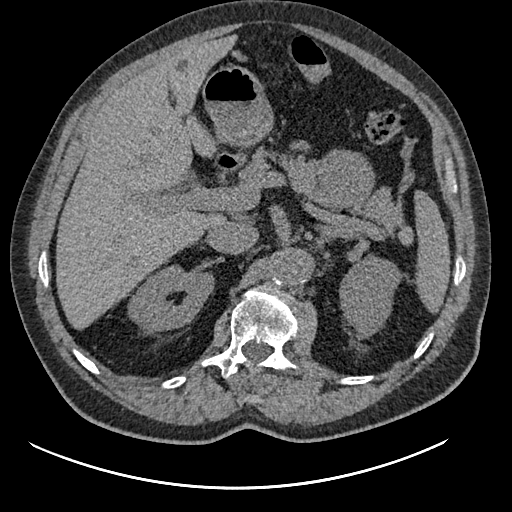
[im 13/163  lung]
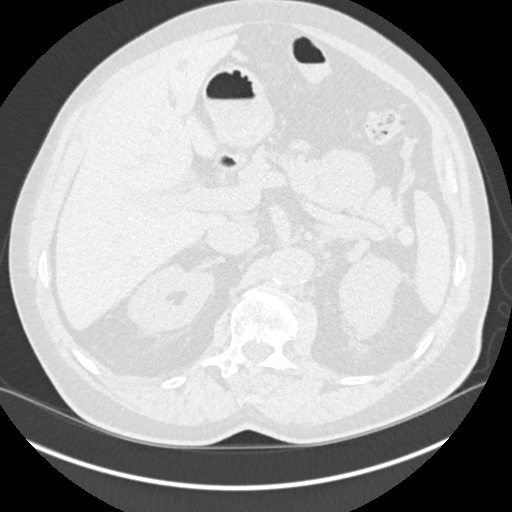
[im 25/163  lung]
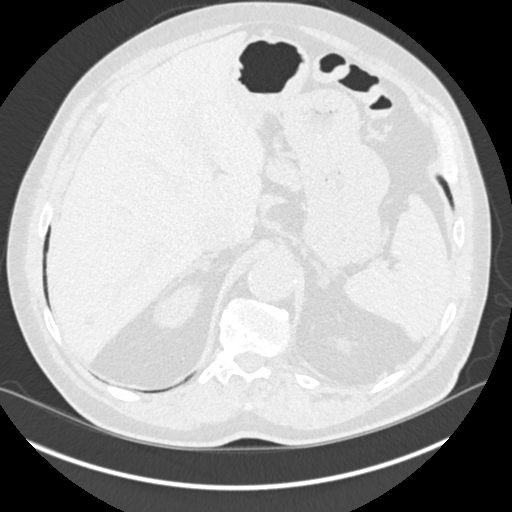
[im 38/163  lung]
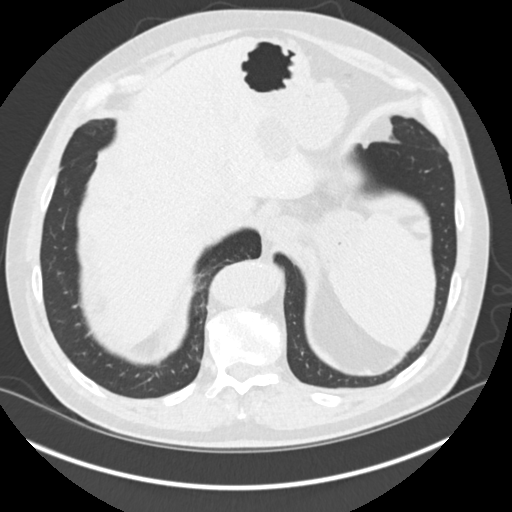
[im 50/163  lung]
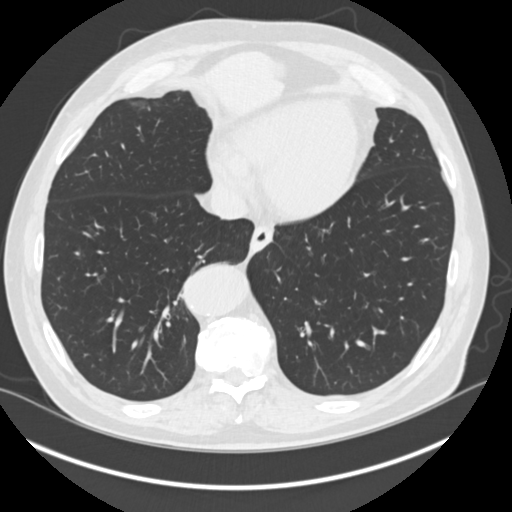
[im 63/163  mediastinal]
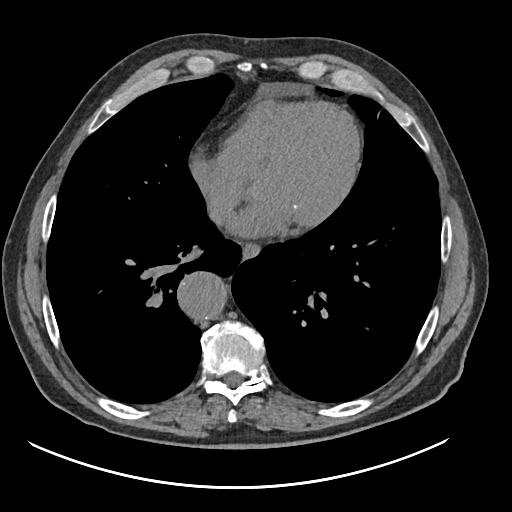
[im 63/163  lung]
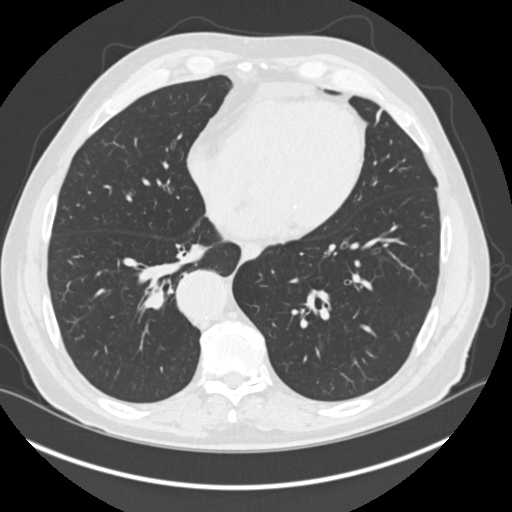
[im 75/163  lung]
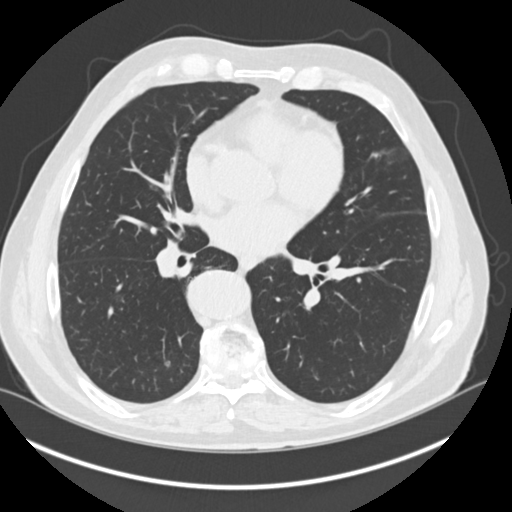
[im 88/163  lung]
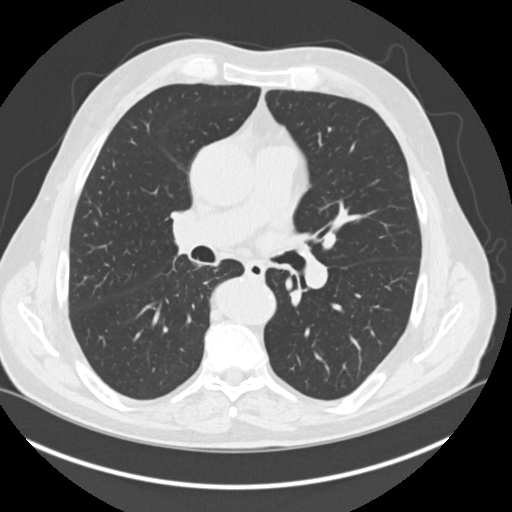
[im 100/163  lung]
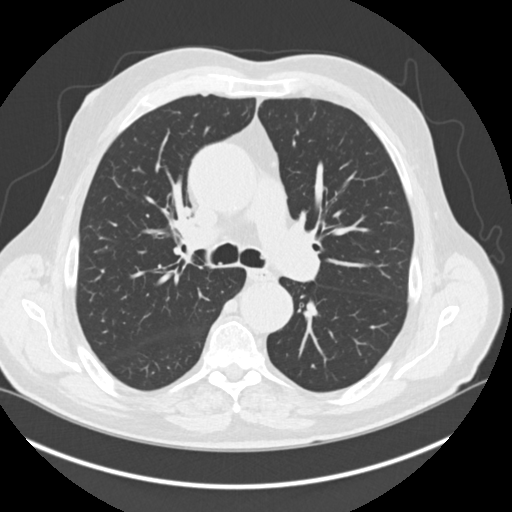
[im 113/163  mediastinal]
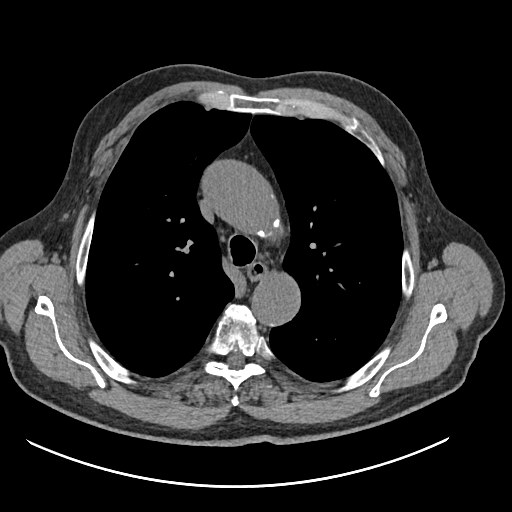
[im 113/163  lung]
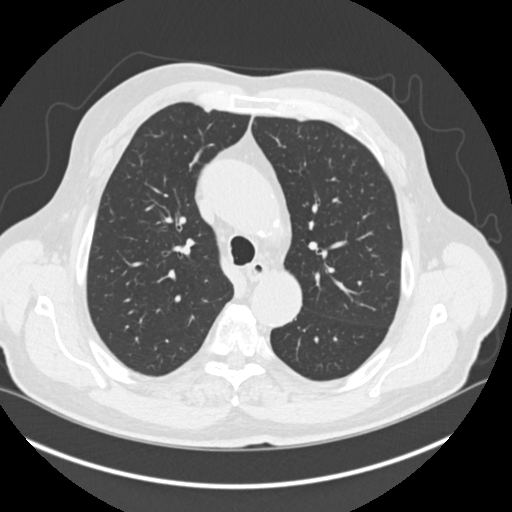
[im 125/163  lung]
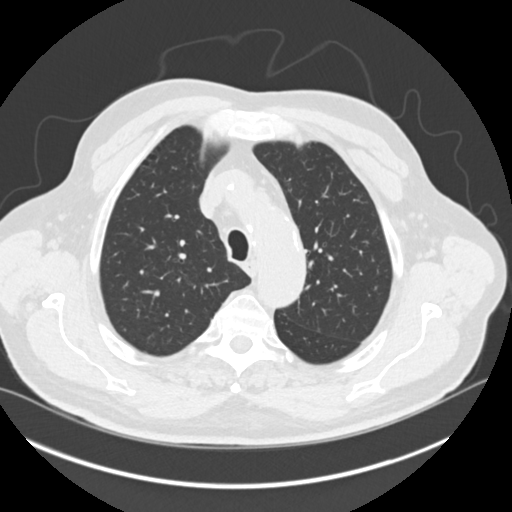
[im 138/163  lung]
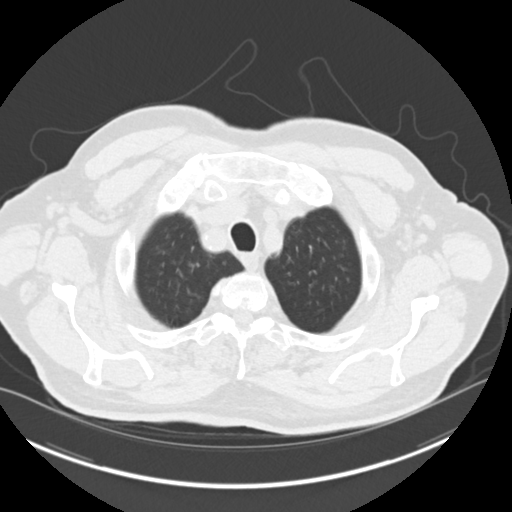
[im 150/163  lung]
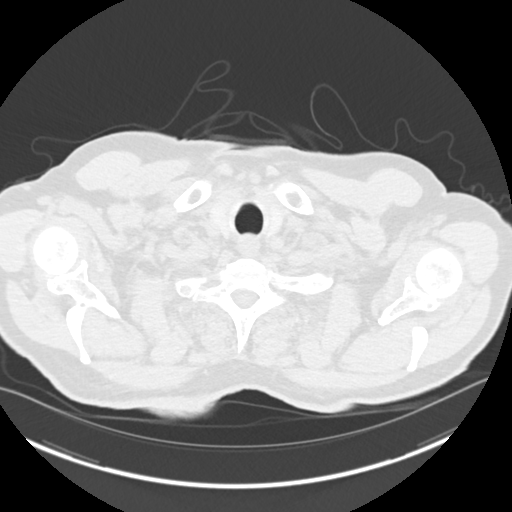

[Series 5: coronals chest 2.00 cor · coronal · 0.64mm/px · 3 of 189 slices shown]
[im 38/189  lung]
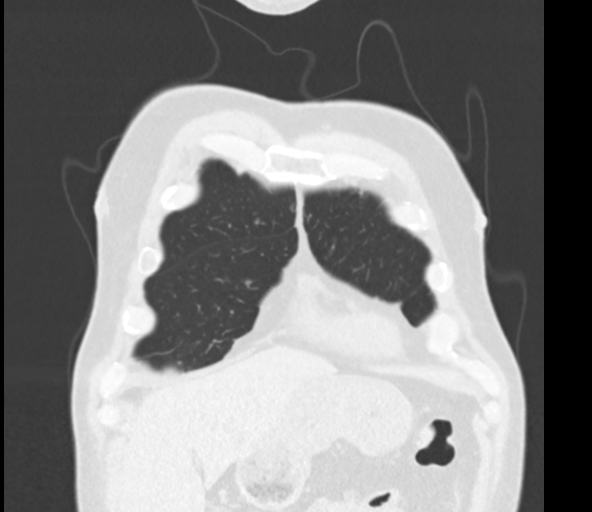
[im 76/189  lung]
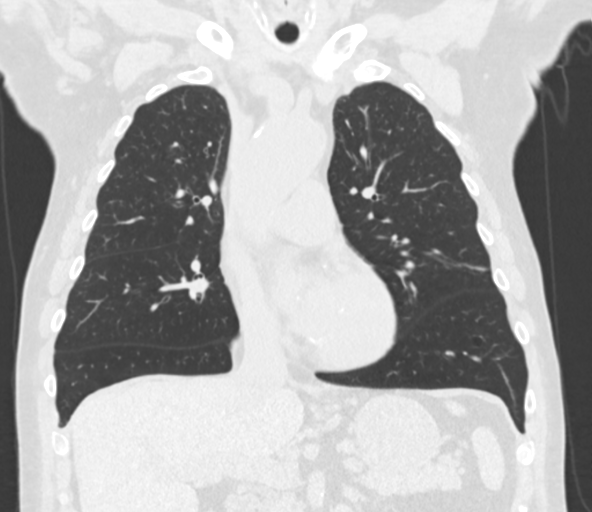
[im 113/189  lung]
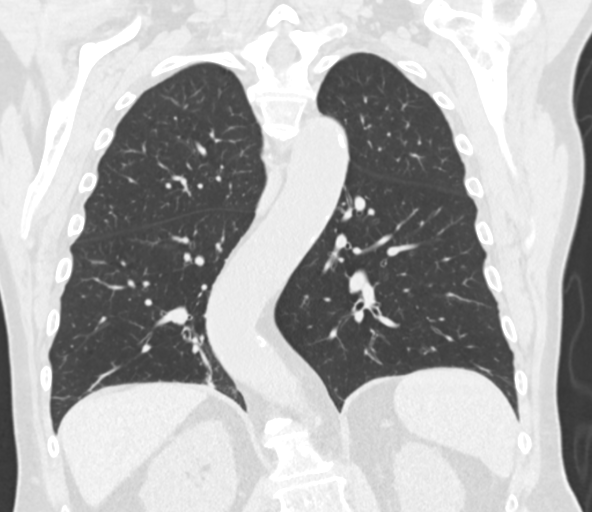

[15 of 36 positions shown; findings below may reference images not displayed]

FINDINGS: Cardiovascular: The heart size is normal. No substantial pericardial
effusion. Coronary artery calcification is evident. Mild
atherosclerotic calcification is noted in the wall of the thoracic
aorta. Ascending thoracic aorta measures 4.8 cm diameter

Mediastinum/Nodes: No mediastinal lymphadenopathy. No evidence for
gross hilar lymphadenopathy although assessment is limited by the
lack of intravenous contrast on the current study. The esophagus has
normal imaging features. There is no axillary lymphadenopathy.

Lungs/Pleura: No suspicious pulmonary nodule or mass. No focal
airspace consolidation. No pleural effusion. Linear subsegmental
atelectasis or scarring noted in the paraspinal right lower lobe and
lingula.

Upper Abdomen: 3.3 cm cyst identified in the dome of the left liver.
No followup recommended.

Musculoskeletal: No worrisome lytic or sclerotic osseous
abnormality. Dense sclerotic lesion in the T12 vertebral body is
likely a bone island.
IMPRESSION: 1. No acute findings in the chest. No suspicious pulmonary nodule or
mass.
2. 4.8 cm ascending thoracic aortic aneurysm. Ascending thoracic
aortic aneurysm. Recommend semi-annual imaging followup by CTA or
MRA and referral to cardiothoracic surgery if not already obtained.
This recommendation follows 5515
ACCF/AHA/AATS/ACR/ASA/SCA/DONEO/TANU/CHRISTOPHE/DOLLAR Guidelines for the
Diagnosis and Management of Patients With Thoracic Aortic Disease.
Circulation. 5515; 121: E266-e369. Aortic aneurysm NOS (HJ6AH-R8A.N)
3.  Aortic Atherosclerois (HJ6AH-170.0)

## 2022-09-14 ENCOUNTER — Other Ambulatory Visit: Payer: Self-pay | Admitting: Orthopedic Surgery

## 2022-09-14 DIAGNOSIS — M25551 Pain in right hip: Secondary | ICD-10-CM

## 2022-09-14 DIAGNOSIS — M4807 Spinal stenosis, lumbosacral region: Secondary | ICD-10-CM

## 2022-09-14 DIAGNOSIS — M5441 Lumbago with sciatica, right side: Secondary | ICD-10-CM

## 2022-09-15 ENCOUNTER — Other Ambulatory Visit: Payer: Self-pay | Admitting: Orthopedic Surgery

## 2022-09-15 ENCOUNTER — Encounter: Payer: Self-pay | Admitting: Orthopedic Surgery

## 2022-09-15 DIAGNOSIS — M5441 Lumbago with sciatica, right side: Secondary | ICD-10-CM

## 2022-09-15 DIAGNOSIS — M1611 Unilateral primary osteoarthritis, right hip: Secondary | ICD-10-CM

## 2022-09-15 DIAGNOSIS — M4807 Spinal stenosis, lumbosacral region: Secondary | ICD-10-CM

## 2022-09-15 DIAGNOSIS — M25551 Pain in right hip: Secondary | ICD-10-CM

## 2022-09-20 ENCOUNTER — Ambulatory Visit (INDEPENDENT_AMBULATORY_CARE_PROVIDER_SITE_OTHER): Payer: Medicare Other | Admitting: Urology

## 2022-09-20 ENCOUNTER — Encounter: Payer: Self-pay | Admitting: Urology

## 2022-09-20 VITALS — BP 128/76 | HR 91 | Ht 67.0 in | Wt 175.0 lb

## 2022-09-20 DIAGNOSIS — R972 Elevated prostate specific antigen [PSA]: Secondary | ICD-10-CM | POA: Diagnosis not present

## 2022-09-20 NOTE — Progress Notes (Signed)
09/20/2022 9:25 AM   Mathew Reyes January 15, 1941 [id number]  Referring provider: Idelle Crouch, MD [facility] [place],  [place] [address]  Chief Complaint  Patient presents with   Elevated PSA    HPI: 81 y.o. male followed for elevated PSA and presents for follow-up at the request of Dr. Doy Hutching.  Last seen 08/2019 for PSA 7.71.  Elected observation and a follow-up PSA January 2021 was 5.4 PSA has fluctuated with last PSA 08/17/2022 9.65     PMH: Past Medical History:  Diagnosis Date   Abdominal hernia    Anxiety    Arthritis    Barrett's esophagus    with h/o (+) low grade dysplasia   BPH (benign prostatic hyperplasia)    COPD (chronic obstructive pulmonary disease) (HCC)    DCM (dilated cardiomyopathy) (HCC)    DDD (degenerative disc disease), lumbar    Depression    DOE (dyspnea on exertion)    Erectile dysfunction    GERD (gastroesophageal reflux disease)    HFrEF (heart failure with reduced ejection fraction) (HCC)    HLD (hyperlipidemia)    Hypertension    Panlobular emphysema (HCC)    Prediabetes    Psoriasis    Situational anxiety    SOB (shortness of breath)    Tobacco abuse     Surgical History: Past Surgical History:  Procedure Laterality Date   APPENDECTOMY     CARPAL TUNNEL RELEASE Left 05/04/2021   Procedure: CARPAL TUNNEL RELEASE;  Surgeon: Dereck Leep, MD;  Location: [facility] ORS;  Service: Orthopedics;  Laterality: Left;   COLONOSCOPY, ESOPHAGOGASTRODUODENOSCOPY (EGD) AND ESOPHAGEAL DILATION     ESOPHAGOGASTRODUODENOSCOPY N/A 03/20/2015   Procedure: ESOPHAGOGASTRODUODENOSCOPY (EGD);  Surgeon: Hulen Luster, MD;  Location: [facility] ENDOSCOPY;  Service: Gastroenterology;  Laterality: N/A;   ESOPHAGOGASTRODUODENOSCOPY N/A 08/28/2015   Procedure: ESOPHAGOGASTRODUODENOSCOPY (EGD);  Surgeon: Hulen Luster, MD;  Location: [facility] ENDOSCOPY;  Service: Gastroenterology;  Laterality: N/A;   ESOPHAGOGASTRODUODENOSCOPY N/A 11/27/2015    Procedure: ESOPHAGOGASTRODUODENOSCOPY (EGD);  Surgeon: Hulen Luster, MD;  Location: [facility] ENDOSCOPY;  Service: Gastroenterology;  Laterality: N/A;   Hemmorrhoidectomy     HERNIA REPAIR     JOINT REPLACEMENT     Throid polyp     TOTAL HIP ARTHROPLASTY Left 04/14/2020   Procedure: TOTAL HIP ARTHROPLASTY;  Surgeon: Dereck Leep, MD;  Location: [facility] ORS;  Service: Orthopedics;  Laterality: Left;    Home Medications:  Allergies as of 09/20/2022   No Known Allergies      Medication List        Accurate as of September 20, 2022  9:25 AM. If you have any questions, ask your nurse or doctor.          ascorbic acid 500 MG tablet Commonly known as: VITAMIN C Take 500 mg by mouth daily.   aspirin EC 81 MG tablet Take 81 mg by mouth daily. Swallow whole.   budesonide-formoterol 160-4.5 MCG/ACT inhaler Commonly known as: SYMBICORT Inhale 2 puffs into the lungs 2 (two) times daily.   calcipotriene-betamethasone ointment Commonly known as: TACLONEX Apply 1 application topically in the morning and at bedtime.   calcium carbonate 1500 (600 Ca) MG Tabs tablet Commonly known as: OSCAL Take 600 mg of elemental calcium by mouth daily with breakfast.   carvedilol 3.125 MG tablet Commonly known as: COREG Take 3.125 mg by mouth 2 (two) times daily.   digoxin 0.125 MG tablet Commonly known as: LANOXIN Take 0.125 mg by  mouth daily.   Entresto 24-26 MG Generic drug: sacubitril-valsartan Take 1 tablet by mouth daily.   finasteride 5 MG tablet Commonly known as: Proscar Take 1 tablet (5 mg total) by mouth daily.   Fish Oil 1000 MG Caps Take 1,000 mg by mouth daily.   FLUoxetine 20 MG capsule Commonly known as: PROZAC Take 20 mg by mouth daily.   levalbuterol 45 MCG/ACT inhaler Commonly known as: XOPENEX HFA Inhale 1-2 puffs into the lungs in the morning and at bedtime.   montelukast 10 MG tablet Commonly known as: SINGULAIR Take 10 mg by mouth at bedtime.   multivitamin  with minerals Tabs tablet Take 1 tablet by mouth daily.   omeprazole 20 MG capsule Commonly known as: PRILOSEC Take 20 mg by mouth daily.   oxyCODONE-acetaminophen 5-325 MG tablet Commonly known as: PERCOCET/ROXICET Take 1 tablet by mouth every 6 (six) hours as needed for severe pain.   sildenafil 100 MG tablet Commonly known as: VIAGRA Take 100 mg by mouth as needed for erectile dysfunction.   tamsulosin 0.4 MG Caps capsule Commonly known as: FLOMAX Take 0.4 mg by mouth in the morning and at bedtime.   tiotropium 18 MCG inhalation capsule Commonly known as: SPIRIVA Place 18 mcg into inhaler and inhale daily.        Allergies: No Known Allergies  Family History: Family History  Problem Relation Age of Onset   Alzheimer's disease Mother    Aortic aneurysm Father    Kidney disease Neg Hx    Prostate cancer Neg Hx     Social History:  reports that he has been smoking cigarettes. He has a 60.00 pack-year smoking history. He has never used smokeless tobacco. He reports current alcohol use. He reports that he does not use drugs.   Physical Exam: BP 128/76   Pulse 91   Ht '5\' 7"'$  (1.702 m)   Wt 175 lb (79.4 kg)   BMI 27.41 kg/m   Constitutional:  Alert and oriented, No acute distress. HEENT: [place] AT Respiratory: Normal respiratory effort, no increased work of breathing. GU: Prostate 35 g, smooth without nodules Psychiatric: Normal mood and affect.   Assessment & Plan:    1.  Elevated PSA We again reviewed potential causes including prostate cancer, BPH and inflammation.  Options of prostate biopsy, continued surveillance and prostate MRI were reviewed.  He would like to schedule MRI   Abbie Sons, MD  [facility] [address] [place], [place] [address] [phone]

## 2022-09-21 ENCOUNTER — Ambulatory Visit
Admission: RE | Admit: 2022-09-21 | Discharge: 2022-09-21 | Disposition: A | Discharge status: Home / Self Care | Payer: Medicare Other | Source: Ambulatory Visit | Attending: Orthopedic Surgery | Admitting: Orthopedic Surgery

## 2022-09-21 DIAGNOSIS — M5441 Lumbago with sciatica, right side: Secondary | ICD-10-CM | POA: Insufficient documentation

## 2022-09-21 DIAGNOSIS — M1611 Unilateral primary osteoarthritis, right hip: Secondary | ICD-10-CM | POA: Insufficient documentation

## 2022-09-21 DIAGNOSIS — M4807 Spinal stenosis, lumbosacral region: Secondary | ICD-10-CM | POA: Insufficient documentation

## 2022-09-21 DIAGNOSIS — M25551 Pain in right hip: Secondary | ICD-10-CM | POA: Insufficient documentation

## 2022-09-24 ENCOUNTER — Other Ambulatory Visit: Payer: Self-pay | Admitting: Thoracic Surgery (Cardiothoracic Vascular Surgery)

## 2022-09-24 DIAGNOSIS — I7121 Aneurysm of the ascending aorta, without rupture: Secondary | ICD-10-CM

## 2022-10-04 ENCOUNTER — Ambulatory Visit
Admission: RE | Admit: 2022-10-04 | Discharge: 2022-10-04 | Disposition: A | Discharge status: Home / Self Care | Payer: Medicare Other | Source: Ambulatory Visit | Attending: Urology | Admitting: Urology

## 2022-10-04 DIAGNOSIS — R972 Elevated prostate specific antigen [PSA]: Secondary | ICD-10-CM | POA: Insufficient documentation

## 2022-10-04 MED ORDER — GADOBUTROL 1 MMOL/ML IV SOLN
7.0000 mL | Freq: Once | INTRAVENOUS | Status: AC | PRN
  Administered 2022-10-04: 7 mL via INTRAVENOUS

## 2022-10-06 ENCOUNTER — Telehealth: Payer: Self-pay | Admitting: *Deleted

## 2022-10-06 NOTE — Telephone Encounter (Signed)
-----   Message from Abbie Sons, MD sent at 10/05/2022  2:47 PM EST ----- Prostate MRI did show abnormalities that were suspicious for prostate cancer.  Recommend follow-up visit to discuss options

## 2022-10-06 NOTE — Telephone Encounter (Signed)
Notified patient as instructed, patient pleased °

## 2022-10-18 ENCOUNTER — Ambulatory Visit (INDEPENDENT_AMBULATORY_CARE_PROVIDER_SITE_OTHER): Payer: Medicare Other | Admitting: Urology

## 2022-10-18 ENCOUNTER — Encounter: Payer: Self-pay | Admitting: Urology

## 2022-10-18 VITALS — BP 148/79 | HR 71 | Ht 67.0 in | Wt 175.0 lb

## 2022-10-18 DIAGNOSIS — R972 Elevated prostate specific antigen [PSA]: Secondary | ICD-10-CM | POA: Diagnosis not present

## 2022-10-18 NOTE — Progress Notes (Signed)
10/18/2022 12:14 PM   Mathew Reyes 1940/12/19 [id number]  Referring provider: Idelle Crouch, MD [facility] [place],  [place] [address]  Chief Complaint  Patient presents with   Follow-up    HPI: 81 y.o. male presents for follow-up visit to discuss MRI results  PSA bump to 9.65 in early October 23     MRI with prostate volume 26 cc.  PI-RADS 5 lesion noted right posterolateral/posteromedial PZ at base and mid gland; PI-RADS 4 lesion left anterior and left posterolateral PZ in the mid gland; no pelvic adenopathy or evidence of extracapsular extension  PMH: Past Medical History:  Diagnosis Date   Abdominal hernia    Anxiety    Arthritis    Barrett's esophagus    with h/o (+) low grade dysplasia   BPH (benign prostatic hyperplasia)    COPD (chronic obstructive pulmonary disease) (HCC)    DCM (dilated cardiomyopathy) (HCC)    DDD (degenerative disc disease), lumbar    Depression    DOE (dyspnea on exertion)    Erectile dysfunction    GERD (gastroesophageal reflux disease)    HFrEF (heart failure with reduced ejection fraction) (HCC)    HLD (hyperlipidemia)    Hypertension    Panlobular emphysema (HCC)    Prediabetes    Psoriasis    Situational anxiety    SOB (shortness of breath)    Tobacco abuse     Surgical History: Past Surgical History:  Procedure Laterality Date   APPENDECTOMY     CARPAL TUNNEL RELEASE Left 05/04/2021   Procedure: CARPAL TUNNEL RELEASE;  Surgeon: Dereck Leep, MD;  Location: [facility] ORS;  Service: Orthopedics;  Laterality: Left;   COLONOSCOPY, ESOPHAGOGASTRODUODENOSCOPY (EGD) AND ESOPHAGEAL DILATION     ESOPHAGOGASTRODUODENOSCOPY N/A 03/20/2015   Procedure: ESOPHAGOGASTRODUODENOSCOPY (EGD);  Surgeon: Hulen Luster, MD;  Location: [facility] ENDOSCOPY;  Service: Gastroenterology;  Laterality: N/A;   ESOPHAGOGASTRODUODENOSCOPY N/A 08/28/2015   Procedure: ESOPHAGOGASTRODUODENOSCOPY (EGD);  Surgeon: Hulen Luster, MD;   Location: [facility] ENDOSCOPY;  Service: Gastroenterology;  Laterality: N/A;   ESOPHAGOGASTRODUODENOSCOPY N/A 11/27/2015   Procedure: ESOPHAGOGASTRODUODENOSCOPY (EGD);  Surgeon: Hulen Luster, MD;  Location: [facility] ENDOSCOPY;  Service: Gastroenterology;  Laterality: N/A;   Hemmorrhoidectomy     HERNIA REPAIR     JOINT REPLACEMENT     Throid polyp     TOTAL HIP ARTHROPLASTY Left 04/14/2020   Procedure: TOTAL HIP ARTHROPLASTY;  Surgeon: Dereck Leep, MD;  Location: [facility] ORS;  Service: Orthopedics;  Laterality: Left;    Home Medications:  Allergies as of 10/18/2022   No Known Allergies      Medication List        Accurate as of October 18, 2022 12:14 PM. If you have any questions, ask your nurse or doctor.          STOP taking these medications    oxyCODONE-acetaminophen 5-325 MG tablet Commonly known as: PERCOCET/ROXICET Stopped by: Abbie Sons, MD       TAKE these medications    ascorbic acid 500 MG tablet Commonly known as: VITAMIN C Take 500 mg by mouth daily.   aspirin EC 81 MG tablet Take 81 mg by mouth daily. Swallow whole.   budesonide-formoterol 160-4.5 MCG/ACT inhaler Commonly known as: SYMBICORT Inhale 2 puffs into the lungs 2 (two) times daily.   calcipotriene-betamethasone ointment Commonly known as: TACLONEX Apply 1 application topically in the morning and at bedtime.   calcium carbonate 1500 (600 Ca) MG Tabs  tablet Commonly known as: OSCAL Take 600 mg of elemental calcium by mouth daily with breakfast.   carvedilol 3.125 MG tablet Commonly known as: COREG Take 3.125 mg by mouth 2 (two) times daily.   digoxin 0.125 MG tablet Commonly known as: LANOXIN Take 0.125 mg by mouth daily.   Entresto 24-26 MG Generic drug: sacubitril-valsartan Take 1 tablet by mouth daily.   finasteride 5 MG tablet Commonly known as: Proscar Take 1 tablet (5 mg total) by mouth daily.   Fish Oil 1000 MG Caps Take 1,000 mg by mouth daily.   FLUoxetine 20 MG  capsule Commonly known as: PROZAC Take 20 mg by mouth daily.   levalbuterol 45 MCG/ACT inhaler Commonly known as: XOPENEX HFA Inhale 1-2 puffs into the lungs in the morning and at bedtime.   montelukast 10 MG tablet Commonly known as: SINGULAIR Take 10 mg by mouth at bedtime.   multivitamin with minerals Tabs tablet Take 1 tablet by mouth daily.   omeprazole 20 MG capsule Commonly known as: PRILOSEC Take 20 mg by mouth daily.   sildenafil 100 MG tablet Commonly known as: VIAGRA Take 100 mg by mouth as needed for erectile dysfunction.   tamsulosin 0.4 MG Caps capsule Commonly known as: FLOMAX Take 0.4 mg by mouth in the morning and at bedtime.   tiotropium 18 MCG inhalation capsule Commonly known as: SPIRIVA Place 18 mcg into inhaler and inhale daily.        Allergies: No Known Allergies  Family History: Family History  Problem Relation Age of Onset   Alzheimer's disease Mother    Aortic aneurysm Father    Kidney disease Neg Hx    Prostate cancer Neg Hx     Social History:  reports that he has been smoking cigarettes. He has a 60.00 pack-year smoking history. He has never used smokeless tobacco. He reports current alcohol use. He reports that he does not use drugs.   Physical Exam: BP (!) 148/79   Pulse 71   Ht '5\' 7"'$  (1.702 m)   Wt 175 lb (79.4 kg)   BMI 27.41 kg/m   Constitutional:  Alert and oriented, No acute distress. HEENT: [place] AT Respiratory: Normal respiratory effort, no increased work of breathing. Psychiatric: Normal mood and affect.   Assessment & Plan:    1.  Elevated PSA We discussed that PI-RADS 4 and 5 lesions are suspicious for high-grade prostate cancer.  MR fusion biopsy was discussed and that we are currently utilizing the urology practice in [place], [facility] to perform these biopsies for our practice.  The option of cognitive biopsy in office was also discussed.  He has requested referral to [facility] for  fusion biopsy and will follow-up here with the results.   Abbie Sons, [place] [address] [place], [place] [address] [phone]

## 2022-10-19 ENCOUNTER — Ambulatory Visit: Payer: Medicare Other

## 2022-11-09 ENCOUNTER — Ambulatory Visit (INDEPENDENT_AMBULATORY_CARE_PROVIDER_SITE_OTHER): Payer: Medicare Other | Admitting: Thoracic Surgery (Cardiothoracic Vascular Surgery)

## 2022-11-09 ENCOUNTER — Ambulatory Visit
Admission: RE | Admit: 2022-11-09 | Discharge: 2022-11-09 | Disposition: A | Discharge status: Home / Self Care | Payer: BLUE CROSS/BLUE SHIELD | Source: Ambulatory Visit | Attending: Thoracic Surgery (Cardiothoracic Vascular Surgery) | Admitting: Thoracic Surgery (Cardiothoracic Vascular Surgery)

## 2022-11-09 ENCOUNTER — Encounter: Payer: Self-pay | Admitting: Thoracic Surgery (Cardiothoracic Vascular Surgery)

## 2022-11-09 VITALS — BP 131/78 | HR 75 | Resp 20 | Ht 67.0 in | Wt 177.0 lb

## 2022-11-09 DIAGNOSIS — I712 Thoracic aortic aneurysm, without rupture, unspecified: Secondary | ICD-10-CM | POA: Insufficient documentation

## 2022-11-09 DIAGNOSIS — I7121 Aneurysm of the ascending aorta, without rupture: Secondary | ICD-10-CM | POA: Diagnosis not present

## 2022-11-09 MED ORDER — IOPAMIDOL (ISOVUE-370) INJECTION 76%
75.0000 mL | Freq: Once | INTRAVENOUS | Status: AC | PRN
  Administered 2022-11-09: 75 mL via INTRAVENOUS

## 2022-11-09 NOTE — Progress Notes (Signed)
[address]       [place] [address]             [phone]     HPI: Mr. Grainger returns for follow-up of his ascending aneurysm  Keri Tavella is an 82 year old man with a past history significant for an ascending thoracic aortic aneurysm, thoracic aortic atherosclerosis, tobacco abuse, COPD, hypertension, hyperlipidemia, congestive heart failure, reflux, Barrett's esophagus, prediabetes, psoriasis, anxiety and depression.  He was found to have an ascending thoracic aortic aneurysm on a CT in May 2023.  It was a noncontrast scan and the aorta was estimated to be 4.8 cm.  In the interim since his last visit he has been feeling well.  He is not having any new respiratory problems.  Continues to smoke about a pack of cigarettes daily.  No chest pain, pressure, or tightness.  Past Medical History:  Diagnosis Date   Abdominal hernia    Anxiety    Arthritis    Barrett's esophagus    with h/o (+) low grade dysplasia   BPH (benign prostatic hyperplasia)    COPD (chronic obstructive pulmonary disease) (HCC)    DCM (dilated cardiomyopathy) (HCC)    DDD (degenerative disc disease), lumbar    Depression    DOE (dyspnea on exertion)    Erectile dysfunction    GERD (gastroesophageal reflux disease)    HFrEF (heart failure with reduced ejection fraction) (HCC)    HLD (hyperlipidemia)    Hypertension    Panlobular emphysema (HCC)    Prediabetes    Psoriasis    Situational anxiety    SOB (shortness of breath)    Tobacco abuse     Current Outpatient Medications  Medication Sig Dispense Refill   aspirin EC 81 MG tablet Take 81 mg by mouth daily. Swallow whole.     budesonide-formoterol (SYMBICORT) 160-4.5 MCG/ACT inhaler Inhale 2 puffs into the lungs 2 (two) times daily.     calcipotriene-betamethasone (TACLONEX) ointment Apply 1 application topically in the morning and at bedtime.     calcium carbonate (OSCAL) 1500 (600 Ca) MG TABS tablet Take 600 mg of  elemental calcium by mouth daily with breakfast.     carvedilol (COREG) 3.125 MG tablet Take 3.125 mg by mouth 2 (two) times daily.      digoxin (LANOXIN) 0.125 MG tablet Take 0.125 mg by mouth daily.   0   ENTRESTO 24-26 MG Take 1 tablet by mouth daily.     finasteride (PROSCAR) 5 MG tablet Take 1 tablet (5 mg total) by mouth daily. 90 tablet 4   FLUoxetine (PROZAC) 20 MG capsule Take 20 mg by mouth daily.     levalbuterol (XOPENEX HFA) 45 MCG/ACT inhaler Inhale 1-2 puffs into the lungs in the morning and at bedtime.     montelukast (SINGULAIR) 10 MG tablet Take 10 mg by mouth at bedtime.      Multiple Vitamin (MULTIVITAMIN WITH MINERALS) TABS tablet Take 1 tablet by mouth daily.     Omega-3 Fatty Acids (FISH OIL) 1000 MG CAPS Take 1,000 mg by mouth daily.     sildenafil (VIAGRA) 100 MG tablet Take 100 mg by mouth as needed for erectile dysfunction.      tamsulosin (FLOMAX) 0.4 MG CAPS capsule Take 0.4 mg by mouth in the morning and at bedtime.     tiotropium (SPIRIVA) 18 MCG inhalation capsule Place 18 mcg into inhaler and inhale daily.     vitamin C (ASCORBIC ACID)  500 MG tablet Take 500 mg by mouth daily.     omeprazole (PRILOSEC) 20 MG capsule Take 20 mg by mouth daily.      No current facility-administered medications for this visit.    Physical Exam BP 131/78 (BP Location: Left Arm, Patient Position: Sitting, Cuff Size: Normal)   Pulse 75   Resp 20   Ht '5\' 7"'$  (1.702 m)   Wt 177 lb (80.3 kg)   SpO2 93% Comment: RA  BMI 27.59 kg/m  82 year old man in no acute distress Alert and oriented x 3 with no focal deficits Lungs diminished breath sounds bilaterally no wheezing Cardiac regular rate and rhythm no rub or murmur No peripheral edema  Diagnostic Tests: CT ANGIOGRAPHY CHEST WITH CONTRAST   TECHNIQUE: Multidetector CT imaging of the chest was performed using the standard protocol during bolus administration of intravenous contrast. Multiplanar CT image reconstructions and  MIPs were obtained to evaluate the vascular anatomy.   RADIATION DOSE REDUCTION: This exam was performed according to the departmental dose-optimization program which includes automated exposure control, adjustment of the mA and/or kV according to patient size and/or use of iterative reconstruction technique.   CONTRAST:  8m ISOVUE-370 IOPAMIDOL (ISOVUE-370) INJECTION 76%   COMPARISON:  03/09/2022   FINDINGS: Cardiovascular: Preferential opacification of the thoracic aorta. Unchanged enlargement of the tubular ascending thoracic aorta measuring up to 4.6 x 4.6 cm. The aortic valve measures up to 2.6 cm in caliber. The sinuses of Valsalva measure up to 3.8 cm. Descending thoracic aorta measures up to 3.3 x 3.2 cm distally. Severe mixed calcific atherosclerosis. Normal heart size. Three-vessel coronary artery calcifications. No pericardial effusion.   Mediastinum/Nodes: No enlarged mediastinal, hilar, or axillary lymph nodes. Thyroid gland, trachea, and esophagus demonstrate no significant findings.   Lungs/Pleura: Mild centrilobular and paraseptal emphysema. Diffuse bilateral bronchial wall thickening. Background of very fine centrilobular pulmonary nodules, most concentrated in the lung apices. No pleural effusion or pneumothorax.   Upper Abdomen: No acute abnormality. Simple, benign liver cysts or hemangiomata, for which no further follow-up or characterization is required.   Musculoskeletal: No chest wall abnormality. No acute osseous findings.   Review of the MIP images confirms the above findings.   IMPRESSION: 1. Unchanged enlargement of the tubular ascending thoracic aorta measuring up to 4.6 x 4.6 cm. Descending thoracic aorta measures up to 3.3 x 3.2 cm distally. Ascending thoracic aortic aneurysm. Recommend semi-annual imaging followup by CTA or MRA and referral to cardiothoracic surgery if not already obtained. This recommendation follows 2010  ACCF/AHA/AATS/ACR/ASA/SCA/SCAI/SIR/STS/SVM Guidelines for the Diagnosis and Management of Patients With Thoracic Aortic Disease. Circulation. 2010; 121:: Q330-Q762 Aortic aneurysm NOS (ICD10-I71.9) 2. Severe mixed atherosclerosis. 3. Emphysema and diffuse bilateral bronchial wall thickening. Background of very fine centrilobular pulmonary nodules, most concentrated in the lung apices, most commonly seen in smoking-related respiratory bronchiolitis. 4. Coronary artery disease.   Emphysema (ICD10-J43.9).     Electronically Signed   By: ADelanna AhmadiM.D.   On: 11/09/2022 10:53 I personally reviewed the CT images.  There is a 4.6 cm ascending aneurysm.  Coronary atherosclerosis.  Thoracic aortic atherosclerosis.  Tortuous but not aneurysmal descending aorta.  Emphysema.  Impression: AFilip Reyes is an 82year old man with a past history significant for an ascending thoracic aortic aneurysm, thoracic aortic atherosclerosis, tobacco abuse, COPD, hypertension, hyperlipidemia, congestive heart failure, reflux, Barrett's esophagus, prediabetes, psoriasis, anxiety and depression.  Ascending thoracic aneurysm/thoracic aortic atherosclerosis-aneurysm measuring 4.6 cm.  No indication for surgery.  I reviewed the  indications for surgery with Mr. and Mrs. Shartzer, which would include a size greater than 5.5 cm or an increase in 5 mm in 6 months.  Needs continued semiannual follow-up.  Importance of tobacco cessation and blood pressure control were emphasized.  Coronary and thoracic atherosclerosis-on fish oil and aspirin.  Hypertension-blood pressure well-controlled on Coreg and Entresto.  Tobacco abuse-continues to smoke about a pack of cigarettes daily.  Emphasized the importance of tobacco cessation for his cardiovascular and respiratory health.  Plan: Quit smoking Return in 6 months with CT angio of chest  Melrose Nakayama, MD [facility] [phone]

## 2022-11-28 ENCOUNTER — Telehealth: Payer: Self-pay | Admitting: Urology

## 2022-11-28 DIAGNOSIS — C61 Malignant neoplasm of prostate: Secondary | ICD-10-CM

## 2022-11-28 NOTE — Telephone Encounter (Signed)
I contacted Mr. Mittleman to discuss his prostate biopsy report.  He had no postbiopsy complaints.  Prostate volume was 32.7 g he underwent a total of 16 biopsies (standard 12 core +2 biopsies ROI x 2).  ROI 1 with 2/2 cores positive for Gleason 4+5 adenocarcinoma (80%, 60%).  ROI to with 1/2 cores Gleason 4+4 adenocarcinoma (20%).  5/6 right-sided cores of the template biopsies showed Gleason 4+3/4+4/4+5.  Refer to path report for details  We discussed his NCCN risk stratification as very high.  His estimated life expectancy is >5 years and treatment is recommended.  Based on his age would recommend radiation + ADT.  Will schedule PSMA/PET.  Order placed along with a radiation oncology referral.  He is interested in pursuing treatment

## 2022-12-13 ENCOUNTER — Ambulatory Visit
Admission: RE | Admit: 2022-12-13 | Discharge: 2022-12-13 | Disposition: A | Discharge status: Home / Self Care | Payer: Medicare Other | Source: Ambulatory Visit | Attending: Urology | Admitting: Urology

## 2022-12-13 DIAGNOSIS — C61 Malignant neoplasm of prostate: Secondary | ICD-10-CM | POA: Diagnosis not present

## 2022-12-13 MED ORDER — PIFLIFOLASTAT F 18 (PYLARIFY) INJECTION
9.0000 | Freq: Once | INTRAVENOUS | Status: AC
  Administered 2022-12-13: 9.39 via INTRAVENOUS

## 2022-12-14 ENCOUNTER — Encounter: Payer: Self-pay | Admitting: Radiation Oncology

## 2022-12-14 ENCOUNTER — Ambulatory Visit
Admission: RE | Admit: 2022-12-14 | Discharge: 2022-12-14 | Disposition: A | Discharge status: Home / Self Care | Payer: Medicare Other | Source: Ambulatory Visit | Attending: Radiation Oncology | Admitting: Radiation Oncology

## 2022-12-14 VITALS — BP 131/89 | HR 63 | Temp 96.4°F | Resp 16 | Ht 67.0 in | Wt 178.4 lb

## 2022-12-14 DIAGNOSIS — F17210 Nicotine dependence, cigarettes, uncomplicated: Secondary | ICD-10-CM | POA: Diagnosis not present

## 2022-12-14 DIAGNOSIS — E785 Hyperlipidemia, unspecified: Secondary | ICD-10-CM | POA: Diagnosis not present

## 2022-12-14 DIAGNOSIS — I420 Dilated cardiomyopathy: Secondary | ICD-10-CM | POA: Insufficient documentation

## 2022-12-14 DIAGNOSIS — N400 Enlarged prostate without lower urinary tract symptoms: Secondary | ICD-10-CM | POA: Diagnosis not present

## 2022-12-14 DIAGNOSIS — I110 Hypertensive heart disease with heart failure: Secondary | ICD-10-CM | POA: Diagnosis not present

## 2022-12-14 DIAGNOSIS — Z7951 Long term (current) use of inhaled steroids: Secondary | ICD-10-CM | POA: Insufficient documentation

## 2022-12-14 DIAGNOSIS — C61 Malignant neoplasm of prostate: Secondary | ICD-10-CM | POA: Insufficient documentation

## 2022-12-14 DIAGNOSIS — Z7982 Long term (current) use of aspirin: Secondary | ICD-10-CM | POA: Diagnosis not present

## 2022-12-14 DIAGNOSIS — Z79899 Other long term (current) drug therapy: Secondary | ICD-10-CM | POA: Insufficient documentation

## 2022-12-14 DIAGNOSIS — K219 Gastro-esophageal reflux disease without esophagitis: Secondary | ICD-10-CM | POA: Diagnosis not present

## 2022-12-14 DIAGNOSIS — I5022 Chronic systolic (congestive) heart failure: Secondary | ICD-10-CM | POA: Insufficient documentation

## 2022-12-14 DIAGNOSIS — J431 Panlobular emphysema: Secondary | ICD-10-CM | POA: Diagnosis not present

## 2022-12-14 DIAGNOSIS — Z8719 Personal history of other diseases of the digestive system: Secondary | ICD-10-CM | POA: Insufficient documentation

## 2022-12-14 NOTE — Consult Note (Signed)
NEW PATIENT EVALUATION  Name: Mathew Reyes  MRN: [id number]  Date:   12/14/2022     DOB: [birth year]   This 82 y.o. 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  REFERRING PHYSICIAN: Idelle Crouch, MD  CHIEF COMPLAINT:  Chief Complaint  Patient presents with   Prostate Cancer    consult    DIAGNOSIS: The encounter diagnosis was Malignant neoplasm of prostate ([place]).   PREVIOUS INVESTIGATIONS:  PSMA PET scan and MRI scan reviewed Clinical notes reviewed Pathology reports reviewed  HPI: Patient is a 82 year old male who presented with an elevated PSA eventually reaching 9.6.  This prompted an MRI of his prostate showing a PI-RADS category 5 lesion in the right peripheral zone and PI-RADS 4 lesion of the left peripheral zone.  Patient underwent fusion biopsy which was positive for 8 of 16 cores positive for mixture of Gleason 9 (4+5) Gleason 8 (4+4) and Gleason 7 adenocarcinoma.  He underwent a PSMA PET scan which was performed yesterday showing no evidence of extraprostatic spread pelvic lymphadenopathy or metastatic disease.  There was hypermetabolic activity present in the right gland.  Treatment options have been discussed with the patient.  He is now referred to radiation oncology for consideration of treatment.  He is fairly asymptomatic specifically denies urgency frequency or any other lower urinary tract symptoms.  He is having no bone pain.  PLANNED TREATMENT REGIMEN: Image guided IMRT radiation therapy to prostate and pelvic nodes plus ADT therapy  PAST MEDICAL HISTORY:  has a past medical history of Abdominal hernia, Anxiety, Arthritis, Barrett's esophagus, BPH (benign prostatic hyperplasia), COPD (chronic obstructive pulmonary disease) ([place]), DCM (dilated cardiomyopathy) ([place]), DDD (degenerative disc disease), lumbar, Depression, DOE (dyspnea on exertion),  Erectile dysfunction, GERD (gastroesophageal reflux disease), HFrEF (heart failure with reduced ejection fraction) ([place]), HLD (hyperlipidemia), Hypertension, Panlobular emphysema ([place]), Prediabetes, Psoriasis, Situational anxiety, SOB (shortness of breath), and Tobacco abuse.    PAST SURGICAL HISTORY:  Past Surgical History:  Procedure Laterality Date   APPENDECTOMY     CARPAL TUNNEL RELEASE Left 05/04/2021   Procedure: CARPAL TUNNEL RELEASE;  Surgeon: Dereck Leep, MD;  Location: [facility] ORS;  Service: Orthopedics;  Laterality: Left;   COLONOSCOPY, ESOPHAGOGASTRODUODENOSCOPY (EGD) AND ESOPHAGEAL DILATION     ESOPHAGOGASTRODUODENOSCOPY N/A 03/20/2015   Procedure: ESOPHAGOGASTRODUODENOSCOPY (EGD);  Surgeon: Hulen Luster, MD;  Location: [facility] ENDOSCOPY;  Service: Gastroenterology;  Laterality: N/A;   ESOPHAGOGASTRODUODENOSCOPY N/A 08/28/2015   Procedure: ESOPHAGOGASTRODUODENOSCOPY (EGD);  Surgeon: Hulen Luster, MD;  Location: [facility] ENDOSCOPY;  Service: Gastroenterology;  Laterality: N/A;   ESOPHAGOGASTRODUODENOSCOPY N/A 11/27/2015   Procedure: ESOPHAGOGASTRODUODENOSCOPY (EGD);  Surgeon: Hulen Luster, MD;  Location: [facility] ENDOSCOPY;  Service: Gastroenterology;  Laterality: N/A;   Hemmorrhoidectomy     HERNIA REPAIR     JOINT REPLACEMENT     Throid polyp     TOTAL HIP ARTHROPLASTY Left 04/14/2020   Procedure: TOTAL HIP ARTHROPLASTY;  Surgeon: Dereck Leep, MD;  Location: [facility] ORS;  Service: Orthopedics;  Laterality: Left;    FAMILY HISTORY: family history includes Alzheimer's disease in his mother; Aortic aneurysm in his father.  SOCIAL HISTORY:  reports that he has been smoking cigarettes. He has a 60.00 pack-year smoking history. He has never used smokeless tobacco. He reports current alcohol use. He reports that he does not use drugs.  ALLERGIES: Patient has no known allergies.  MEDICATIONS:  Current Outpatient Medications  Medication Sig Dispense Refill   doxazosin (CARDURA) 2 MG tablet Take 2  mg by mouth daily.     rosuvastatin (CRESTOR) 10 MG tablet Take 10 mg by mouth at bedtime.     aspirin EC 81 MG tablet Take 81 mg by mouth daily. Swallow whole.     budesonide-formoterol (SYMBICORT) 160-4.5 MCG/ACT inhaler Inhale 2 puffs into the lungs 2 (two) times daily.     calcipotriene-betamethasone (TACLONEX) ointment Apply 1 application topically in the morning and at bedtime.     calcium carbonate (OSCAL) 1500 (600 Ca) MG TABS tablet Take 600 mg of elemental calcium by mouth daily with breakfast.     carvedilol (COREG) 3.125 MG tablet Take 3.125 mg by mouth 2 (two) times daily.      digoxin (LANOXIN) 0.125 MG tablet Take 0.125 mg by mouth daily.   0   ENTRESTO 24-26 MG Take 1 tablet by mouth daily.     finasteride (PROSCAR) 5 MG tablet Take 1 tablet (5 mg total) by mouth daily. 90 tablet 4   FLUoxetine (PROZAC) 20 MG capsule Take 20 mg by mouth daily.     levalbuterol (XOPENEX HFA) 45 MCG/ACT inhaler Inhale 1-2 puffs into the lungs in the morning and at bedtime.     montelukast (SINGULAIR) 10 MG tablet Take 10 mg by mouth at bedtime.      Multiple Vitamin (MULTIVITAMIN WITH MINERALS) TABS tablet Take 1 tablet by mouth daily.     Omega-3 Fatty Acids (FISH OIL) 1000 MG CAPS Take 1,000 mg by mouth daily.     omeprazole (PRILOSEC) 20 MG capsule Take 20 mg by mouth daily.      sildenafil (VIAGRA) 100 MG tablet Take 100 mg by mouth as needed for erectile dysfunction.      tamsulosin (FLOMAX) 0.4 MG CAPS capsule Take 0.4 mg by mouth in the morning and at bedtime.     tiotropium (SPIRIVA) 18 MCG inhalation capsule Place 18 mcg into inhaler and inhale daily.     vitamin C (ASCORBIC ACID) 500 MG tablet Take 500 mg by mouth daily.     No current facility-administered medications for this encounter.    ECOG PERFORMANCE STATUS:  0 - Asymptomatic  REVIEW OF SYSTEMS: Patient denies any weight loss, fatigue, weakness, fever, chills or night sweats. Patient denies any loss of vision, blurred  vision. Patient denies any ringing  of the ears or hearing loss. No irregular heartbeat. Patient denies heart murmur or history of fainting. Patient denies any chest pain or pain radiating to her upper extremities. Patient denies any shortness of breath, difficulty breathing at night, cough or hemoptysis. Patient denies any swelling in the lower legs. Patient denies any nausea vomiting, vomiting of blood, or coffee ground material in the vomitus. Patient denies any stomach pain. Patient states has had normal bowel movements no significant constipation or diarrhea. Patient denies any dysuria, hematuria or significant nocturia. Patient denies any problems walking, swelling in the joints or loss of balance. Patient denies any skin changes, loss of hair or loss of weight. Patient denies any excessive worrying or anxiety or significant depression. Patient denies any problems with insomnia. Patient denies excessive thirst, polyuria, polydipsia. Patient denies any swollen glands, patient denies easy bruising or easy bleeding. Patient denies any recent infections, allergies or URI. Patient "s visual fields have not changed significantly in recent time.   PHYSICAL EXAM: BP 131/89   Pulse 63   Temp (!) 96.4 F (35.8 C)  Resp 16   Ht '5\' 7"'$  (1.702 m)   Wt 178 lb 6.4 oz (80.9 kg)   BMI 27.94 kg/m  Well-developed well-nourished patient in NAD. HEENT reveals PERLA, EOMI, discs not visualized.  Oral cavity is clear. No oral mucosal lesions are identified. Neck is clear without evidence of cervical or supraclavicular adenopathy. Lungs are clear to A&P. Cardiac examination is essentially unremarkable with regular rate and rhythm without murmur rub or thrill. Abdomen is benign with no organomegaly or masses noted. Motor sensory and DTR levels are equal and symmetric in the upper and lower extremities. Cranial nerves II through XII are grossly intact. Proprioception is intact. No peripheral adenopathy or edema is  identified. No motor or sensory levels are noted. Crude visual fields are within normal range.  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  Based on his age I would recommend IMRT image guided radiation therapy to his prostate and pelvic nodes.  Will treat up to 80 Gray to his PET positive area of the prostate the remaining gland would receive 80 Gray over 8 weeks and his pelvic lymph nodes received 45 Gray.  Risks and benefits of treatment including increased lower urinary tract symptoms diarrhea fatigue alteration blood counts possible skin reaction all were described in detail to the patient and his wife.  I have asked Dr. Bernardo Heater to place fiducial markers in his prostate for daily image guided treatment.  I have also asked him to start him on ADT therapy with [place].  Patient comprehends my recommendations well.  We will schedule simulation after his markers are placed.  I would like to take this opportunity to thank you for allowing me to participate in the care of your patient.Noreene Filbert, MD

## 2022-12-16 ENCOUNTER — Telehealth: Payer: Self-pay | Admitting: Urology

## 2022-12-16 NOTE — Telephone Encounter (Signed)
Pt's wife called and said the cancer center had sent a message to [place] about getting pt in as soon as possible to have something inserted into his bottom.  She said Mondays or Tuesdays work best for them and he needs appt prior to starting radiation.

## 2022-12-17 ENCOUNTER — Telehealth: Payer: Self-pay | Admitting: *Deleted

## 2022-12-17 NOTE — Telephone Encounter (Signed)
Advised patient that we will call them to set up appt. After we get medication Eligard approved.

## 2022-12-17 NOTE — Telephone Encounter (Signed)
Patient s/o called statiog that sehe tried calling Dr Dagoberto Reef office regarding the injection patient needs and they said that we have not sent a request to them yet for it. Please return her call

## 2022-12-27 NOTE — Telephone Encounter (Signed)
Pt wife calling wanting to set up an appt for the Eligard appt. Please Verlin Grills [phone] when it has been approved.

## 2022-12-27 NOTE — Telephone Encounter (Signed)
Called [employer] start PA request for Eligard.   They closed early today. Lala aware. Will try tomorrow.

## 2022-12-29 ENCOUNTER — Telehealth: Payer: Self-pay | Admitting: Urology

## 2022-12-29 NOTE — Telephone Encounter (Signed)
Pt's wife LMOM to let you know he has appt w/[facility].  She said she s/w you on 2/21.

## 2022-12-30 ENCOUNTER — Telehealth: Payer: Self-pay | Admitting: Urology

## 2022-12-30 NOTE — Telephone Encounter (Signed)
See previous telephone encounter.

## 2022-12-30 NOTE — Telephone Encounter (Signed)
Patient's wife called and lvm for [place] stating that patient has an appt today at 2 pm with Dr. Clayborn Bigness, and she asked if she gets the referral from him today; can she bring it over to our office so we "can get things going". She requests a call back at [phone]

## 2022-12-30 NOTE — Telephone Encounter (Signed)
No Pa required for supplement.  Ref#- [id number].   No Pa required for regular medicaid.  Appt made for 3/20 at 245. Instructions mailed to pts home. Lala aware.   Cancer center aware via staff message.

## 2023-01-03 ENCOUNTER — Telehealth: Payer: Self-pay

## 2023-01-03 NOTE — Telephone Encounter (Signed)
-----   Message from [place], [place] sent at 12/28/2022  1:58 PM EST ----- Regarding: RE: Eligard and Markers Placed Contacted pts [employer] supplement.  S/W Trilby Drummer. No pa required.   Ref# [id number]  Faxed Cardiac clearance to Dr. Clayborn Bigness at [facility]. Pts spouse -Alta Corning aware once clearance is received I will schedule appt.   CM ----- Message ----- From: Gordy Clement, [place] Sent: 12/21/2022   2:02 PM EST To: Christean Grief, RN; Joyice Faster, CMA Subject: RE: Eligard and Markers Placed                 Got it! Thank you, unfortunately I am the only person doing Eligard PAs and I have been working a provider alone most days so I am behind on authorizations, I will work on this as soon as I can. Thanks.  Best,  [place]   ----- Message ----- From: Christean Grief, RN Sent: 12/17/2022   9:53 AM EST To: Joyice Faster, CMA; Gordy Clement, CMA Subject: Eligard and Markers Placed                     Happy Friday!  Dr. Baruch Gouty is requesting this patient to receive Eligard injection and have Markers placed.  I apologize if you did not receive a message earlier this week.  He has the markers with him and has been instructed to bring to his appointment. He is a patient of Dr. Bernardo Heater.  Thank you, Ranelle Oyster, Radiation Oncology

## 2023-01-10 NOTE — Telephone Encounter (Signed)
Called pt to inform him ok to d/c ASA 5 days prior to gold marker placement. Patient states that he has discontinued ASA on his own and will resume after procedure. Pt advised that medical recommendation is only 5 days. Pt voiced understanding.

## 2023-01-26 ENCOUNTER — Encounter: Payer: Self-pay | Admitting: Urology

## 2023-01-26 ENCOUNTER — Ambulatory Visit (INDEPENDENT_AMBULATORY_CARE_PROVIDER_SITE_OTHER): Payer: Medicare Other | Admitting: Urology

## 2023-01-26 DIAGNOSIS — C61 Malignant neoplasm of prostate: Secondary | ICD-10-CM | POA: Diagnosis not present

## 2023-01-26 MED ORDER — LEUPROLIDE ACETATE (6 MONTH) 45 MG ~~LOC~~ KIT
45.0000 mg | PACK | Freq: Once | SUBCUTANEOUS | Status: AC
  Administered 2023-01-26: 45 mg via SUBCUTANEOUS

## 2023-01-26 MED ORDER — GENTAMICIN SULFATE 40 MG/ML IJ SOLN: 80.0000 mg | Freq: Once | INTRAMUSCULAR | Status: DC

## 2023-01-26 MED ORDER — LEVOFLOXACIN 500 MG PO TABS: 500.0000 mg | ORAL_TABLET | Freq: Once | ORAL | Status: DC

## 2023-01-26 NOTE — Progress Notes (Signed)
Eligard SubQ Injection   Due to Prostate Cancer patient is present today for a Eligard Injection.  Medication: Eligard 6 month Dose: 45 mg  Location: left  Lot: [id number] Exp: 12/2023 12/2023 Patient tolerated well, no complications were noted  Performed by: Gaspar Cola CMA  Per Dr. Bernardo Heater patient is to continue therapy for  . reminder continue on Vitamin D 800-1000iu and Calcium 1000-1200mg  daily while on Androgen Deprivation Therapy.  PA approval dates:

## 2023-01-26 NOTE — Patient Instructions (Signed)
Vitamin D 800-1000iu and Calcium 1000-1200mg daily while on Androgen Deprivation Therapy.  

## 2023-01-27 ENCOUNTER — Telehealth: Payer: Self-pay | Admitting: Urology

## 2023-01-27 NOTE — Telephone Encounter (Signed)
Pt's wife called asking if probe for gold seed markers was fixed yet.  She also wanted to know if we talked to kidney dr.  She would like to speak with someone.

## 2023-01-27 NOTE — Telephone Encounter (Signed)
Left message and advised patient wife that the probe is still not here. We will call when we can reschedule.

## 2023-01-31 ENCOUNTER — Ambulatory Visit: Payer: Medicare Other | Admitting: Radiation Oncology

## 2023-01-31 ENCOUNTER — Ambulatory Visit: Payer: Medicare Other

## 2023-02-02 ENCOUNTER — Telehealth: Payer: Self-pay | Admitting: Urology

## 2023-02-02 NOTE — Telephone Encounter (Signed)
Patient wife called in today and states he husband left eye is swollen and there is a knot on his forehead with a penile line going up .to his hair line. The knot is painful to touch. Advised patient to call his PCP to see about getting in this late. If not to go to the ER to get evaluation .

## 2023-02-02 NOTE — Telephone Encounter (Signed)
Lala called, since pt took the testosterone injection he has had swelling over his left eye.  Had no problem before the shot.  Is this a side effect? [phone].

## 2023-02-07 ENCOUNTER — Ambulatory Visit: Payer: Medicare Other

## 2023-02-09 ENCOUNTER — Encounter: Payer: Self-pay | Admitting: Urology

## 2023-02-09 ENCOUNTER — Ambulatory Visit (INDEPENDENT_AMBULATORY_CARE_PROVIDER_SITE_OTHER): Payer: Medicare Other | Admitting: Urology

## 2023-02-09 VITALS — BP 146/86 | HR 78 | Ht 67.0 in | Wt 175.0 lb

## 2023-02-09 DIAGNOSIS — C61 Malignant neoplasm of prostate: Secondary | ICD-10-CM | POA: Diagnosis not present

## 2023-02-09 MED ORDER — LEVOFLOXACIN 500 MG PO TABS
500.0000 mg | ORAL_TABLET | Freq: Once | ORAL | Status: AC
  Administered 2023-02-09: 500 mg via ORAL

## 2023-02-09 MED ORDER — GENTAMICIN SULFATE 40 MG/ML IJ SOLN
80.0000 mg | Freq: Once | INTRAMUSCULAR | Status: AC
  Administered 2023-02-09: 80 mg via INTRAMUSCULAR

## 2023-02-09 NOTE — Progress Notes (Signed)
02/09/23  CC: gold fiducial marker placement  HPI: 82 y.o. male with prostate cancer who presents today for placement of fiducial seed markers in anticipation of his upcoming IMRT with Dr. Baruch Gouty.  Prostate Gold fiducial Marker Placement Procedure   Informed consent was obtained after discussing risks/benefits of the procedure.  A time out was performed to ensure correct patient identity.  Pre-Procedure: - Gentamicin given prophylactically - PO Levaquin 500 mg also given today  Procedure: - Lidocaine jelly was administered per rectum - Rectal ultrasound probe was placed without difficulty and the prostate visualized - Prostatic block performed with 10 mL 1% Xylocaine - 3 fiducial gold seed markers placed, one at right base, one at left base, one at apex of prostate gland under transrectal ultrasound guidance  Post-Procedure: - Patient tolerated the procedure well - He was counseled to seek immediate medical attention if experiences any severe pain, significant bleeding, or fevers    John Giovanni, MD

## 2023-02-10 ENCOUNTER — Ambulatory Visit: Payer: Medicare Other

## 2023-02-14 ENCOUNTER — Ambulatory Visit
Admission: RE | Admit: 2023-02-14 | Discharge: 2023-02-14 | Disposition: A | Discharge status: Home / Self Care | Payer: Medicare Other | Source: Ambulatory Visit | Attending: Radiation Oncology | Admitting: Radiation Oncology

## 2023-02-14 DIAGNOSIS — J431 Panlobular emphysema: Secondary | ICD-10-CM | POA: Diagnosis not present

## 2023-02-14 DIAGNOSIS — I420 Dilated cardiomyopathy: Secondary | ICD-10-CM | POA: Insufficient documentation

## 2023-02-14 DIAGNOSIS — Z79899 Other long term (current) drug therapy: Secondary | ICD-10-CM | POA: Diagnosis not present

## 2023-02-14 DIAGNOSIS — I110 Hypertensive heart disease with heart failure: Secondary | ICD-10-CM | POA: Insufficient documentation

## 2023-02-14 DIAGNOSIS — K219 Gastro-esophageal reflux disease without esophagitis: Secondary | ICD-10-CM | POA: Insufficient documentation

## 2023-02-14 DIAGNOSIS — N400 Enlarged prostate without lower urinary tract symptoms: Secondary | ICD-10-CM | POA: Diagnosis not present

## 2023-02-14 DIAGNOSIS — F17210 Nicotine dependence, cigarettes, uncomplicated: Secondary | ICD-10-CM | POA: Insufficient documentation

## 2023-02-14 DIAGNOSIS — Z7982 Long term (current) use of aspirin: Secondary | ICD-10-CM | POA: Diagnosis not present

## 2023-02-14 DIAGNOSIS — C61 Malignant neoplasm of prostate: Secondary | ICD-10-CM | POA: Diagnosis present

## 2023-02-14 DIAGNOSIS — I5022 Chronic systolic (congestive) heart failure: Secondary | ICD-10-CM | POA: Insufficient documentation

## 2023-02-14 DIAGNOSIS — Z8719 Personal history of other diseases of the digestive system: Secondary | ICD-10-CM | POA: Diagnosis not present

## 2023-02-14 DIAGNOSIS — E785 Hyperlipidemia, unspecified: Secondary | ICD-10-CM | POA: Diagnosis not present

## 2023-02-14 DIAGNOSIS — Z7951 Long term (current) use of inhaled steroids: Secondary | ICD-10-CM | POA: Insufficient documentation

## 2023-02-17 DIAGNOSIS — C61 Malignant neoplasm of prostate: Secondary | ICD-10-CM | POA: Diagnosis not present

## 2023-02-18 ENCOUNTER — Other Ambulatory Visit: Payer: Self-pay | Admitting: *Deleted

## 2023-02-18 DIAGNOSIS — C61 Malignant neoplasm of prostate: Secondary | ICD-10-CM

## 2023-02-21 ENCOUNTER — Ambulatory Visit: Admission: RE | Admit: 2023-02-21 | Payer: Medicare Other | Source: Ambulatory Visit

## 2023-02-22 ENCOUNTER — Other Ambulatory Visit: Payer: Self-pay

## 2023-02-22 ENCOUNTER — Ambulatory Visit
Admission: RE | Admit: 2023-02-22 | Discharge: 2023-02-22 | Disposition: A | Discharge status: Home / Self Care | Payer: Medicare Other | Source: Ambulatory Visit | Attending: Radiation Oncology | Admitting: Radiation Oncology

## 2023-02-22 DIAGNOSIS — C61 Malignant neoplasm of prostate: Secondary | ICD-10-CM | POA: Diagnosis not present

## 2023-02-22 LAB — RAD ONC ARIA SESSION SUMMARY
Course Elapsed Days: 0
Plan Fractions Treated to Date: 1
Plan Prescribed Dose Per Fraction: 2.075 Gy
Plan Total Fractions Prescribed: 40
Plan Total Prescribed Dose: 83 Gy
Reference Point Dosage Given to Date: 2.075 Gy
Reference Point Session Dosage Given: 2.075 Gy
Session Number: 1

## 2023-02-23 ENCOUNTER — Ambulatory Visit
Admission: RE | Admit: 2023-02-23 | Discharge: 2023-02-23 | Disposition: A | Discharge status: Home / Self Care | Payer: Medicare Other | Source: Ambulatory Visit | Attending: Radiation Oncology | Admitting: Radiation Oncology

## 2023-02-23 ENCOUNTER — Other Ambulatory Visit: Payer: Self-pay

## 2023-02-23 DIAGNOSIS — C61 Malignant neoplasm of prostate: Secondary | ICD-10-CM | POA: Diagnosis not present

## 2023-02-23 LAB — RAD ONC ARIA SESSION SUMMARY
Course Elapsed Days: 1
Plan Fractions Treated to Date: 2
Plan Prescribed Dose Per Fraction: 2.075 Gy
Plan Total Fractions Prescribed: 40
Plan Total Prescribed Dose: 83 Gy
Reference Point Dosage Given to Date: 4.15 Gy
Reference Point Session Dosage Given: 2.075 Gy
Session Number: 2

## 2023-02-24 ENCOUNTER — Ambulatory Visit
Admission: RE | Admit: 2023-02-24 | Discharge: 2023-02-24 | Disposition: A | Discharge status: Home / Self Care | Payer: Medicare Other | Source: Ambulatory Visit | Attending: Radiation Oncology | Admitting: Radiation Oncology

## 2023-02-24 ENCOUNTER — Other Ambulatory Visit: Payer: Self-pay

## 2023-02-24 DIAGNOSIS — C61 Malignant neoplasm of prostate: Secondary | ICD-10-CM | POA: Diagnosis not present

## 2023-02-24 LAB — RAD ONC ARIA SESSION SUMMARY
Course Elapsed Days: 2
Plan Fractions Treated to Date: 3
Plan Prescribed Dose Per Fraction: 2.075 Gy
Plan Total Fractions Prescribed: 40
Plan Total Prescribed Dose: 83 Gy
Reference Point Dosage Given to Date: 6.225 Gy
Reference Point Session Dosage Given: 2.075 Gy
Session Number: 3

## 2023-02-25 ENCOUNTER — Other Ambulatory Visit: Payer: Self-pay

## 2023-02-25 ENCOUNTER — Ambulatory Visit
Admission: RE | Admit: 2023-02-25 | Discharge: 2023-02-25 | Disposition: A | Discharge status: Home / Self Care | Payer: Medicare Other | Source: Ambulatory Visit | Attending: Radiation Oncology | Admitting: Radiation Oncology

## 2023-02-25 DIAGNOSIS — C61 Malignant neoplasm of prostate: Secondary | ICD-10-CM | POA: Diagnosis not present

## 2023-02-25 LAB — RAD ONC ARIA SESSION SUMMARY
Course Elapsed Days: 3
Plan Fractions Treated to Date: 4
Plan Prescribed Dose Per Fraction: 2.075 Gy
Plan Total Fractions Prescribed: 40
Plan Total Prescribed Dose: 83 Gy
Reference Point Dosage Given to Date: 8.3 Gy
Reference Point Session Dosage Given: 2.075 Gy
Session Number: 4

## 2023-02-28 ENCOUNTER — Ambulatory Visit
Admission: RE | Admit: 2023-02-28 | Discharge: 2023-02-28 | Disposition: A | Discharge status: Home / Self Care | Payer: Medicare Other | Source: Ambulatory Visit | Attending: Radiation Oncology | Admitting: Radiation Oncology

## 2023-02-28 ENCOUNTER — Other Ambulatory Visit: Payer: Self-pay

## 2023-02-28 ENCOUNTER — Inpatient Hospital Stay: Payer: Medicare Other | Attending: Radiation Oncology

## 2023-02-28 DIAGNOSIS — C61 Malignant neoplasm of prostate: Secondary | ICD-10-CM | POA: Diagnosis not present

## 2023-02-28 LAB — RAD ONC ARIA SESSION SUMMARY
Course Elapsed Days: 6
Plan Fractions Treated to Date: 5
Plan Prescribed Dose Per Fraction: 2.075 Gy
Plan Total Fractions Prescribed: 40
Plan Total Prescribed Dose: 83 Gy
Reference Point Dosage Given to Date: 10.375 Gy
Reference Point Session Dosage Given: 2.075 Gy
Session Number: 5

## 2023-02-28 LAB — CBC (CANCER CENTER ONLY)
HCT: 41.6 % (ref 39.0–52.0)
Hemoglobin: 13.8 g/dL (ref 13.0–17.0)
MCH: 30.8 pg (ref 26.0–34.0)
MCHC: 33.2 g/dL (ref 30.0–36.0)
MCV: 92.9 fL (ref 80.0–100.0)
Platelet Count: 177 10*3/uL (ref 150–400)
RBC: 4.48 MIL/uL (ref 4.22–5.81)
RDW: 12.9 % (ref 11.5–15.5)
WBC Count: 7.4 10*3/uL (ref 4.0–10.5)
nRBC: 0 % (ref 0.0–0.2)

## 2023-03-01 ENCOUNTER — Other Ambulatory Visit: Payer: Self-pay

## 2023-03-01 ENCOUNTER — Ambulatory Visit
Admission: RE | Admit: 2023-03-01 | Discharge: 2023-03-01 | Disposition: A | Discharge status: Home / Self Care | Payer: Medicare Other | Source: Ambulatory Visit | Attending: Radiation Oncology | Admitting: Radiation Oncology

## 2023-03-01 DIAGNOSIS — C61 Malignant neoplasm of prostate: Secondary | ICD-10-CM | POA: Diagnosis not present

## 2023-03-01 LAB — RAD ONC ARIA SESSION SUMMARY
Course Elapsed Days: 7
Plan Fractions Treated to Date: 6
Plan Prescribed Dose Per Fraction: 2.075 Gy
Plan Total Fractions Prescribed: 40
Plan Total Prescribed Dose: 83 Gy
Reference Point Dosage Given to Date: 12.45 Gy
Reference Point Session Dosage Given: 2.075 Gy
Session Number: 6

## 2023-03-02 ENCOUNTER — Ambulatory Visit
Admission: RE | Admit: 2023-03-02 | Discharge: 2023-03-02 | Disposition: A | Discharge status: Home / Self Care | Payer: Medicare Other | Source: Ambulatory Visit | Attending: Radiation Oncology | Admitting: Radiation Oncology

## 2023-03-02 ENCOUNTER — Other Ambulatory Visit: Payer: Self-pay

## 2023-03-02 DIAGNOSIS — C61 Malignant neoplasm of prostate: Secondary | ICD-10-CM | POA: Diagnosis not present

## 2023-03-02 LAB — RAD ONC ARIA SESSION SUMMARY
Course Elapsed Days: 8
Plan Fractions Treated to Date: 7
Plan Prescribed Dose Per Fraction: 2.075 Gy
Plan Total Fractions Prescribed: 40
Plan Total Prescribed Dose: 83 Gy
Reference Point Dosage Given to Date: 14.525 Gy
Reference Point Session Dosage Given: 2.075 Gy
Session Number: 7

## 2023-03-03 ENCOUNTER — Ambulatory Visit
Admission: RE | Admit: 2023-03-03 | Discharge: 2023-03-03 | Disposition: A | Discharge status: Home / Self Care | Payer: Medicare Other | Source: Ambulatory Visit | Attending: Radiation Oncology | Admitting: Radiation Oncology

## 2023-03-03 ENCOUNTER — Other Ambulatory Visit: Payer: Self-pay

## 2023-03-03 DIAGNOSIS — C61 Malignant neoplasm of prostate: Secondary | ICD-10-CM | POA: Diagnosis not present

## 2023-03-03 LAB — RAD ONC ARIA SESSION SUMMARY
Course Elapsed Days: 9
Plan Fractions Treated to Date: 8
Plan Prescribed Dose Per Fraction: 2.075 Gy
Plan Total Fractions Prescribed: 40
Plan Total Prescribed Dose: 83 Gy
Reference Point Dosage Given to Date: 16.6 Gy
Reference Point Session Dosage Given: 2.075 Gy
Session Number: 8

## 2023-03-04 ENCOUNTER — Ambulatory Visit
Admission: RE | Admit: 2023-03-04 | Discharge: 2023-03-04 | Disposition: A | Discharge status: Home / Self Care | Payer: Medicare Other | Source: Ambulatory Visit | Attending: Radiation Oncology | Admitting: Radiation Oncology

## 2023-03-04 ENCOUNTER — Other Ambulatory Visit: Payer: Self-pay

## 2023-03-04 DIAGNOSIS — C61 Malignant neoplasm of prostate: Secondary | ICD-10-CM | POA: Diagnosis not present

## 2023-03-04 LAB — RAD ONC ARIA SESSION SUMMARY
Course Elapsed Days: 10
Plan Fractions Treated to Date: 9
Plan Prescribed Dose Per Fraction: 2.075 Gy
Plan Total Fractions Prescribed: 40
Plan Total Prescribed Dose: 83 Gy
Reference Point Dosage Given to Date: 18.675 Gy
Reference Point Session Dosage Given: 2.075 Gy
Session Number: 9

## 2023-03-07 ENCOUNTER — Ambulatory Visit
Admission: RE | Admit: 2023-03-07 | Discharge: 2023-03-07 | Disposition: A | Discharge status: Home / Self Care | Payer: Medicare Other | Source: Ambulatory Visit | Attending: Radiation Oncology | Admitting: Radiation Oncology

## 2023-03-07 ENCOUNTER — Other Ambulatory Visit: Payer: Self-pay

## 2023-03-07 DIAGNOSIS — C61 Malignant neoplasm of prostate: Secondary | ICD-10-CM | POA: Diagnosis not present

## 2023-03-07 LAB — RAD ONC ARIA SESSION SUMMARY
Course Elapsed Days: 13
Plan Fractions Treated to Date: 10
Plan Prescribed Dose Per Fraction: 2.075 Gy
Plan Total Fractions Prescribed: 40
Plan Total Prescribed Dose: 83 Gy
Reference Point Dosage Given to Date: 20.75 Gy
Reference Point Session Dosage Given: 2.075 Gy
Session Number: 10

## 2023-03-08 ENCOUNTER — Ambulatory Visit
Admission: RE | Admit: 2023-03-08 | Discharge: 2023-03-08 | Disposition: A | Discharge status: Home / Self Care | Payer: Medicare Other | Source: Ambulatory Visit | Attending: Radiation Oncology | Admitting: Radiation Oncology

## 2023-03-08 ENCOUNTER — Other Ambulatory Visit: Payer: Self-pay

## 2023-03-08 DIAGNOSIS — C61 Malignant neoplasm of prostate: Secondary | ICD-10-CM | POA: Diagnosis not present

## 2023-03-08 LAB — RAD ONC ARIA SESSION SUMMARY
Course Elapsed Days: 14
Plan Fractions Treated to Date: 11
Plan Prescribed Dose Per Fraction: 2.075 Gy
Plan Total Fractions Prescribed: 40
Plan Total Prescribed Dose: 83 Gy
Reference Point Dosage Given to Date: 22.825 Gy
Reference Point Session Dosage Given: 2.075 Gy
Session Number: 11

## 2023-03-09 ENCOUNTER — Ambulatory Visit
Admission: RE | Admit: 2023-03-09 | Discharge: 2023-03-09 | Disposition: A | Discharge status: Home / Self Care | Payer: Medicare Other | Source: Ambulatory Visit | Attending: Radiation Oncology | Admitting: Radiation Oncology

## 2023-03-09 ENCOUNTER — Other Ambulatory Visit: Payer: Self-pay

## 2023-03-09 DIAGNOSIS — Z8719 Personal history of other diseases of the digestive system: Secondary | ICD-10-CM | POA: Insufficient documentation

## 2023-03-09 DIAGNOSIS — I110 Hypertensive heart disease with heart failure: Secondary | ICD-10-CM | POA: Insufficient documentation

## 2023-03-09 DIAGNOSIS — N400 Enlarged prostate without lower urinary tract symptoms: Secondary | ICD-10-CM | POA: Insufficient documentation

## 2023-03-09 DIAGNOSIS — C61 Malignant neoplasm of prostate: Secondary | ICD-10-CM | POA: Insufficient documentation

## 2023-03-09 DIAGNOSIS — K219 Gastro-esophageal reflux disease without esophagitis: Secondary | ICD-10-CM | POA: Diagnosis not present

## 2023-03-09 DIAGNOSIS — Z7951 Long term (current) use of inhaled steroids: Secondary | ICD-10-CM | POA: Insufficient documentation

## 2023-03-09 DIAGNOSIS — J431 Panlobular emphysema: Secondary | ICD-10-CM | POA: Insufficient documentation

## 2023-03-09 DIAGNOSIS — Z79899 Other long term (current) drug therapy: Secondary | ICD-10-CM | POA: Insufficient documentation

## 2023-03-09 DIAGNOSIS — I420 Dilated cardiomyopathy: Secondary | ICD-10-CM | POA: Diagnosis not present

## 2023-03-09 DIAGNOSIS — E785 Hyperlipidemia, unspecified: Secondary | ICD-10-CM | POA: Insufficient documentation

## 2023-03-09 DIAGNOSIS — Z7982 Long term (current) use of aspirin: Secondary | ICD-10-CM | POA: Diagnosis not present

## 2023-03-09 DIAGNOSIS — I5022 Chronic systolic (congestive) heart failure: Secondary | ICD-10-CM | POA: Diagnosis not present

## 2023-03-09 DIAGNOSIS — F17210 Nicotine dependence, cigarettes, uncomplicated: Secondary | ICD-10-CM | POA: Diagnosis not present

## 2023-03-09 LAB — RAD ONC ARIA SESSION SUMMARY
Course Elapsed Days: 15
Plan Fractions Treated to Date: 12
Plan Prescribed Dose Per Fraction: 2.075 Gy
Plan Total Fractions Prescribed: 40
Plan Total Prescribed Dose: 83 Gy
Reference Point Dosage Given to Date: 24.9 Gy
Reference Point Session Dosage Given: 2.075 Gy
Session Number: 12

## 2023-03-10 ENCOUNTER — Ambulatory Visit
Admission: RE | Admit: 2023-03-10 | Discharge: 2023-03-10 | Disposition: A | Discharge status: Home / Self Care | Payer: Medicare Other | Source: Ambulatory Visit | Attending: Radiation Oncology | Admitting: Radiation Oncology

## 2023-03-10 ENCOUNTER — Other Ambulatory Visit: Payer: Self-pay

## 2023-03-10 DIAGNOSIS — C61 Malignant neoplasm of prostate: Secondary | ICD-10-CM | POA: Diagnosis not present

## 2023-03-10 LAB — RAD ONC ARIA SESSION SUMMARY
Course Elapsed Days: 16
Plan Fractions Treated to Date: 13
Plan Prescribed Dose Per Fraction: 2.075 Gy
Plan Total Fractions Prescribed: 40
Plan Total Prescribed Dose: 83 Gy
Reference Point Dosage Given to Date: 26.975 Gy
Reference Point Session Dosage Given: 2.075 Gy
Session Number: 13

## 2023-03-11 ENCOUNTER — Ambulatory Visit
Admission: RE | Admit: 2023-03-11 | Discharge: 2023-03-11 | Disposition: A | Discharge status: Home / Self Care | Payer: Medicare Other | Source: Ambulatory Visit | Attending: Radiation Oncology | Admitting: Radiation Oncology

## 2023-03-11 ENCOUNTER — Other Ambulatory Visit: Payer: Self-pay

## 2023-03-11 DIAGNOSIS — C61 Malignant neoplasm of prostate: Secondary | ICD-10-CM | POA: Diagnosis not present

## 2023-03-11 LAB — RAD ONC ARIA SESSION SUMMARY
Course Elapsed Days: 17
Plan Fractions Treated to Date: 14
Plan Prescribed Dose Per Fraction: 2.075 Gy
Plan Total Fractions Prescribed: 40
Plan Total Prescribed Dose: 83 Gy
Reference Point Dosage Given to Date: 29.05 Gy
Reference Point Session Dosage Given: 2.075 Gy
Session Number: 14

## 2023-03-14 ENCOUNTER — Inpatient Hospital Stay: Payer: Medicare Other

## 2023-03-14 ENCOUNTER — Ambulatory Visit
Admission: RE | Admit: 2023-03-14 | Discharge: 2023-03-14 | Disposition: A | Discharge status: Home / Self Care | Payer: Medicare Other | Source: Ambulatory Visit | Attending: Radiation Oncology | Admitting: Radiation Oncology

## 2023-03-14 ENCOUNTER — Other Ambulatory Visit: Payer: Self-pay

## 2023-03-14 DIAGNOSIS — C61 Malignant neoplasm of prostate: Secondary | ICD-10-CM | POA: Insufficient documentation

## 2023-03-14 LAB — RAD ONC ARIA SESSION SUMMARY
Course Elapsed Days: 20
Plan Fractions Treated to Date: 15
Plan Prescribed Dose Per Fraction: 2.075 Gy
Plan Total Fractions Prescribed: 40
Plan Total Prescribed Dose: 83 Gy
Reference Point Dosage Given to Date: 31.125 Gy
Reference Point Session Dosage Given: 2.075 Gy
Session Number: 15

## 2023-03-14 LAB — CBC (CANCER CENTER ONLY)
HCT: 38.8 % — ABNORMAL LOW (ref 39.0–52.0)
Hemoglobin: 13.1 g/dL (ref 13.0–17.0)
MCH: 31.1 pg (ref 26.0–34.0)
MCHC: 33.8 g/dL (ref 30.0–36.0)
MCV: 92.2 fL (ref 80.0–100.0)
Platelet Count: 162 10*3/uL (ref 150–400)
RBC: 4.21 MIL/uL — ABNORMAL LOW (ref 4.22–5.81)
RDW: 13.2 % (ref 11.5–15.5)
WBC Count: 6.1 10*3/uL (ref 4.0–10.5)
nRBC: 0 % (ref 0.0–0.2)

## 2023-03-15 ENCOUNTER — Ambulatory Visit
Admission: RE | Admit: 2023-03-15 | Discharge: 2023-03-15 | Disposition: A | Discharge status: Home / Self Care | Payer: Medicare Other | Source: Ambulatory Visit | Attending: Radiation Oncology | Admitting: Radiation Oncology

## 2023-03-15 ENCOUNTER — Other Ambulatory Visit: Payer: Self-pay

## 2023-03-15 DIAGNOSIS — C61 Malignant neoplasm of prostate: Secondary | ICD-10-CM | POA: Diagnosis not present

## 2023-03-15 LAB — RAD ONC ARIA SESSION SUMMARY
Course Elapsed Days: 21
Plan Fractions Treated to Date: 16
Plan Prescribed Dose Per Fraction: 2.075 Gy
Plan Total Fractions Prescribed: 40
Plan Total Prescribed Dose: 83 Gy
Reference Point Dosage Given to Date: 33.2 Gy
Reference Point Session Dosage Given: 2.075 Gy
Session Number: 16

## 2023-03-16 ENCOUNTER — Ambulatory Visit
Admission: RE | Admit: 2023-03-16 | Discharge: 2023-03-16 | Disposition: A | Discharge status: Home / Self Care | Payer: Medicare Other | Source: Ambulatory Visit | Attending: Radiation Oncology | Admitting: Radiation Oncology

## 2023-03-16 ENCOUNTER — Other Ambulatory Visit: Payer: Self-pay

## 2023-03-16 DIAGNOSIS — C61 Malignant neoplasm of prostate: Secondary | ICD-10-CM | POA: Diagnosis not present

## 2023-03-16 LAB — RAD ONC ARIA SESSION SUMMARY
Course Elapsed Days: 22
Plan Fractions Treated to Date: 17
Plan Prescribed Dose Per Fraction: 2.075 Gy
Plan Total Fractions Prescribed: 40
Plan Total Prescribed Dose: 83 Gy
Reference Point Dosage Given to Date: 35.275 Gy
Reference Point Session Dosage Given: 2.075 Gy
Session Number: 17

## 2023-03-17 ENCOUNTER — Ambulatory Visit
Admission: RE | Admit: 2023-03-17 | Discharge: 2023-03-17 | Disposition: A | Discharge status: Home / Self Care | Payer: Medicare Other | Source: Ambulatory Visit | Attending: Radiation Oncology | Admitting: Radiation Oncology

## 2023-03-17 ENCOUNTER — Other Ambulatory Visit: Payer: Self-pay

## 2023-03-17 DIAGNOSIS — C61 Malignant neoplasm of prostate: Secondary | ICD-10-CM | POA: Diagnosis not present

## 2023-03-17 LAB — RAD ONC ARIA SESSION SUMMARY
Course Elapsed Days: 23
Plan Fractions Treated to Date: 18
Plan Prescribed Dose Per Fraction: 2.075 Gy
Plan Total Fractions Prescribed: 40
Plan Total Prescribed Dose: 83 Gy
Reference Point Dosage Given to Date: 37.35 Gy
Reference Point Session Dosage Given: 2.075 Gy
Session Number: 18

## 2023-03-18 ENCOUNTER — Other Ambulatory Visit: Payer: Self-pay

## 2023-03-18 ENCOUNTER — Ambulatory Visit
Admission: RE | Admit: 2023-03-18 | Discharge: 2023-03-18 | Disposition: A | Discharge status: Home / Self Care | Payer: Medicare Other | Source: Ambulatory Visit | Attending: Radiation Oncology | Admitting: Radiation Oncology

## 2023-03-18 DIAGNOSIS — C61 Malignant neoplasm of prostate: Secondary | ICD-10-CM | POA: Diagnosis not present

## 2023-03-18 LAB — RAD ONC ARIA SESSION SUMMARY
Course Elapsed Days: 24
Plan Fractions Treated to Date: 19
Plan Prescribed Dose Per Fraction: 2.075 Gy
Plan Total Fractions Prescribed: 40
Plan Total Prescribed Dose: 83 Gy
Reference Point Dosage Given to Date: 39.425 Gy
Reference Point Session Dosage Given: 2.075 Gy
Session Number: 19

## 2023-03-21 ENCOUNTER — Other Ambulatory Visit: Payer: Self-pay

## 2023-03-21 ENCOUNTER — Ambulatory Visit
Admission: RE | Admit: 2023-03-21 | Discharge: 2023-03-21 | Disposition: A | Discharge status: Home / Self Care | Payer: Medicare Other | Source: Ambulatory Visit | Attending: Radiation Oncology | Admitting: Radiation Oncology

## 2023-03-21 DIAGNOSIS — C61 Malignant neoplasm of prostate: Secondary | ICD-10-CM | POA: Diagnosis not present

## 2023-03-21 LAB — RAD ONC ARIA SESSION SUMMARY
Course Elapsed Days: 27
Plan Fractions Treated to Date: 20
Plan Prescribed Dose Per Fraction: 2.075 Gy
Plan Total Fractions Prescribed: 40
Plan Total Prescribed Dose: 83 Gy
Reference Point Dosage Given to Date: 41.5 Gy
Reference Point Session Dosage Given: 2.075 Gy
Session Number: 20

## 2023-03-22 ENCOUNTER — Ambulatory Visit
Admission: RE | Admit: 2023-03-22 | Discharge: 2023-03-22 | Disposition: A | Discharge status: Home / Self Care | Payer: Medicare Other | Source: Ambulatory Visit | Attending: Radiation Oncology | Admitting: Radiation Oncology

## 2023-03-22 ENCOUNTER — Other Ambulatory Visit: Payer: Self-pay

## 2023-03-22 DIAGNOSIS — C61 Malignant neoplasm of prostate: Secondary | ICD-10-CM | POA: Diagnosis not present

## 2023-03-22 LAB — RAD ONC ARIA SESSION SUMMARY
Course Elapsed Days: 28
Plan Fractions Treated to Date: 21
Plan Prescribed Dose Per Fraction: 2.075 Gy
Plan Total Fractions Prescribed: 40
Plan Total Prescribed Dose: 83 Gy
Reference Point Dosage Given to Date: 43.575 Gy
Reference Point Session Dosage Given: 2.075 Gy
Session Number: 21

## 2023-03-23 ENCOUNTER — Ambulatory Visit
Admission: RE | Admit: 2023-03-23 | Discharge: 2023-03-23 | Disposition: A | Discharge status: Home / Self Care | Payer: Medicare Other | Source: Ambulatory Visit | Attending: Radiation Oncology | Admitting: Radiation Oncology

## 2023-03-23 ENCOUNTER — Other Ambulatory Visit: Payer: Self-pay

## 2023-03-23 DIAGNOSIS — C61 Malignant neoplasm of prostate: Secondary | ICD-10-CM | POA: Diagnosis not present

## 2023-03-23 LAB — RAD ONC ARIA SESSION SUMMARY
Course Elapsed Days: 29
Plan Fractions Treated to Date: 22
Plan Prescribed Dose Per Fraction: 2.075 Gy
Plan Total Fractions Prescribed: 40
Plan Total Prescribed Dose: 83 Gy
Reference Point Dosage Given to Date: 45.65 Gy
Reference Point Session Dosage Given: 2.075 Gy
Session Number: 22

## 2023-03-24 ENCOUNTER — Other Ambulatory Visit: Payer: Self-pay

## 2023-03-24 ENCOUNTER — Ambulatory Visit
Admission: RE | Admit: 2023-03-24 | Discharge: 2023-03-24 | Disposition: A | Discharge status: Home / Self Care | Payer: Medicare Other | Source: Ambulatory Visit | Attending: Radiation Oncology | Admitting: Radiation Oncology

## 2023-03-24 DIAGNOSIS — C61 Malignant neoplasm of prostate: Secondary | ICD-10-CM | POA: Diagnosis not present

## 2023-03-24 LAB — RAD ONC ARIA SESSION SUMMARY
Course Elapsed Days: 30
Plan Fractions Treated to Date: 23
Plan Prescribed Dose Per Fraction: 2.075 Gy
Plan Total Fractions Prescribed: 40
Plan Total Prescribed Dose: 83 Gy
Reference Point Dosage Given to Date: 47.725 Gy
Reference Point Session Dosage Given: 2.075 Gy
Session Number: 23

## 2023-03-25 ENCOUNTER — Ambulatory Visit
Admission: RE | Admit: 2023-03-25 | Discharge: 2023-03-25 | Disposition: A | Discharge status: Home / Self Care | Payer: Medicare Other | Source: Ambulatory Visit | Attending: Radiation Oncology | Admitting: Radiation Oncology

## 2023-03-25 ENCOUNTER — Other Ambulatory Visit: Payer: Self-pay

## 2023-03-25 DIAGNOSIS — C61 Malignant neoplasm of prostate: Secondary | ICD-10-CM | POA: Diagnosis not present

## 2023-03-25 LAB — RAD ONC ARIA SESSION SUMMARY
Course Elapsed Days: 31
Plan Fractions Treated to Date: 24
Plan Prescribed Dose Per Fraction: 2.075 Gy
Plan Total Fractions Prescribed: 40
Plan Total Prescribed Dose: 83 Gy
Reference Point Dosage Given to Date: 49.8 Gy
Reference Point Session Dosage Given: 2.075 Gy
Session Number: 24

## 2023-03-28 ENCOUNTER — Ambulatory Visit
Admission: RE | Admit: 2023-03-28 | Discharge: 2023-03-28 | Disposition: A | Discharge status: Home / Self Care | Payer: Medicare Other | Source: Ambulatory Visit | Attending: Radiation Oncology | Admitting: Radiation Oncology

## 2023-03-28 ENCOUNTER — Other Ambulatory Visit: Payer: Self-pay

## 2023-03-28 ENCOUNTER — Inpatient Hospital Stay: Payer: Medicare Other

## 2023-03-28 DIAGNOSIS — C61 Malignant neoplasm of prostate: Secondary | ICD-10-CM | POA: Diagnosis not present

## 2023-03-28 LAB — RAD ONC ARIA SESSION SUMMARY
Course Elapsed Days: 34
Plan Fractions Treated to Date: 25
Plan Prescribed Dose Per Fraction: 2.075 Gy
Plan Total Fractions Prescribed: 40
Plan Total Prescribed Dose: 83 Gy
Reference Point Dosage Given to Date: 51.875 Gy
Reference Point Session Dosage Given: 2.075 Gy
Session Number: 25

## 2023-03-28 LAB — CBC (CANCER CENTER ONLY)
HCT: 37.2 % — ABNORMAL LOW (ref 39.0–52.0)
Hemoglobin: 12.7 g/dL — ABNORMAL LOW (ref 13.0–17.0)
MCH: 31.2 pg (ref 26.0–34.0)
MCHC: 34.1 g/dL (ref 30.0–36.0)
MCV: 91.4 fL (ref 80.0–100.0)
Platelet Count: 147 10*3/uL — ABNORMAL LOW (ref 150–400)
RBC: 4.07 MIL/uL — ABNORMAL LOW (ref 4.22–5.81)
RDW: 13.8 % (ref 11.5–15.5)
WBC Count: 7.6 10*3/uL (ref 4.0–10.5)
nRBC: 0 % (ref 0.0–0.2)

## 2023-03-29 ENCOUNTER — Ambulatory Visit
Admission: RE | Admit: 2023-03-29 | Discharge: 2023-03-29 | Disposition: A | Discharge status: Home / Self Care | Payer: Medicare Other | Source: Ambulatory Visit | Attending: Radiation Oncology | Admitting: Radiation Oncology

## 2023-03-29 ENCOUNTER — Other Ambulatory Visit: Payer: Self-pay

## 2023-03-29 DIAGNOSIS — C61 Malignant neoplasm of prostate: Secondary | ICD-10-CM | POA: Diagnosis not present

## 2023-03-29 LAB — RAD ONC ARIA SESSION SUMMARY
Course Elapsed Days: 35
Plan Fractions Treated to Date: 26
Plan Prescribed Dose Per Fraction: 2.075 Gy
Plan Total Fractions Prescribed: 40
Plan Total Prescribed Dose: 83 Gy
Reference Point Dosage Given to Date: 53.95 Gy
Reference Point Session Dosage Given: 2.075 Gy
Session Number: 26

## 2023-03-30 ENCOUNTER — Ambulatory Visit
Admission: RE | Admit: 2023-03-30 | Discharge: 2023-03-30 | Disposition: A | Discharge status: Home / Self Care | Payer: Medicare Other | Source: Ambulatory Visit | Attending: Radiation Oncology | Admitting: Radiation Oncology

## 2023-03-30 ENCOUNTER — Other Ambulatory Visit: Payer: Self-pay

## 2023-03-30 DIAGNOSIS — C61 Malignant neoplasm of prostate: Secondary | ICD-10-CM | POA: Diagnosis not present

## 2023-03-30 LAB — RAD ONC ARIA SESSION SUMMARY
Course Elapsed Days: 36
Plan Fractions Treated to Date: 27
Plan Prescribed Dose Per Fraction: 2.075 Gy
Plan Total Fractions Prescribed: 40
Plan Total Prescribed Dose: 83 Gy
Reference Point Dosage Given to Date: 56.025 Gy
Reference Point Session Dosage Given: 2.075 Gy
Session Number: 27

## 2023-03-31 ENCOUNTER — Ambulatory Visit
Admission: RE | Admit: 2023-03-31 | Discharge: 2023-03-31 | Disposition: A | Discharge status: Home / Self Care | Payer: Medicare Other | Source: Ambulatory Visit | Attending: Radiation Oncology | Admitting: Radiation Oncology

## 2023-03-31 ENCOUNTER — Other Ambulatory Visit: Payer: Self-pay

## 2023-03-31 DIAGNOSIS — C61 Malignant neoplasm of prostate: Secondary | ICD-10-CM | POA: Diagnosis not present

## 2023-03-31 LAB — RAD ONC ARIA SESSION SUMMARY
Course Elapsed Days: 37
Plan Fractions Treated to Date: 28
Plan Prescribed Dose Per Fraction: 2.075 Gy
Plan Total Fractions Prescribed: 40
Plan Total Prescribed Dose: 83 Gy
Reference Point Dosage Given to Date: 58.1 Gy
Reference Point Session Dosage Given: 2.075 Gy
Session Number: 28

## 2023-04-01 ENCOUNTER — Other Ambulatory Visit: Payer: Self-pay

## 2023-04-01 ENCOUNTER — Ambulatory Visit
Admission: RE | Admit: 2023-04-01 | Discharge: 2023-04-01 | Disposition: A | Discharge status: Home / Self Care | Payer: Medicare Other | Source: Ambulatory Visit | Attending: Radiation Oncology | Admitting: Radiation Oncology

## 2023-04-01 DIAGNOSIS — C61 Malignant neoplasm of prostate: Secondary | ICD-10-CM | POA: Diagnosis not present

## 2023-04-01 LAB — RAD ONC ARIA SESSION SUMMARY
Course Elapsed Days: 38
Plan Fractions Treated to Date: 29
Plan Prescribed Dose Per Fraction: 2.075 Gy
Plan Total Fractions Prescribed: 40
Plan Total Prescribed Dose: 83 Gy
Reference Point Dosage Given to Date: 60.175 Gy
Reference Point Session Dosage Given: 2.075 Gy
Session Number: 29

## 2023-04-05 ENCOUNTER — Ambulatory Visit
Admission: RE | Admit: 2023-04-05 | Discharge: 2023-04-05 | Disposition: A | Discharge status: Home / Self Care | Payer: Medicare Other | Source: Ambulatory Visit | Attending: Radiation Oncology | Admitting: Radiation Oncology

## 2023-04-05 ENCOUNTER — Other Ambulatory Visit: Payer: Self-pay

## 2023-04-05 DIAGNOSIS — C61 Malignant neoplasm of prostate: Secondary | ICD-10-CM | POA: Diagnosis not present

## 2023-04-05 LAB — RAD ONC ARIA SESSION SUMMARY
Course Elapsed Days: 42
Plan Fractions Treated to Date: 30
Plan Prescribed Dose Per Fraction: 2.075 Gy
Plan Total Fractions Prescribed: 40
Plan Total Prescribed Dose: 83 Gy
Reference Point Dosage Given to Date: 62.25 Gy
Reference Point Session Dosage Given: 2.075 Gy
Session Number: 30

## 2023-04-06 ENCOUNTER — Ambulatory Visit
Admission: RE | Admit: 2023-04-06 | Discharge: 2023-04-06 | Disposition: A | Discharge status: Home / Self Care | Payer: Medicare Other | Source: Ambulatory Visit | Attending: Radiation Oncology | Admitting: Radiation Oncology

## 2023-04-06 ENCOUNTER — Other Ambulatory Visit: Payer: Self-pay

## 2023-04-06 DIAGNOSIS — C61 Malignant neoplasm of prostate: Secondary | ICD-10-CM | POA: Diagnosis not present

## 2023-04-06 LAB — RAD ONC ARIA SESSION SUMMARY
Course Elapsed Days: 43
Plan Fractions Treated to Date: 31
Plan Prescribed Dose Per Fraction: 2.075 Gy
Plan Total Fractions Prescribed: 40
Plan Total Prescribed Dose: 83 Gy
Reference Point Dosage Given to Date: 64.325 Gy
Reference Point Session Dosage Given: 2.075 Gy
Session Number: 31

## 2023-04-07 ENCOUNTER — Ambulatory Visit
Admission: RE | Admit: 2023-04-07 | Discharge: 2023-04-07 | Disposition: A | Discharge status: Home / Self Care | Payer: Medicare Other | Source: Ambulatory Visit | Attending: Radiation Oncology | Admitting: Radiation Oncology

## 2023-04-07 ENCOUNTER — Other Ambulatory Visit: Payer: Self-pay

## 2023-04-07 DIAGNOSIS — C61 Malignant neoplasm of prostate: Secondary | ICD-10-CM | POA: Diagnosis not present

## 2023-04-07 LAB — RAD ONC ARIA SESSION SUMMARY
Course Elapsed Days: 44
Plan Fractions Treated to Date: 32
Plan Prescribed Dose Per Fraction: 2.075 Gy
Plan Total Fractions Prescribed: 40
Plan Total Prescribed Dose: 83 Gy
Reference Point Dosage Given to Date: 66.4 Gy
Reference Point Session Dosage Given: 2.075 Gy
Session Number: 32

## 2023-04-08 ENCOUNTER — Ambulatory Visit
Admission: RE | Admit: 2023-04-08 | Discharge: 2023-04-08 | Disposition: A | Discharge status: Home / Self Care | Payer: Medicare Other | Source: Ambulatory Visit | Attending: Radiation Oncology | Admitting: Radiation Oncology

## 2023-04-08 ENCOUNTER — Other Ambulatory Visit: Payer: Self-pay

## 2023-04-08 DIAGNOSIS — C61 Malignant neoplasm of prostate: Secondary | ICD-10-CM | POA: Diagnosis not present

## 2023-04-08 LAB — RAD ONC ARIA SESSION SUMMARY
Course Elapsed Days: 45
Plan Fractions Treated to Date: 33
Plan Prescribed Dose Per Fraction: 2.075 Gy
Plan Total Fractions Prescribed: 40
Plan Total Prescribed Dose: 83 Gy
Reference Point Dosage Given to Date: 68.475 Gy
Reference Point Session Dosage Given: 2.075 Gy
Session Number: 33

## 2023-04-11 ENCOUNTER — Ambulatory Visit
Admission: RE | Admit: 2023-04-11 | Discharge: 2023-04-11 | Disposition: A | Discharge status: Home / Self Care | Payer: Medicare Other | Source: Ambulatory Visit | Attending: Radiation Oncology | Admitting: Radiation Oncology

## 2023-04-11 ENCOUNTER — Other Ambulatory Visit: Payer: Self-pay

## 2023-04-11 ENCOUNTER — Inpatient Hospital Stay: Payer: Medicare Other

## 2023-04-11 DIAGNOSIS — E785 Hyperlipidemia, unspecified: Secondary | ICD-10-CM | POA: Insufficient documentation

## 2023-04-11 DIAGNOSIS — C61 Malignant neoplasm of prostate: Secondary | ICD-10-CM | POA: Insufficient documentation

## 2023-04-11 DIAGNOSIS — Z7951 Long term (current) use of inhaled steroids: Secondary | ICD-10-CM | POA: Insufficient documentation

## 2023-04-11 DIAGNOSIS — N400 Enlarged prostate without lower urinary tract symptoms: Secondary | ICD-10-CM | POA: Insufficient documentation

## 2023-04-11 DIAGNOSIS — F17210 Nicotine dependence, cigarettes, uncomplicated: Secondary | ICD-10-CM | POA: Diagnosis not present

## 2023-04-11 DIAGNOSIS — I5022 Chronic systolic (congestive) heart failure: Secondary | ICD-10-CM | POA: Insufficient documentation

## 2023-04-11 DIAGNOSIS — Z7982 Long term (current) use of aspirin: Secondary | ICD-10-CM | POA: Insufficient documentation

## 2023-04-11 DIAGNOSIS — J431 Panlobular emphysema: Secondary | ICD-10-CM | POA: Insufficient documentation

## 2023-04-11 DIAGNOSIS — Z8719 Personal history of other diseases of the digestive system: Secondary | ICD-10-CM | POA: Diagnosis not present

## 2023-04-11 DIAGNOSIS — K219 Gastro-esophageal reflux disease without esophagitis: Secondary | ICD-10-CM | POA: Diagnosis not present

## 2023-04-11 DIAGNOSIS — I110 Hypertensive heart disease with heart failure: Secondary | ICD-10-CM | POA: Insufficient documentation

## 2023-04-11 DIAGNOSIS — I420 Dilated cardiomyopathy: Secondary | ICD-10-CM | POA: Diagnosis not present

## 2023-04-11 DIAGNOSIS — Z79899 Other long term (current) drug therapy: Secondary | ICD-10-CM | POA: Diagnosis not present

## 2023-04-11 LAB — RAD ONC ARIA SESSION SUMMARY
Course Elapsed Days: 48
Plan Fractions Treated to Date: 34
Plan Prescribed Dose Per Fraction: 2.075 Gy
Plan Total Fractions Prescribed: 40
Plan Total Prescribed Dose: 83 Gy
Reference Point Dosage Given to Date: 70.55 Gy
Reference Point Session Dosage Given: 2.075 Gy
Session Number: 34

## 2023-04-11 LAB — CBC (CANCER CENTER ONLY)
HCT: 35.9 % — ABNORMAL LOW (ref 39.0–52.0)
Hemoglobin: 12.2 g/dL — ABNORMAL LOW (ref 13.0–17.0)
MCH: 31.2 pg (ref 26.0–34.0)
MCHC: 34 g/dL (ref 30.0–36.0)
MCV: 91.8 fL (ref 80.0–100.0)
Platelet Count: 167 10*3/uL (ref 150–400)
RBC: 3.91 MIL/uL — ABNORMAL LOW (ref 4.22–5.81)
RDW: 14.5 % (ref 11.5–15.5)
WBC Count: 6 10*3/uL (ref 4.0–10.5)
nRBC: 0 % (ref 0.0–0.2)

## 2023-04-12 ENCOUNTER — Other Ambulatory Visit: Payer: Self-pay

## 2023-04-12 ENCOUNTER — Ambulatory Visit
Admission: RE | Admit: 2023-04-12 | Discharge: 2023-04-12 | Disposition: A | Discharge status: Home / Self Care | Payer: Medicare Other | Source: Ambulatory Visit | Attending: Radiation Oncology | Admitting: Radiation Oncology

## 2023-04-12 DIAGNOSIS — C61 Malignant neoplasm of prostate: Secondary | ICD-10-CM | POA: Diagnosis not present

## 2023-04-12 LAB — RAD ONC ARIA SESSION SUMMARY
Course Elapsed Days: 49
Plan Fractions Treated to Date: 35
Plan Prescribed Dose Per Fraction: 2.075 Gy
Plan Total Fractions Prescribed: 40
Plan Total Prescribed Dose: 83 Gy
Reference Point Dosage Given to Date: 72.625 Gy
Reference Point Session Dosage Given: 2.075 Gy
Session Number: 35

## 2023-04-13 ENCOUNTER — Ambulatory Visit
Admission: RE | Admit: 2023-04-13 | Discharge: 2023-04-13 | Disposition: A | Discharge status: Home / Self Care | Payer: Medicare Other | Source: Ambulatory Visit | Attending: Radiation Oncology | Admitting: Radiation Oncology

## 2023-04-13 ENCOUNTER — Other Ambulatory Visit: Payer: Self-pay

## 2023-04-13 DIAGNOSIS — C61 Malignant neoplasm of prostate: Secondary | ICD-10-CM | POA: Diagnosis not present

## 2023-04-13 LAB — RAD ONC ARIA SESSION SUMMARY
Course Elapsed Days: 50
Plan Fractions Treated to Date: 36
Plan Prescribed Dose Per Fraction: 2.075 Gy
Plan Total Fractions Prescribed: 40
Plan Total Prescribed Dose: 83 Gy
Reference Point Dosage Given to Date: 74.7 Gy
Reference Point Session Dosage Given: 2.075 Gy
Session Number: 36

## 2023-04-14 ENCOUNTER — Other Ambulatory Visit: Payer: Self-pay

## 2023-04-14 ENCOUNTER — Ambulatory Visit
Admission: RE | Admit: 2023-04-14 | Discharge: 2023-04-14 | Disposition: A | Discharge status: Home / Self Care | Payer: Medicare Other | Source: Ambulatory Visit | Attending: Radiation Oncology | Admitting: Radiation Oncology

## 2023-04-14 DIAGNOSIS — C61 Malignant neoplasm of prostate: Secondary | ICD-10-CM | POA: Diagnosis not present

## 2023-04-14 LAB — RAD ONC ARIA SESSION SUMMARY
Course Elapsed Days: 51
Plan Fractions Treated to Date: 37
Plan Prescribed Dose Per Fraction: 2.075 Gy
Plan Total Fractions Prescribed: 40
Plan Total Prescribed Dose: 83 Gy
Reference Point Dosage Given to Date: 76.775 Gy
Reference Point Session Dosage Given: 2.075 Gy
Session Number: 37

## 2023-04-15 ENCOUNTER — Ambulatory Visit
Admission: RE | Admit: 2023-04-15 | Discharge: 2023-04-15 | Disposition: A | Discharge status: Home / Self Care | Payer: Medicare Other | Source: Ambulatory Visit | Attending: Radiation Oncology | Admitting: Radiation Oncology

## 2023-04-15 ENCOUNTER — Other Ambulatory Visit: Payer: Self-pay

## 2023-04-15 DIAGNOSIS — C61 Malignant neoplasm of prostate: Secondary | ICD-10-CM | POA: Diagnosis not present

## 2023-04-15 LAB — RAD ONC ARIA SESSION SUMMARY
Course Elapsed Days: 52
Plan Fractions Treated to Date: 38
Plan Prescribed Dose Per Fraction: 2.075 Gy
Plan Total Fractions Prescribed: 40
Plan Total Prescribed Dose: 83 Gy
Reference Point Dosage Given to Date: 78.85 Gy
Reference Point Session Dosage Given: 2.075 Gy
Session Number: 38

## 2023-04-18 ENCOUNTER — Other Ambulatory Visit: Payer: Self-pay

## 2023-04-18 ENCOUNTER — Ambulatory Visit
Admission: RE | Admit: 2023-04-18 | Discharge: 2023-04-18 | Disposition: A | Discharge status: Home / Self Care | Payer: Medicare Other | Source: Ambulatory Visit | Attending: Radiation Oncology | Admitting: Radiation Oncology

## 2023-04-18 DIAGNOSIS — C61 Malignant neoplasm of prostate: Secondary | ICD-10-CM | POA: Diagnosis not present

## 2023-04-18 LAB — RAD ONC ARIA SESSION SUMMARY
Course Elapsed Days: 55
Plan Fractions Treated to Date: 39
Plan Prescribed Dose Per Fraction: 2.075 Gy
Plan Total Fractions Prescribed: 40
Plan Total Prescribed Dose: 83 Gy
Reference Point Dosage Given to Date: 80.925 Gy
Reference Point Session Dosage Given: 2.075 Gy
Session Number: 39

## 2023-04-19 ENCOUNTER — Other Ambulatory Visit: Payer: Self-pay

## 2023-04-19 ENCOUNTER — Ambulatory Visit
Admission: RE | Admit: 2023-04-19 | Discharge: 2023-04-19 | Disposition: A | Discharge status: Home / Self Care | Payer: Medicare Other | Source: Ambulatory Visit | Attending: Radiation Oncology | Admitting: Radiation Oncology

## 2023-04-19 DIAGNOSIS — C61 Malignant neoplasm of prostate: Secondary | ICD-10-CM | POA: Diagnosis not present

## 2023-04-19 LAB — RAD ONC ARIA SESSION SUMMARY
Course Elapsed Days: 56
Plan Fractions Treated to Date: 40
Plan Prescribed Dose Per Fraction: 2.075 Gy
Plan Total Fractions Prescribed: 40
Plan Total Prescribed Dose: 83 Gy
Reference Point Dosage Given to Date: 83 Gy
Reference Point Session Dosage Given: 2.075 Gy
Session Number: 40

## 2023-05-30 ENCOUNTER — Encounter: Payer: Self-pay | Admitting: Radiation Oncology

## 2023-05-30 ENCOUNTER — Ambulatory Visit
Admission: RE | Admit: 2023-05-30 | Discharge: 2023-05-30 | Disposition: A | Discharge status: Home / Self Care | Payer: Medicare Other | Source: Ambulatory Visit | Attending: Radiation Oncology | Admitting: Radiation Oncology

## 2023-05-30 ENCOUNTER — Other Ambulatory Visit: Payer: Self-pay | Admitting: *Deleted

## 2023-05-30 VITALS — BP 134/77 | HR 80 | Temp 97.6°F | Resp 29 | Wt 171.7 lb

## 2023-05-30 DIAGNOSIS — C61 Malignant neoplasm of prostate: Secondary | ICD-10-CM | POA: Insufficient documentation

## 2023-05-30 NOTE — Progress Notes (Signed)
Radiation Oncology Follow up Note  Name: Mathew Reyes   Date:   05/30/2023 MRN:  [id number] DOB: Jun 23, 1941    This 82 y.o. male presents to the clinic today for 1 month follow-up status post IMRT radiation therapy to prostate and pelvic nodes for stage IIIc Gleason 9 (4+5) adenocarcinoma presenting with a PSA of 9.7.  REFERRING PROVIDER: Marguarite Arbour, MD  HPI: Patient is a 82 year old male now out 1 month having completed IMRT radiation therapy to his prostate and pelvic nodes for Gleason 9 adenocarcinoma the prostate.  Seen today in routine follow-up he is doing well he tends towards constipation has been taking some stool softeners for that.  He is having no significant increased lower urinary tract symptoms..  COMPLICATIONS OF TREATMENT: none  FOLLOW UP COMPLIANCE: keeps appointments   PHYSICAL EXAM:  BP 134/77   Pulse 80   Temp 97.6 F (36.4 C) (Tympanic)   Resp (!) 29   Wt 171 lb 11.2 oz (77.9 kg)   BMI 26.89 kg/m  Well-developed well-nourished patient in NAD. HEENT reveals PERLA, EOMI, discs not visualized.  Oral cavity is clear. No oral mucosal lesions are identified. Neck is clear without evidence of cervical or supraclavicular adenopathy. Lungs are clear to A&P. Cardiac examination is essentially unremarkable with regular rate and rhythm without murmur rub or thrill. Abdomen is benign with no organomegaly or masses noted. Motor sensory and DTR levels are equal and symmetric in the upper and lower extremities. Cranial nerves II through XII are grossly intact. Proprioception is intact. No peripheral adenopathy or edema is identified. No motor or sensory levels are noted. Crude visual fields are within normal range.  RADIOLOGY RESULTS: No current films for review  PLAN: Present time patient is clinically doing well with low side effect profile.  Of asked to see him back in 3 months for follow-up with a PSA.  Of also asked him take a follow-up appointment with Dr. Lonna Cobb  since he will probably have 2 to 3 years of ADT therapy.  His next injection will be in approximately 3 months.  Patient knows to call sooner with any concerns.  I would like to take this opportunity to thank you for allowing me to participate in the care of your patient.Carmina Miller, MD

## 2023-06-07 ENCOUNTER — Telehealth: Payer: Self-pay | Admitting: Urology

## 2023-06-07 NOTE — Telephone Encounter (Signed)
Mathew Reyes called and would like to know when patient is supposed to get another Testosterone shot? Also, Dr. Lonna Cobb put patient on Calcium and Vitamin D. Does he need to continue taking these? Leave vm if she doesn't answer when you call her back. [phone]. She also scheduled a follow up appt with Dr. Lonna Cobb for 08/31/23 per Dr. Rushie Chestnut.

## 2023-06-07 NOTE — Telephone Encounter (Signed)
Patient will found out from  Dr. Eliseo Squires How many eligard shot he will need.

## 2023-06-13 ENCOUNTER — Telehealth: Payer: Self-pay

## 2023-06-21 ENCOUNTER — Telehealth: Payer: Self-pay

## 2023-06-21 NOTE — Telephone Encounter (Signed)
-----   Message from Nurse Cyril Mourning sent at 06/07/2023  1:05 PM EDT ----- Dr Rushie Chestnut would like this patient to have two to three year of ADT. Thanks, [employer]

## 2023-07-22 NOTE — Telephone Encounter (Signed)
error 

## 2023-08-18 ENCOUNTER — Telehealth: Payer: Self-pay

## 2023-08-18 NOTE — Telephone Encounter (Signed)
Patient wanted to be sure cancer medication is available when he comes in for his appointment.

## 2023-08-22 NOTE — Telephone Encounter (Signed)
Eligard is available in office. No PA required.   Called significant other per DPR. No answer.

## 2023-08-23 ENCOUNTER — Inpatient Hospital Stay: Payer: Medicare Other

## 2023-08-29 ENCOUNTER — Telehealth: Payer: Self-pay | Admitting: Urology

## 2023-08-29 ENCOUNTER — Ambulatory Visit: Payer: Medicare Other | Admitting: Radiation Oncology

## 2023-08-29 NOTE — Telephone Encounter (Signed)
We order the Eligard . Thanks

## 2023-08-29 NOTE — Telephone Encounter (Signed)
Radford Pax, friend (on [place]) LMOM that said if we were giving pt injection (Eligard) at his appt on Wednesday to call the cancer center so they can order.  Pt has appt w/[facility] this Wednesday 10/23 @ 8.

## 2023-08-31 ENCOUNTER — Encounter: Payer: Self-pay | Admitting: Urology

## 2023-08-31 ENCOUNTER — Ambulatory Visit (INDEPENDENT_AMBULATORY_CARE_PROVIDER_SITE_OTHER): Payer: Medicare Other | Admitting: Urology

## 2023-08-31 DIAGNOSIS — C61 Malignant neoplasm of prostate: Secondary | ICD-10-CM

## 2023-08-31 MED ORDER — LEUPROLIDE ACETATE (6 MONTH) 45 MG ~~LOC~~ KIT
45.0000 mg | PACK | Freq: Once | SUBCUTANEOUS | Status: AC
Start: 2023-08-31 — End: 2023-08-31
  Administered 2023-08-31: 45 mg via SUBCUTANEOUS

## 2023-08-31 NOTE — Progress Notes (Signed)
Eligard SubQ Injection   Due to Prostate Cancer patient is present today for a Eligard Injection.  Medication: Eligard 6 month Dose: 45 mg  Location: left  Lot: [id number] Exp: 09/2024  Patient tolerated well, no complications were noted  Performed by: Rudene Anda  Per Dr. Lonna Cobb patient is to continue therapy for 6 months . Patient's next follow up was scheduled for 03/01/2024. This appointment was scheduled using wheel and given to patient today along with reminder continue on Vitamin D 800-1000iu and Calcium 1000-1200mg  daily while on Androgen Deprivation Therapy.  PA approval dates:

## 2023-09-01 ENCOUNTER — Ambulatory Visit: Payer: Medicare Other | Admitting: Radiation Oncology

## 2023-09-05 ENCOUNTER — Encounter: Payer: Self-pay | Admitting: Radiation Oncology

## 2023-09-05 ENCOUNTER — Ambulatory Visit
Admission: RE | Admit: 2023-09-05 | Discharge: 2023-09-05 | Disposition: A | Discharge status: Home / Self Care | Payer: Medicare Other | Source: Ambulatory Visit | Attending: Radiation Oncology | Admitting: Radiation Oncology

## 2023-09-05 VITALS — BP 127/80 | HR 87 | Temp 98.0°F | Resp 16 | Wt 173.0 lb

## 2023-09-05 DIAGNOSIS — Z923 Personal history of irradiation: Secondary | ICD-10-CM | POA: Insufficient documentation

## 2023-09-05 DIAGNOSIS — R197 Diarrhea, unspecified: Secondary | ICD-10-CM | POA: Diagnosis not present

## 2023-09-05 DIAGNOSIS — C775 Secondary and unspecified malignant neoplasm of intrapelvic lymph nodes: Secondary | ICD-10-CM | POA: Diagnosis not present

## 2023-09-05 DIAGNOSIS — R5383 Other fatigue: Secondary | ICD-10-CM | POA: Insufficient documentation

## 2023-09-05 DIAGNOSIS — C61 Malignant neoplasm of prostate: Secondary | ICD-10-CM | POA: Diagnosis present

## 2023-09-05 NOTE — Progress Notes (Signed)
Radiation Oncology Follow up Note  Name: Mathew Reyes   Date:   09/05/2023 MRN:  [id number] DOB: June 07, 1941    This 82 y.o. male presents to the clinic today for 43-month follow-up status post IMRT radiation therapy to his prostate and pelvic nodes for stage IIIc Gleason 9 (4+5) adenocarcinoma presenting with a PSA of 9.7  REFERRING PROVIDER: Marguarite Arbour, MD  HPI: Patient is an 82 year old male now out for months having completed IMRT radiation therapy to prostate and pelvic nodes for Gleason 9 (4+5) adenocarcinoma of the prostate seen today in routine follow-up he is doing well.  Specifically denies any increased lower urinary tract symptoms diarrhea or fatigue his most recent PSA.  Is less than 0.01 showing excellent biochemical control of his prostate cancer he is still on ADT therapy.  COMPLICATIONS OF TREATMENT: none  FOLLOW UP COMPLIANCE: keeps appointments   PHYSICAL EXAM:  BP 127/80   Pulse 87   Temp 98 F (36.7 C) (Tympanic)   Resp 16   Wt 173 lb (78.5 kg)   BMI 27.10 kg/m  Well-developed well-nourished patient in NAD. HEENT reveals PERLA, EOMI, discs not visualized.  Oral cavity is clear. No oral mucosal lesions are identified. Neck is clear without evidence of cervical or supraclavicular adenopathy. Lungs are clear to A&P. Cardiac examination is essentially unremarkable with regular rate and rhythm without murmur rub or thrill. Abdomen is benign with no organomegaly or masses noted. Motor sensory and DTR levels are equal and symmetric in the upper and lower extremities. Cranial nerves II through XII are grossly intact. Proprioception is intact. No peripheral adenopathy or edema is identified. No motor or sensory levels are noted. Crude visual fields are within normal range.  RADIOLOGY RESULTS: No current films for review  PLAN: Present time patient is under excellent biochemical control of his prostate cancer.  I have asked to see him back in 6 months for follow-up  with repeat PSA.  Patient knows to call with any concerns.  I would like to take this opportunity to thank you for allowing me to participate in the care of your patient.Carmina Miller, MD

## 2023-10-18 ENCOUNTER — Other Ambulatory Visit: Payer: Self-pay | Admitting: Thoracic Surgery (Cardiothoracic Vascular Surgery)

## 2023-10-18 DIAGNOSIS — I7121 Aneurysm of the ascending aorta, without rupture: Secondary | ICD-10-CM

## 2023-11-22 ENCOUNTER — Ambulatory Visit: Payer: Medicare Other | Admitting: Thoracic Surgery (Cardiothoracic Vascular Surgery)

## 2023-11-22 ENCOUNTER — Other Ambulatory Visit: Payer: Medicare Other

## 2023-12-20 ENCOUNTER — Ambulatory Visit (INDEPENDENT_AMBULATORY_CARE_PROVIDER_SITE_OTHER): Payer: Medicare Other | Admitting: Thoracic Surgery (Cardiothoracic Vascular Surgery)

## 2023-12-20 ENCOUNTER — Encounter: Payer: Self-pay | Admitting: Thoracic Surgery (Cardiothoracic Vascular Surgery)

## 2023-12-20 ENCOUNTER — Ambulatory Visit
Admission: RE | Admit: 2023-12-20 | Discharge: 2023-12-20 | Disposition: A | Discharge status: Home / Self Care | Payer: Medicare Other | Source: Ambulatory Visit | Attending: Thoracic Surgery (Cardiothoracic Vascular Surgery) | Admitting: Thoracic Surgery (Cardiothoracic Vascular Surgery)

## 2023-12-20 VITALS — BP 135/76 | HR 82 | Resp 20 | Ht 67.0 in | Wt 174.8 lb

## 2023-12-20 DIAGNOSIS — I7121 Aneurysm of the ascending aorta, without rupture: Secondary | ICD-10-CM

## 2023-12-20 MED ORDER — IOPAMIDOL (ISOVUE-370) INJECTION 76%
500.0000 mL | Freq: Once | INTRAVENOUS | Status: AC | PRN
  Administered 2023-12-20: 75 mL via INTRAVENOUS

## 2023-12-20 NOTE — Progress Notes (Signed)
[address]       Mathew Reyes [id number]             [phone]     HPI: Mathew Reyes returns for follow-up of his ascending aneurysm  Mathew Reyes is an 83 year old man with a history of an ascending thoracic aneurysm, thoracic aortic atherosclerosis, coronary atherosclerosis, congestive heart failure, tobacco abuse, COPD, hypertension, hyperlipidemia, reflux, Barrett's esophagus, prediabetes, prostate cancer, psoriasis, anxiety, and depression.  He was found to have an ascending aneurysm on a CT in May 2023.  It was a noncontrast scan and the aorta was reported to be 4.8 cm.  I saw him in January 2024.  CT angiogram showed a 4.6 cm ascending aneurysm.  In the interim since his last visit he has been feeling well.  He denies any chest pain, pressure, or tightness.  He does get short of breath with heavy activity particularly if he is lifting heavy objects.  Still smoking.  Past Medical History:  Diagnosis Date   Abdominal hernia    Anxiety    Arthritis    Barrett's esophagus    with h/o (+) low grade dysplasia   BPH (benign prostatic hyperplasia)    COPD (chronic obstructive pulmonary disease) (HCC)    DCM (dilated cardiomyopathy) (HCC)    DDD (degenerative disc disease), lumbar    Depression    DOE (dyspnea on exertion)    Erectile dysfunction    GERD (gastroesophageal reflux disease)    HFrEF (heart failure with reduced ejection fraction) (HCC)    HLD (hyperlipidemia)    Hypertension    Panlobular emphysema (HCC)    Prediabetes    Psoriasis    Situational anxiety    SOB (shortness of breath)    Tobacco abuse      Current Outpatient Medications  Medication Sig Dispense Refill   aspirin EC 81 MG tablet Take 81 mg by mouth daily. Swallow whole.     budesonide-formoterol (SYMBICORT) 160-4.5 MCG/ACT inhaler Inhale 2 puffs into the lungs 2 (two) times daily.     calcipotriene-betamethasone (TACLONEX) ointment Apply 1 application topically in the  morning and at bedtime.     calcium carbonate (OSCAL) 1500 (600 Ca) MG TABS tablet Take 600 mg of elemental calcium by mouth daily with breakfast.     carvedilol (COREG) 3.125 MG tablet Take 3.125 mg by mouth 2 (two) times daily.      digoxin (LANOXIN) 0.125 MG tablet Take 0.125 mg by mouth daily.   0   doxazosin (CARDURA) 2 MG tablet Take 2 mg by mouth daily.     ENTRESTO 24-26 MG Take 1 tablet by mouth daily.     finasteride (PROSCAR) 5 MG tablet Take 1 tablet (5 mg total) by mouth daily. 90 tablet 4   FLUoxetine (PROZAC) 20 MG capsule Take 20 mg by mouth daily.     levalbuterol (XOPENEX HFA) 45 MCG/ACT inhaler Inhale 1-2 puffs into the lungs in the morning and at bedtime.     montelukast (SINGULAIR) 10 MG tablet Take 10 mg by mouth at bedtime.      Multiple Vitamin (MULTIVITAMIN WITH MINERALS) TABS tablet Take 1 tablet by mouth daily.     Omega-3 Fatty Acids (FISH OIL) 1000 MG CAPS Take 1,000 mg by mouth daily.     rosuvastatin (CRESTOR) 10 MG tablet Take 10 mg by mouth at bedtime.     sildenafil (VIAGRA) 100 MG tablet Take 100 mg by mouth as needed for  erectile dysfunction.      tamsulosin (FLOMAX) 0.4 MG CAPS capsule Take 0.4 mg by mouth in the morning and at bedtime.     tiotropium (SPIRIVA) 18 MCG inhalation capsule Place 18 mcg into inhaler and inhale daily.     vitamin C (ASCORBIC ACID) 500 MG tablet Take 500 mg by mouth daily.     omeprazole (PRILOSEC) 20 MG capsule Take 20 mg by mouth daily.      No current facility-administered medications for this visit.    Physical Exam BP 135/76 (BP Location: Left Arm, Patient Position: Sitting, Cuff Size: Normal)   Pulse 82   Resp 20   Ht 5\' 7"  (1.702 m)   Wt 174 lb 12.8 oz (79.3 kg)   SpO2 92% Comment: RA  BMI 27.38 kg/m  Well-appearing 83 year old man in no acute distress Alert and oriented x 3 with no focal deficits Lungs clear with equal breath sounds bilaterally Cardiac regular rate and rhythm with no murmur No peripheral  edema No carotid bruits  Diagnostic Tests: I personally reviewed the CT angiogram of the chest done today.  The official radiology reading is not yet available.  No change in the 4.6 cm ascending aneurysm.  Aortic and coronary atherosclerosis.  I do not see any suspicious lung nodules.  Impression: Mathew Reyes is an 83 year old man with a history of an ascending thoracic aneurysm, thoracic aortic atherosclerosis, coronary atherosclerosis, congestive heart failure, tobacco abuse, COPD, hypertension, hyperlipidemia, reflux, Barrett's esophagus, prediabetes, prostate cancer, psoriasis, anxiety, and depression.  Ascending aneurysm-stable at 4.6 cm.  He went a year between scans.  At this size he really needs to be followed semiannually.  Recommended he return in 6 months with a CT of the chest.  Hypertension-blood pressure well-controlled on current regimen.  Aortic and coronary atherosclerosis-on fish oil and aspirin.  Tobacco use-advised to quit smoking  Plan: Return in 6 months with CT chest  Loreli Slot, MD [facility] [phone]

## 2024-03-01 ENCOUNTER — Ambulatory Visit: Payer: Medicare Other | Admitting: Physician Assistant

## 2024-03-05 ENCOUNTER — Ambulatory Visit: Payer: Self-pay | Admitting: Physician Assistant

## 2024-03-05 ENCOUNTER — Inpatient Hospital Stay: Payer: Medicare Other | Attending: Radiation Oncology

## 2024-03-05 DIAGNOSIS — C61 Malignant neoplasm of prostate: Secondary | ICD-10-CM | POA: Insufficient documentation

## 2024-03-05 LAB — PSA: Prostatic Specific Antigen: <0.01 ng/mL (ref 0.00–4.00)

## 2024-03-06 ENCOUNTER — Ambulatory Visit (INDEPENDENT_AMBULATORY_CARE_PROVIDER_SITE_OTHER): Admitting: Physician Assistant

## 2024-03-06 DIAGNOSIS — C61 Malignant neoplasm of prostate: Secondary | ICD-10-CM

## 2024-03-06 MED ORDER — LEUPROLIDE ACETATE (6 MONTH) 45 MG ~~LOC~~ KIT
45.0000 mg | PACK | Freq: Once | SUBCUTANEOUS | Status: AC
Start: 2024-03-06 — End: ?

## 2024-03-06 NOTE — Progress Notes (Signed)
 03/06/2024 12:06 PM   Mathew Reyes 1940-11-20 [id number]  CC: Chief Complaint  Patient presents with   Prostate Cancer   HPI: Mathew Reyes is a 83 y.o. male with very high risk prostate cancer s/p IMRT on ADT since 01/26/2023 who presents today for 87-month Eligard .  He is accompanied today by his wife.  Today he reports he is tolerating ADT well with no side effects including no hot flashes.  He feels well and has no acute concerns.  He continues to take calcium  and vitamin D  supplements.  PSA yesterday remained undetectable.  He is due to follow-up with Dr. Jacalyn Martin next month and is up-to-date with his PCP.  PMH: Past Medical History:  Diagnosis Date   Abdominal hernia    Anxiety    Arthritis    Barrett's esophagus    with h/o (+) low grade dysplasia   BPH (benign prostatic hyperplasia)    COPD (chronic obstructive pulmonary disease) (HCC)    DCM (dilated cardiomyopathy) (HCC)    DDD (degenerative disc disease), lumbar    Depression    DOE (dyspnea on exertion)    Erectile dysfunction    GERD (gastroesophageal reflux disease)    HFrEF (heart failure with reduced ejection fraction) (HCC)    HLD (hyperlipidemia)    Hypertension    Panlobular emphysema (HCC)    Prediabetes    Psoriasis    Situational anxiety    SOB (shortness of breath)    Tobacco abuse     Surgical History: Past Surgical History:  Procedure Laterality Date   APPENDECTOMY     CARPAL TUNNEL RELEASE Left 05/04/2021   Procedure: CARPAL TUNNEL RELEASE;  Surgeon: Arlyne Lame, MD;  Location: [facility] ORS;  Service: Orthopedics;  Laterality: Left;   COLONOSCOPY, ESOPHAGOGASTRODUODENOSCOPY (EGD) AND ESOPHAGEAL DILATION     ESOPHAGOGASTRODUODENOSCOPY N/A 03/20/2015   Procedure: ESOPHAGOGASTRODUODENOSCOPY (EGD);  Surgeon: Stephens Eis, MD;  Location: [facility] ENDOSCOPY;  Service: Gastroenterology;  Laterality: N/A;   ESOPHAGOGASTRODUODENOSCOPY N/A 08/28/2015   Procedure: ESOPHAGOGASTRODUODENOSCOPY  (EGD);  Surgeon: Stephens Eis, MD;  Location: [facility] ENDOSCOPY;  Service: Gastroenterology;  Laterality: N/A;   ESOPHAGOGASTRODUODENOSCOPY N/A 11/27/2015   Procedure: ESOPHAGOGASTRODUODENOSCOPY (EGD);  Surgeon: Stephens Eis, MD;  Location: [facility] END[facility]COPY;  Service: Gastroenterology;  Laterality: N/A;   Hemmorrhoidectomy     HERNIA REPAIR     JOINT REPLACEMENT     Throid polyp     TOTAL HIP ARTHROPLASTY Left 04/14/2020   Procedure: TOTAL HIP ARTHROPLASTY;  Surgeon: Arlyne Lame, MD;  Location: [facility] ORS;  Service: Orthopedics;  Laterality: Left;    Home Medications:  Allergies as of 03/06/2024   No Known Allergies      Medication List        Accurate as of March 06, 2024 12:06 PM. If you have any questions, ask your nurse or doctor.          ascorbic acid  500 MG tablet Commonly known as: VITAMIN C  Take 500 mg by mouth daily.   aspirin  EC 81 MG tablet Take 81 mg by mouth daily. Swallow whole.   budesonide -formoterol  160-4.5 MCG/ACT inhaler Commonly known as: SYMBICORT  Inhale 2 puffs into the lungs 2 (two) times daily.   calcipotriene-betamethasone ointment Commonly known as: TACLONEX Apply 1 application topically in the morning and at bedtime.   calcium  carbonate 1500 (600 Ca) MG Tabs tablet Commonly known as: OSCAL Take 600 mg of elemental calcium  by mouth daily with breakfast.   carvedilol  3.125 MG tablet  Commonly known as: COREG  Take 3.125 mg by mouth 2 (two) times daily.   digoxin  0.125 MG tablet Commonly known as: LANOXIN  Take 0.125 mg by mouth daily.   doxazosin 2 MG tablet Commonly known as: CARDURA Take 2 mg by mouth daily.   Entresto  24-26 MG Generic drug: sacubitril -valsartan  Take 1 tablet by mouth daily.   finasteride  5 MG tablet Commonly known as: Proscar  Take 1 tablet (5 mg total) by mouth daily.   Fish Oil 1000 MG Caps Take 1,000 mg by mouth daily.   FLUoxetine  20 MG capsule Commonly known as: PROZAC  Take 20 mg by mouth daily.    levalbuterol  45 MCG/ACT inhaler Commonly known as: XOPENEX  HFA Inhale 1-2 puffs into the lungs in the morning and at bedtime.   montelukast  10 MG tablet Commonly known as: SINGULAIR  Take 10 mg by mouth at bedtime.   multivitamin with minerals Tabs tablet Take 1 tablet by mouth daily.   omeprazole 20 MG capsule Commonly known as: PRILOSEC Take 20 mg by mouth daily.   rosuvastatin 10 MG tablet Commonly known as: CRESTOR Take 10 mg by mouth at bedtime.   sildenafil 100 MG tablet Commonly known as: VIAGRA Take 100 mg by mouth as needed for erectile dysfunction.   tamsulosin  0.4 MG Caps capsule Commonly known as: FLOMAX  Take 0.4 mg by mouth in the morning and at bedtime.   tiotropium 18 MCG inhalation capsule Commonly known as: SPIRIVA Place 18 mcg into inhaler and inhale daily.        Allergies:  No Known Allergies  Family History: Family History  Problem Relation Age of Onset   Alzheimer's disease Mother    Aortic aneurysm Father    Kidney disease Neg Hx    Prostate cancer Neg Hx     Social History:   reports that he has been smoking cigarettes. He has a 60 pack-year smoking history. He has never used smokeless tobacco. He reports current alcohol use. He reports that he does not use drugs.  Physical Exam: There were no vitals taken for this visit.  Constitutional:  Alert and oriented, no acute distress, nontoxic appearing HEENT: [place], AT Cardiovascular: No clubbing, cyanosis, or edema Respiratory: Normal respiratory effort, no increased work of breathing Skin: No rashes, bruises or suspicious lesions Neurologic: Grossly intact, no focal deficits, moving all 4 extremities Psychiatric: Normal mood and affect  Laboratory Data: Results for orders placed or performed in visit on 03/05/24  PSA   Collection Time: 03/05/24  8:59 AM  Result Value Ref Range   Prostatic Specific Antigen <0.01 0.00 - 4.00 ng/mL   Assessment & Plan:   1. Prostate cancer (HCC)  (Primary) Excellent biochemical control, tolerating ADT well.  30-month Eligard  administered today, see separate note.  Continue calcium  + Vit D. Will have him follow-up in 6 months for next Eligard . - leuprolide  (6 Month) (ELIGARD ) injection 45 mg  Return in about 6 months (around 09/05/2024) for Prostate cancer follow-up + Eligard .  Nihal Doan, PA-C  [facility] [place] [address] [place], [place] [address] [phone]

## 2024-03-06 NOTE — Progress Notes (Signed)
 Eligard  SubQ Injection    Due to Prostate Cancer patient is present today for a Eligard  Injection.   Medication: Eligard  6 month Dose: 45 mg  Location: left  Lot: [id number] Exp: 1   Patient tolerated well, no complications were noted   Performed by: Arleene Belt   Per Dr. Cherylene Corrente patient is to continue therapy for 6 months . Patient's next follow up was scheduled for 09/05/2024.This appointment was scheduled using wheel and given to patient today along with reminder continue on Vitamin D  800-1000iu and Calcium  1000-1200mg  daily while on Androgen Deprivation Therapy.  PA approval dates:

## 2024-03-12 ENCOUNTER — Encounter: Payer: Self-pay | Admitting: Radiation Oncology

## 2024-03-12 ENCOUNTER — Ambulatory Visit
Admission: RE | Admit: 2024-03-12 | Discharge: 2024-03-12 | Disposition: A | Discharge status: Home / Self Care | Payer: Medicare Other | Source: Ambulatory Visit | Attending: Radiation Oncology | Admitting: Radiation Oncology

## 2024-03-12 VITALS — BP 133/83 | HR 69 | Resp 16 | Ht 67.0 in | Wt 165.8 lb

## 2024-03-12 DIAGNOSIS — Z923 Personal history of irradiation: Secondary | ICD-10-CM | POA: Diagnosis present

## 2024-03-12 DIAGNOSIS — C61 Malignant neoplasm of prostate: Secondary | ICD-10-CM | POA: Insufficient documentation

## 2024-03-12 NOTE — Progress Notes (Signed)
 Radiation Oncology Follow up Note  Name: Mathew Reyes   Date:   03/12/2024 MRN:  [id number] DOB: 26-Sep-1941    This 83 y.o. male presents to the clinic today for 71-month follow-up status post image guided IMRT radiation therapy to his prostate and pelvic nodes for stage IIIc Gleason 9 (4+5) adenocarcinoma presenting with a PSA of 9.7.  REFERRING PROVIDER: Yehuda Helms, MD  HPI: Patient is an 83 year old male now out 10 months having completed image guided IMRT radiation therapy to his prostate for Gleason 9 adenocarcinoma presenting with a PSA of 9.7.  Seen today in routine follow-up he is doing well.  Specifically denies any increased lower urinary tract symptoms diarrhea or fatigue.  He is currently still on ADT therapy.  His most recent PSA is less than 0.01.  COMPLICATIONS OF TREATMENT: none  FOLLOW UP COMPLIANCE: keeps appointments   PHYSICAL EXAM:  BP 133/83   Pulse 69   Resp 16   Ht 5\' 7"  (1.702 m)   Wt 165 lb 12.8 oz (75.2 kg)   BMI 25.97 kg/m  Well-developed well-nourished patient in NAD. HEENT reveals PERLA, EOMI, discs not visualized.  Oral cavity is clear. No oral mucosal lesions are identified. Neck is clear without evidence of cervical or supraclavicular adenopathy. Lungs are clear to A&P. Cardiac examination is essentially unremarkable with regular rate and rhythm without murmur rub or thrill. Abdomen is benign with no organomegaly or masses noted. Motor sensory and DTR levels are equal and symmetric in the upper and lower extremities. Cranial nerves II through XII are grossly intact. Proprioception is intact. No peripheral adenopathy or edema is identified. No motor or sensory levels are noted. Crude visual fields are within normal range.  RADIOLOGY RESULTS: No current films for review  PLAN: At the present time patient is doing well under excellent biochemical control of his prostate cancer.  I have asked to see him back in 6 months with a follow-up PSA.  He  continues on ADT therapy through urology.  Patient knows to call with any concerns.  I would like to take this opportunity to thank you for allowing me to participate in the care of your patient.Glenis Langdon, MD

## 2024-03-13 ENCOUNTER — Other Ambulatory Visit: Payer: Self-pay | Admitting: Cardiovascular Disease

## 2024-03-13 DIAGNOSIS — I5032 Chronic diastolic (congestive) heart failure: Secondary | ICD-10-CM

## 2024-04-13 ENCOUNTER — Telehealth (HOSPITAL_COMMUNITY): Payer: Self-pay | Admitting: *Deleted

## 2024-04-13 NOTE — Telephone Encounter (Signed)
Reaching out to patient to offer assistance regarding upcoming cardiac imaging study; pt verbalizes understanding of appt date/time, parking situation and where to check in, pre-test NPO status  and verified current allergies; name and call back number provided for further questions should they arise ? ?Mathew Irion RN Navigator Cardiac Imaging ?[facility] ?[phone] office ?[phone] cell ? ?

## 2024-04-16 ENCOUNTER — Encounter: Payer: Self-pay | Admitting: Cardiovascular Disease

## 2024-04-16 ENCOUNTER — Other Ambulatory Visit: Payer: Self-pay | Admitting: Cardiology

## 2024-04-16 ENCOUNTER — Ambulatory Visit
Admission: RE | Admit: 2024-04-16 | Discharge: 2024-04-16 | Disposition: A | Discharge status: Home / Self Care | Source: Ambulatory Visit | Attending: Cardiovascular Disease | Admitting: Cardiovascular Disease

## 2024-04-16 ENCOUNTER — Other Ambulatory Visit

## 2024-04-16 DIAGNOSIS — I5032 Chronic diastolic (congestive) heart failure: Secondary | ICD-10-CM

## 2024-04-16 DIAGNOSIS — Q245 Malformation of coronary vessels: Secondary | ICD-10-CM | POA: Diagnosis present

## 2024-04-16 DIAGNOSIS — R931 Abnormal findings on diagnostic imaging of heart and coronary circulation: Secondary | ICD-10-CM | POA: Insufficient documentation

## 2024-04-16 DIAGNOSIS — I2510 Atherosclerotic heart disease of native coronary artery without angina pectoris: Secondary | ICD-10-CM | POA: Diagnosis not present

## 2024-04-16 MED ORDER — DILTIAZEM HCL 25 MG/5ML IV SOLN
10.0000 mg | INTRAVENOUS | Status: DC | PRN
  Filled 2024-04-16: qty 5

## 2024-04-16 MED ORDER — IOHEXOL 350 MG/ML SOLN
75.0000 mL | Freq: Once | INTRAVENOUS | Status: AC | PRN
  Administered 2024-04-16: 75 mL via INTRAVENOUS

## 2024-04-16 MED ORDER — NITROGLYCERIN 0.4 MG SL SUBL
0.8000 mg | SUBLINGUAL_TABLET | Freq: Once | SUBLINGUAL | Status: AC
  Administered 2024-04-16: 0.8 mg via SUBLINGUAL
  Filled 2024-04-16: qty 25

## 2024-04-16 MED ORDER — METOPROLOL TARTRATE 5 MG/5ML IV SOLN
10.0000 mg | INTRAVENOUS | Status: DC | PRN
  Filled 2024-04-16: qty 10

## 2024-04-16 MED ORDER — NITROGLYCERIN 0.4 MG SL SUBL
SUBLINGUAL_TABLET | SUBLINGUAL | Status: AC
  Filled 2024-04-16: qty 2

## 2024-04-17 DIAGNOSIS — I2510 Atherosclerotic heart disease of native coronary artery without angina pectoris: Secondary | ICD-10-CM | POA: Diagnosis not present

## 2024-04-17 DIAGNOSIS — R931 Abnormal findings on diagnostic imaging of heart and coronary circulation: Secondary | ICD-10-CM | POA: Diagnosis not present

## 2024-05-10 ENCOUNTER — Other Ambulatory Visit: Payer: Self-pay | Admitting: Thoracic Surgery (Cardiothoracic Vascular Surgery)

## 2024-05-10 DIAGNOSIS — I7121 Aneurysm of the ascending aorta, without rupture: Secondary | ICD-10-CM

## 2024-06-11 ENCOUNTER — Ambulatory Visit (HOSPITAL_COMMUNITY)

## 2024-06-18 ENCOUNTER — Ambulatory Visit

## 2024-07-13 ENCOUNTER — Ambulatory Visit
Admission: RE | Admit: 2024-07-13 | Discharge: 2024-07-13 | Disposition: A | Discharge status: Home / Self Care | Attending: Internal Medicine | Admitting: Internal Medicine

## 2024-07-13 ENCOUNTER — Other Ambulatory Visit: Payer: Self-pay

## 2024-07-13 ENCOUNTER — Encounter
Admission: RE | Disposition: A | Discharge status: Home / Self Care | Payer: Self-pay | Source: Home / Self Care | Attending: Internal Medicine

## 2024-07-13 ENCOUNTER — Encounter: Payer: Self-pay | Admitting: Internal Medicine

## 2024-07-13 DIAGNOSIS — I2584 Coronary atherosclerosis due to calcified coronary lesion: Secondary | ICD-10-CM | POA: Diagnosis not present

## 2024-07-13 DIAGNOSIS — I517 Cardiomegaly: Secondary | ICD-10-CM | POA: Insufficient documentation

## 2024-07-13 DIAGNOSIS — R931 Abnormal findings on diagnostic imaging of heart and coronary circulation: Secondary | ICD-10-CM

## 2024-07-13 DIAGNOSIS — Z7902 Long term (current) use of antithrombotics/antiplatelets: Secondary | ICD-10-CM | POA: Insufficient documentation

## 2024-07-13 DIAGNOSIS — Z7982 Long term (current) use of aspirin: Secondary | ICD-10-CM | POA: Diagnosis not present

## 2024-07-13 DIAGNOSIS — I2510 Atherosclerotic heart disease of native coronary artery without angina pectoris: Secondary | ICD-10-CM | POA: Insufficient documentation

## 2024-07-13 DIAGNOSIS — I429 Cardiomyopathy, unspecified: Secondary | ICD-10-CM | POA: Diagnosis not present

## 2024-07-13 HISTORY — PX: LEFT HEART CATH AND CORONARY ANGIOGRAPHY: CATH118249

## 2024-07-13 SURGERY — LEFT HEART CATH AND CORONARY ANGIOGRAPHY
Anesthesia: Moderate Sedation | Laterality: Left

## 2024-07-13 MED ORDER — VERAPAMIL HCL 2.5 MG/ML IV SOLN
INTRAVENOUS | Status: DC | PRN
  Administered 2024-07-13 (×2): 2.5 mg via INTRA_ARTERIAL

## 2024-07-13 MED ORDER — SODIUM CHLORIDE 0.9% FLUSH: 3.0000 mL | INTRAVENOUS | Status: DC | PRN

## 2024-07-13 MED ORDER — IOHEXOL 300 MG/ML  SOLN
INTRAMUSCULAR | Status: DC | PRN
  Administered 2024-07-13: 88 mL

## 2024-07-13 MED ORDER — SODIUM CHLORIDE 0.9 % IV SOLN: 250.0000 mL | INTRAVENOUS | Status: DC | PRN

## 2024-07-13 MED ORDER — HEPARIN (PORCINE) IN NACL 1000-0.9 UT/500ML-% IV SOLN
INTRAVENOUS | Status: DC | PRN
  Administered 2024-07-13 (×2): 500 mL

## 2024-07-13 MED ORDER — HEPARIN (PORCINE) IN NACL 1000-0.9 UT/500ML-% IV SOLN
INTRAVENOUS | Status: AC
  Filled 2024-07-13: qty 1000

## 2024-07-13 MED ORDER — FENTANYL CITRATE (PF) 100 MCG/2ML IJ SOLN
INTRAMUSCULAR | Status: AC
  Filled 2024-07-13: qty 2

## 2024-07-13 MED ORDER — HEPARIN SODIUM (PORCINE) 1000 UNIT/ML IJ SOLN
INTRAMUSCULAR | Status: DC | PRN
Start: 2024-07-13 — End: 2024-07-13
  Administered 2024-07-13: 3000 [IU] via INTRAVENOUS

## 2024-07-13 MED ORDER — ASPIRIN 81 MG PO CHEW
81.0000 mg | CHEWABLE_TABLET | ORAL | Status: AC
  Administered 2024-07-13: 81 mg via ORAL

## 2024-07-13 MED ORDER — LIDOCAINE HCL (PF) 1 % IJ SOLN
INTRAMUSCULAR | Status: DC | PRN
  Administered 2024-07-13: 5 mL

## 2024-07-13 MED ORDER — MIDAZOLAM HCL 2 MG/2ML IJ SOLN
INTRAMUSCULAR | Status: DC | PRN
  Administered 2024-07-13: 1 mg via INTRAVENOUS

## 2024-07-13 MED ORDER — VERAPAMIL HCL 2.5 MG/ML IV SOLN
INTRAVENOUS | Status: AC
  Filled 2024-07-13: qty 2

## 2024-07-13 MED ORDER — SODIUM CHLORIDE 0.9 % IV SOLN
250.0000 mL | INTRAVENOUS | Status: DC | PRN
  Administered 2024-07-13: 250 mL via INTRAVENOUS

## 2024-07-13 MED ORDER — FENTANYL CITRATE (PF) 100 MCG/2ML IJ SOLN
INTRAMUSCULAR | Status: DC | PRN
  Administered 2024-07-13: 25 ug via INTRAVENOUS

## 2024-07-13 MED ORDER — ASPIRIN 81 MG PO CHEW
CHEWABLE_TABLET | ORAL | Status: AC
  Filled 2024-07-13: qty 1

## 2024-07-13 MED ORDER — SODIUM CHLORIDE 0.9% FLUSH: 3.0000 mL | Freq: Two times a day (BID) | INTRAVENOUS | Status: DC

## 2024-07-13 MED ORDER — FREE WATER
500.0000 mL | Freq: Once | Status: AC
  Administered 2024-07-13: 500 mL via ORAL

## 2024-07-13 MED ORDER — LIDOCAINE HCL 1 % IJ SOLN
INTRAMUSCULAR | Status: AC
  Filled 2024-07-13: qty 20

## 2024-07-13 MED ORDER — MIDAZOLAM HCL 2 MG/2ML IJ SOLN
INTRAMUSCULAR | Status: AC
  Filled 2024-07-13: qty 2

## 2024-07-13 MED ORDER — HEPARIN SODIUM (PORCINE) 1000 UNIT/ML IJ SOLN
INTRAMUSCULAR | Status: AC
  Filled 2024-07-13: qty 10

## 2024-07-13 MED ORDER — FREE WATER
250.0000 mL | Freq: Once | Status: AC
  Administered 2024-07-13: 250 mL via ORAL

## 2024-07-13 SURGICAL SUPPLY — 11 items
CATH INFINITI 5 FR JL3.5 (CATHETERS) IMPLANT
CATH INFINITI JR4 5F (CATHETERS) IMPLANT
DEVICE RAD TR BAND REGULAR (VASCULAR PRODUCTS) IMPLANT
DRAPE BRACHIAL (DRAPES) IMPLANT
GLIDESHEATH SLEND SS 6F .021 (SHEATH) IMPLANT
GUIDEWIRE INQWIRE 1.5J.035X260 (WIRE) IMPLANT
KIT SYRINGE INJ CVI SPIKEX1 (MISCELLANEOUS) IMPLANT
PACK CARDIAC CATH (CUSTOM PROCEDURE TRAY) ×1 IMPLANT
SET ATX-X65L (MISCELLANEOUS) IMPLANT
SHEATH 6FR 75 DEST SLENDER (SHEATH) IMPLANT
STATION PROTECTION PRESSURIZED (MISCELLANEOUS) IMPLANT

## 2024-07-16 DIAGNOSIS — R931 Abnormal findings on diagnostic imaging of heart and coronary circulation: Secondary | ICD-10-CM

## 2024-07-18 ENCOUNTER — Encounter: Payer: Self-pay | Admitting: Internal Medicine

## 2024-08-01 NOTE — Progress Notes (Signed)
 Established Patient Visit   Chief Complaint: Chief Complaint  Patient presents with  . Follow-up    6 month follow up.    Date of Service: 08/07/2024 Date of Birth: [birth year] PCP: Mathew Reyes BIRCH, MD  History of Present Illness: Mathew Reyes is a 83 y.o.male patient who presents for a 1 month follow up. PMH significant for congestive heart failure, systolic dysfunction, systolic cardiomyopathy nonischemic, COPD, shortness of breath, smoking, hypertension, hyperlipidemia, and borderline obesity.  Today, pt presents with fatigue and SOB. He had some recent cardiac tests. Patient's wife would like to order some labs due to his increased fatigue. Patient is planning to stop working soon. Patient smokes. Denies having any swelling in his feet. Order Cardiac Stress MRI for further assessment of ischemic cardiomyopathy. Refer pt to [facility] for further discussion of treatment options such as a CRTP. Order iron, B12, BNP labwork for further assessment of fatigue.   Visit Summaries: 06/25/2024 Patient was seen by me for a follow up. Will schedule left heart catheterization.  Past Medical and Surgical History  Past Medical History Past Medical History:  Diagnosis Date  . Abdominal hernia   . Arthritis   . Barrett's esophagus    with history of low dysplasia  . BPH (benign prostatic hypertrophy)   . Cardiomyopathy (CMS/HHS-HCC)   . CHF (congestive heart failure) (CMS/HHS-HCC)   . COPD (chronic obstructive pulmonary disease) (CMS/HHS-HCC)   . DDD (degenerative disc disease), lumbar 11/03/2015  . Depression    mild  . DOE (dyspnea on exertion)   . GERD (gastroesophageal reflux disease)   . History of cancer   . Hyperlipidemia   . Hypertension    controlled with diet and exercise  . Psoriasis   . Situational anxiety   . SOB (shortness of breath)   . Tobacco abuse     Past Surgical History He has a past surgical history that includes Hernia repair; Appendectomy (1952); Colonoscopy  (11/28/03; 04/11/08); egd (11/28/03; 01/04/06; 01/11/07; 06/16/10); Hemorrhoidectomy By Simple Ligation; Tonsillectomy; Colonoscopy; Upper gastrointestinal endoscopy; Polypectomy; Colonoscopy (08/15/2014 [facility]); egd (08/15/2014 [facility]); egd (09/12/2014); EGD w/ Barrx (01/23/2015); EGD w/ Barrx (03/20/2015); EGD w/ Barrx (08/28/2015); EGD with BARRX (11/27/2015);  Left total hip arthroplasty (04/14/2020); and Left carpal tunnel release (05/04/2021).   Medications and Allergies  Current Medications  Current Outpatient Medications  Medication Sig Dispense Refill  . ascorbic acid  (VITAMIN C ) 500 MG tablet Take 500 mg by mouth once daily.    . aspirin  81 MG EC tablet TAKE 1 TABLET EVERY DAY 90 tablet 1  . BREYNA  160-4.5 mcg/actuation inhaler USE 2 INHALATIONS ORALLY   TWICE DAILY 30.9 g 1  . calcium  carbonate 600 mg calcium  (1,500 mg) Tab tablet Take 600 mg by mouth once daily       . carvediloL  (COREG ) 6.25 MG tablet Take 1 tablet (6.25 mg total) by mouth 2 (two) times daily with meals 180 tablet 3  . dapagliflozin propanediol (FARXIGA) 10 mg tablet Take 1 tablet (10 mg total) by mouth once daily 90 tablet 3  . digoxin  (LANOXIN ) 0.125 MG tablet TAKE 1 TABLET ONCE DAILY 90 tablet 1  . doxazosin (CARDURA) 2 MG tablet TAKE 1 TABLET AT BEDTIME 90 tablet 3  . ENTRESTO  24-26 mg tablet TAKE 1 TABLET EVERY 12     HOURS 180 tablet 3  . finasteride  (PROSCAR ) 5 mg tablet TAKE 1 TABLET ONCE DAILY 90 tablet 3  . finerenone 20 mg Tab Take 20 mg by mouth once daily 90 tablet  3  . FLUoxetine  (PROZAC ) 20 MG capsule TAKE 1 CAPSULE ONCE DAILY 90 capsule 1  . levalbuterol  (XOPENEX  HFA) inhaler USE 2 INHALATIONS ORALLY   EVERY 4 HOURS 45 g 1  . montelukast  (SINGULAIR ) 10 mg tablet TAKE 1 TABLET AT BEDTIME 90 tablet 1  . nystatin (MYCOSTATIN) 100,000 unit/gram cream Apply topically 2 (two) times daily 30 g 3  . omega-3 fatty acids/fish oil 340-1,000 mg capsule Take 1 capsule by mouth 2 (two) times daily      . omeprazole (PRILOSEC)  20 MG DR capsule TAKE 1 CAPSULE ONCE DAILY 90 capsule 1  . rosuvastatin (CRESTOR) 10 MG tablet TAKE 1 TABLET ONCE DAILY 90 tablet 3  . sildenafiL (VIAGRA) 100 MG tablet TAKE 1 TABLET ONCE DAILY ASNEEDED 18 tablet 1  . SPIRIVA WITH HANDIHALER 18 mcg inhalation capsule PLACE 1 CAPSULE INTO       INHALER AND INHALE ONCE    DAILY 90 capsule 3  . tamsulosin  (FLOMAX ) 0.4 mg capsule TAKE 1 CAPSULE TWICE DAILY 30 MINUTES AFTER THE SAME  MEALS EACH DAY 180 capsule 1   No current facility-administered medications for this visit.    Allergies: Patient has no known allergies.  Social and Family History  Social History  reports that he has been smoking cigarettes. He has a 60 pack-year smoking history. He has never used smokeless tobacco. He reports current alcohol use of about 7.0 standard drinks of alcohol per week. He reports that he does not use drugs.  Family History Family History  Problem Relation Name Age of Onset  . Alzheimer's disease Mother    . Other Mother         Nervous Breakdown  . No Known Problems Father    . No Known Problems Sister    . Other Maternal Aunt         Cancer  . Parkinsonism Paternal Uncle      Review of Systems   Pertinent positives and negatives are mentioned above in HPI and all other systems are negative.  Physical Examination   Vitals:BP 114/60   Pulse 71   Ht 170.2 cm (5' 7)   Wt 74.4 kg (164 lb)   SpO2 93%   BMI 25.69 kg/m  Ht:170.2 cm (5' 7) Wt:74.4 kg (164 lb) ADJ:Anib surface area is 1.88 meters squared. Body mass index is 25.69 kg/m.  HEENT: Pupils equally reactive to light and accomodation  Neck: Supple without thyromegaly, carotid pulses 2+ Lungs: clear to auscultation bilaterally; no wheezes, rales, rhonchi Heart: Regular rate and rhythm.  No gallops, murmurs or rub Abdomen: soft nontender, nondistended, with normal bowel sounds Extremities: no cyanosis, clubbing, or edema Peripheral Pulses: 2+ in all extremities, 2+ femoral  pulses bilaterally Neurologic: Alert and oriented X3; speech intact; face symmetrical; moves all extremities well  Cardiovascular Studies:    Echocardiogram 2D complete: 03/13/2024 CONCLUSION ------------------------------------------------------------------------------- MILD LEFT VENTRICULAR SYSTOLIC DYSFUNCTION WITH MODERATE LVH ESTIMATED EF: 40%, CALC EF(2D): 40% NORMAL LA PRESSURES WITH DIASTOLIC DYSFUNCTION (GRADE 1) NORMAL RIGHT VENTRICULAR SYSTOLIC FUNCTION VALVULAR REGURGITATION: TRIVIAL AR, MILD MR, TRIVIAL PR, MILD TR NO VALVULAR STENOSIS  NM Myocardial Perfusion SPECT multiple (stress and rest): (10/23/2015)  The study is normal.   This is a low risk study.   Nuclear stress EF: 45%.    Cardiac Catheterization:  07/13/2024 Narrative  This result has an attachment that is not available. .  Prox LAD lesion is 75% stenosed with 75% stenosed side branch in 1st Sept. .  Prox  RCA lesion is 25% stenosed. .  There is moderate left ventricular systolic dysfunction. .  The left ventricular ejection fraction is 35-45% by visual estimate. .  Recommend uninterrupted dual antiplatelet therapy with Aspirin  81mg  daily.  Holter:  Cardiac CT Scan: 04/16/2024 IMPRESSION:  1. Coronary calcium  score of 1110. This was 67th percentile for age  and sex matched control.   2. Normal coronary origin with right dominance.   3. Moderate mid LAD stenosis (50-69%).   4. Minimal RCA and LCx stenosis.   5. CAD-RADS 3. Moderate stenosis. Consider symptom-guided  anti-ischemic pharmacotherapy as well as risk factor modification  per guideline directed care.   6. Additional analysis with CT FFR will be submitted and reported  separately.   Cardiac MRI:   Assessment   83 y.o. male with  1. Cardiomyopathy, unspecified type (CMS/HHS-HCC)   2. Chronic systolic heart failure (CMS/HHS-HCC)   3. Primary hypertension   4. Mixed hyperlipidemia   5. OSA (obstructive sleep apnea)   6.  Chronic obstructive pulmonary disease, unspecified COPD type (CMS/HHS-HCC)   7. Gastroesophageal reflux disease, unspecified whether esophagitis present   8. Tobacco use   9. Fatigue, unspecified type        Plan   Cardiomyopathy, moderate, depressed left ventricular function, compensated, continue current medical therapy, order cardiac stress MRI for further assessment  CHF, continue entresto , digoxin , coreg , farxiga, AICD placed Hypertension, today's BP was 114/60, reasonably controlled, continue coreg , entresto , imdur Hyperlipidemia, continue Crestor therapy for lipid management  OSA, recommend sleep study, CPAP if indicated, weight loss COPD, continue inhalers, management per PCP GERD, continue omeprazole for reflux type symptoms  Smoking history, recommend tobacco cessation Fatigue, order iron, B12, BNP labs for further assessment      Return in about 3 months (around 11/06/2024).  This note is partially written by Leita Ellen, in the presence of and acting as the scribe of Dr. Cara Lovelace.      Leita Ellen  I have reviewed, edited and added to the note to reflect my best personal medical judgment.  Attestation Statement:   I personally performed the service. (TP)  DWAYNE JONETTA LOVELACE, MD  [facility] [place], [place] Ph:  [phone] Fax:  [phone] This note was generated in part with voice recognition software, Dragon.  I apologize for any typographical errors that were not detected and corrected from this process.  They are unintentional.

## 2024-08-13 ENCOUNTER — Other Ambulatory Visit (HOSPITAL_COMMUNITY): Payer: Self-pay | Admitting: Internal Medicine

## 2024-08-13 DIAGNOSIS — I5022 Chronic systolic (congestive) heart failure: Secondary | ICD-10-CM

## 2024-08-13 DIAGNOSIS — I429 Cardiomyopathy, unspecified: Secondary | ICD-10-CM

## 2024-08-15 ENCOUNTER — Encounter (HOSPITAL_COMMUNITY): Payer: Self-pay | Admitting: Emergency Medicine

## 2024-09-03 ENCOUNTER — Other Ambulatory Visit (HOSPITAL_COMMUNITY): Payer: Self-pay | Admitting: *Deleted

## 2024-09-03 ENCOUNTER — Telehealth (HOSPITAL_COMMUNITY): Payer: Self-pay | Admitting: *Deleted

## 2024-09-03 ENCOUNTER — Ambulatory Visit (INDEPENDENT_AMBULATORY_CARE_PROVIDER_SITE_OTHER): Admitting: Urology

## 2024-09-03 ENCOUNTER — Encounter: Payer: Self-pay | Admitting: Urology

## 2024-09-03 VITALS — BP 127/80 | HR 74 | Ht 67.0 in | Wt 165.0 lb

## 2024-09-03 DIAGNOSIS — Z01810 Encounter for preprocedural cardiovascular examination: Secondary | ICD-10-CM

## 2024-09-03 DIAGNOSIS — C61 Malignant neoplasm of prostate: Secondary | ICD-10-CM

## 2024-09-03 MED ORDER — LEUPROLIDE ACETATE (6 MONTH) 45 MG ~~LOC~~ KIT
45.0000 mg | PACK | Freq: Once | SUBCUTANEOUS | Status: AC
  Administered 2024-09-03: 45 mg via SUBCUTANEOUS

## 2024-09-03 NOTE — Telephone Encounter (Signed)
 Attempted to call patient regarding upcoming cardiac MRI appointment. Left message on voicemail with name and callback number  Larey Brick RN Navigator Cardiac Imaging [facility] [phone] Office [phone] Cell

## 2024-09-03 NOTE — Progress Notes (Unsigned)
 Eligard  SubQ Injection   Due to Prostate Cancer patient is present today for a Eligard  Injection.  Medication: Eligard  6 month Dose: 45 mg  Location: right  Lot: [id number] Exp: 09/2025  Patient tolerated well, no complications were noted  Performed by: Laymon Ned  Per Dr. Twylla patient is to continue therapy for six months . Patient's next follow up was scheduled for no follow up. This appointment was scheduled using wheel and given to patient today along with reminder continue on Vitamin D  800-1000iu and Calcium  1000-1200mg  daily while on Androgen Deprivation Therapy.  PA approval dates:

## 2024-09-03 NOTE — Telephone Encounter (Signed)
 Reaching out to patient to offer assistance regarding upcoming cardiac imaging study; pt verbalizes understanding of appt date/time, parking situation and where to check in, pre-test NPO status and verified current allergies; name and call back number provided for further questions should they arise  Chantal Requena RN Navigator Cardiac Imaging [facility] [phone] office [phone] cell  Patient aware to avoid caffeine and flomax  prior to his stress MRI. He denies metal or claustrophobia.

## 2024-09-03 NOTE — Progress Notes (Unsigned)
 09/03/2024 11:39 AM   Mathew Reyes 1941-06-13 [id number]  Referring provider: Auston Reyes BIRCH, MD [address] [facility] [place],  [place] [address]  Chief Complaint  Patient presents with   Prostate Cancer   Urologic history:  1.  Prostate cancer NCCN: Very high risk Prostate MRI 10/04/2022 for PSA 9.54: 26 cc volume; PI-RADS 5 lesion right PZ; PI-RADS 4 lesion left PZ MR fusion biopsy 11/22/2022; standard template biopsy +2 cores each ROI; PI-RADS 5 lesion positive Gleason 4+5 adenocarcinoma 2/2 cores (80%, 60%); ROI 2- 1/2 cores Gleason 4+4 adenocarcinoma (20%). 5/6 right-sided cores of the template biopsies showed Gleason 4+3/4+4/4+5.  PSMA/PET/2024: Focal activity right prostate; negative adenopathy, visceral or skeletal metastases IMRT completed 04/19/2023 ADT (62-month leuprolide ): 03/07/2023; 08/31/2023; 03/06/2024; 09/03/2024  HPI: Mathew Reyes is a 83 y.o. male presents for prostate cancer follow-up.  No complaints since last visit Denies dysuria, gross hematuria No bothersome LUTS On tamsulosin /finasteride  prescribed by PCP Last PSA 03/05/24 undetectable at < 0.1  PSA trend    Prostate Specific Ag, Serum  Latest Ref Rng 0.0 - 4.0 ng/mL  08/17/2022 9.65  02/21/2023 5.7  08/23/2023 < 0.01  03/05/2024 < 0.01    PMH: Past Medical History:  Diagnosis Date   Abdominal hernia    Anxiety    Arthritis    Barrett's esophagus    with h/o (+) low grade dysplasia   BPH (benign prostatic hyperplasia)    COPD (chronic obstructive pulmonary disease) (HCC)    DCM (dilated cardiomyopathy) (HCC)    DDD (degenerative disc disease), lumbar    DOE (dyspnea on exertion)    Erectile dysfunction    GERD (gastroesophageal reflux disease)    HFrEF (heart failure with reduced ejection fraction) (HCC)    HLD (hyperlipidemia)    Panlobular emphysema (HCC)    Prediabetes    Psoriasis    Situational anxiety    SOB (shortness of breath)    Tobacco abuse      Surgical History: Past Surgical History:  Procedure Laterality Date   APPENDECTOMY     CARPAL TUNNEL RELEASE Left 05/04/2021   Procedure: CARPAL TUNNEL RELEASE;  Surgeon: Mardee Lynwood SQUIBB, MD;  Location: [facility] ORS;  Service: Orthopedics;  Laterality: Left;   COLONOSCOPY, ESOPHAGOGASTRODUODENOSCOPY (EGD) AND ESOPHAGEAL DILATION     CT RADIATION THERAPY GUIDE     prostate   ESOPHAGOGASTRODUODENOSCOPY N/A 03/20/2015   Procedure: ESOPHAGOGASTRODUODENOSCOPY (EGD);  Surgeon: Deward CINDERELLA Piedmont, MD;  Location: [facility] ENDOSCOPY;  Service: Gastroenterology;  Laterality: N/A;   ESOPHAGOGASTRODUODENOSCOPY N/A 08/28/2015   Procedure: ESOPHAGOGASTRODUODENOSCOPY (EGD);  Surgeon: Deward CINDERELLA Piedmont, MD;  Location: [facility] ENDOSCOPY;  Service: Gastroenterology;  Laterality: N/A;   ESOPHAGOGASTRODUODENOSCOPY N/A 11/27/2015   Procedure: ESOPHAGOGASTRODUODENOSCOPY (EGD);  Surgeon: Deward CINDERELLA Piedmont, MD;  Location: [facility] ENDOSCOPY;  Service: Gastroenterology;  Laterality: N/A;   HERNIA REPAIR     JOINT REPLACEMENT     LEFT HEART CATH AND CORONARY ANGIOGRAPHY Left 07/13/2024   Procedure: LEFT HEART CATH AND CORONARY ANGIOGRAPHY;  Surgeon: Florencio Cara BIRCH, MD;  Location: [facility] INVASIVE CV LAB;  Service: Cardiovascular;  Laterality: Left;   Throid polyp     TOTAL HIP ARTHROPLASTY Left 04/14/2020   Procedure: TOTAL HIP ARTHROPLASTY;  Surgeon: Mardee Lynwood SQUIBB, MD;  Location: [facility] ORS;  Service: Orthopedics;  Laterality: Left;    Home Medications:  Allergies as of 09/03/2024   No Known Allergies      Medication List        Accurate as  of September 03, 2024 11:39 AM. If you have any questions, ask your nurse or doctor.          ascorbic acid  500 MG tablet Commonly known as: VITAMIN C  Take 500 mg by mouth daily.   aspirin  EC 81 MG tablet Take 81 mg by mouth daily. Swallow whole.   budesonide -formoterol  160-4.5 MCG/ACT inhaler Commonly known as: SYMBICORT  Inhale 2 puffs into the lungs 2 (two) times daily.    calcipotriene-betamethasone ointment Commonly known as: TACLONEX Apply 1 application topically in the morning and at bedtime.   calcium  carbonate 1500 (600 Ca) MG Tabs tablet Commonly known as: OSCAL Take 600 mg of elemental calcium  by mouth daily with breakfast.   carvedilol  6.25 MG tablet Commonly known as: COREG  Take 3.125 mg by mouth 2 (two) times daily.   dapagliflozin propanediol 10 MG Tabs tablet Commonly known as: FARXIGA Take 10 mg by mouth daily.   digoxin  0.125 MG tablet Commonly known as: LANOXIN  Take 0.125 mg by mouth daily.   doxazosin 2 MG tablet Commonly known as: CARDURA Take 2 mg by mouth daily.   Entresto  24-26 MG Generic drug: sacubitril -valsartan  Take 1 tablet by mouth daily.   finasteride  5 MG tablet Commonly known as: Proscar  Take 1 tablet (5 mg total) by mouth daily.   Fish Oil 1000 MG Caps Take 1,000 mg by mouth daily.   FLUoxetine  20 MG capsule Commonly known as: PROZAC  Take 20 mg by mouth daily.   Kerendia 20 MG Tabs Generic drug: Finerenone Take 1 tablet by mouth daily.   levalbuterol  45 MCG/ACT inhaler Commonly known as: XOPENEX  HFA Inhale 1-2 puffs into the lungs in the morning and at bedtime.   montelukast  10 MG tablet Commonly known as: SINGULAIR  Take 10 mg by mouth at bedtime.   multivitamin with minerals Tabs tablet Take 1 tablet by mouth daily.   omeprazole 20 MG capsule Commonly known as: PRILOSEC Take 20 mg by mouth daily.   rosuvastatin 10 MG tablet Commonly known as: CRESTOR Take 10 mg by mouth at bedtime.   sildenafil 100 MG tablet Commonly known as: VIAGRA Take 100 mg by mouth as needed for erectile dysfunction.   Spiriva HandiHaler 18 MCG Caps Generic drug: Tiotropium Bromide Irrigate with 1 capsule as directed daily.   tamsulosin  0.4 MG Caps capsule Commonly known as: FLOMAX  Take 0.4 mg by mouth in the morning and at bedtime.   tiotropium 18 MCG inhalation capsule Commonly known as: SPIRIVA Place  18 mcg into inhaler and inhale daily.        Allergies: No Known Allergies  Family History: Family History  Problem Relation Age of Onset   Alzheimer's disease Mother    Aortic aneurysm Father    Kidney disease Neg Hx    Prostate cancer Neg Hx     Social History:  reports that he has been smoking cigarettes. He has a 60 pack-year smoking history. He has never used smokeless tobacco. He reports current alcohol use. He reports that he does not use drugs.   Physical Exam: BP 127/80   Pulse 74   Ht 5' 7 (1.702 m)   Wt 165 lb (74.8 kg)   BMI 25.84 kg/m   Constitutional:  Alert, No acute distress. HEENT: [place] AT Respiratory: Normal respiratory effort, no increased work of breathing. Psychiatric: Normal mood and affect.   Assessment & Plan:    1. Prostate cancer (HCC) (Primary) Undetectable PSA s/p IMRT + ADT.  He has radiation oncology follow-up next month  and PSA will be drawn at that time Currently being followed every 6 month by radiation oncology.  He will follow-up here for Eligard  and will pick up PSA monitoring after discharge by radiation oncology   Glendia JAYSON Barba, MD  [facility] [address], [address] [place], [place] [address] [phone]

## 2024-09-04 ENCOUNTER — Other Ambulatory Visit (HOSPITAL_COMMUNITY): Payer: Self-pay | Admitting: Internal Medicine

## 2024-09-04 ENCOUNTER — Inpatient Hospital Stay (HOSPITAL_COMMUNITY): Admission: RE | Admit: 2024-09-04 | Discharge: 2024-09-04 | Attending: Cardiology

## 2024-09-04 ENCOUNTER — Encounter: Payer: Self-pay | Admitting: Cardiology

## 2024-09-04 ENCOUNTER — Ambulatory Visit (HOSPITAL_COMMUNITY)
Admission: RE | Admit: 2024-09-04 | Discharge: 2024-09-04 | Disposition: A | Discharge status: Home / Self Care | Source: Ambulatory Visit | Attending: Internal Medicine | Admitting: Internal Medicine

## 2024-09-04 DIAGNOSIS — Z01810 Encounter for preprocedural cardiovascular examination: Secondary | ICD-10-CM | POA: Diagnosis present

## 2024-09-04 DIAGNOSIS — I429 Cardiomyopathy, unspecified: Secondary | ICD-10-CM | POA: Insufficient documentation

## 2024-09-04 DIAGNOSIS — I5022 Chronic systolic (congestive) heart failure: Secondary | ICD-10-CM | POA: Insufficient documentation

## 2024-09-04 MED ORDER — REGADENOSON 0.4 MG/5ML IV SOLN
INTRAVENOUS | Status: AC
Start: 2024-09-04 — End: 2024-09-04
  Filled 2024-09-04: qty 5

## 2024-09-04 MED ORDER — ALBUTEROL SULFATE HFA 108 (90 BASE) MCG/ACT IN AERS
INHALATION_SPRAY | RESPIRATORY_TRACT | Status: AC
  Filled 2024-09-04: qty 6.7

## 2024-09-04 MED ORDER — GADOBUTROL 1 MMOL/ML IV SOLN
10.0000 mL | Freq: Once | INTRAVENOUS | Status: AC | PRN
  Administered 2024-09-04: 10 mL via INTRAVENOUS

## 2024-09-04 MED ORDER — NITROGLYCERIN 0.4 MG SL SUBL
SUBLINGUAL_TABLET | SUBLINGUAL | Status: AC
  Filled 2024-09-04: qty 1

## 2024-09-04 NOTE — Progress Notes (Signed)
 Patient presents for stress MRI.  BP 99/66, HR 72.  No caffeine intake in prior 12 hours. No wheezing on exam.  EKG today shows NSR, rate 72, LBBB   Shared Decision Making/Informed Consent The risks [chest pain, shortness of breath, cardiac arrhythmias, dizziness, blood pressure fluctuations, myocardial infarction, stroke/transient ischemic attack, nausea, vomiting, allergic reaction, and life-threatening complications (estimated to be 1 in 10,000)], benefits (risk stratification, diagnosing coronary artery disease, treatment guidance) and alternatives of a MRI stress test were discussed in detail with patient and they agree to proceed.

## 2024-09-05 ENCOUNTER — Ambulatory Visit: Admitting: Urology

## 2024-09-06 ENCOUNTER — Encounter: Payer: Self-pay | Admitting: Urology

## 2024-09-12 ENCOUNTER — Other Ambulatory Visit: Payer: Self-pay | Admitting: *Deleted

## 2024-09-12 DIAGNOSIS — C61 Malignant neoplasm of prostate: Secondary | ICD-10-CM

## 2024-09-17 ENCOUNTER — Inpatient Hospital Stay: Attending: Radiation Oncology

## 2024-09-17 ENCOUNTER — Encounter

## 2024-09-17 DIAGNOSIS — C61 Malignant neoplasm of prostate: Secondary | ICD-10-CM | POA: Insufficient documentation

## 2024-09-17 DIAGNOSIS — Z79899 Other long term (current) drug therapy: Secondary | ICD-10-CM | POA: Insufficient documentation

## 2024-09-17 LAB — PSA: Prostatic Specific Antigen: <0.02 ng/mL (ref 0.00–4.00)

## 2024-09-24 ENCOUNTER — Encounter: Payer: Self-pay | Admitting: Radiation Oncology

## 2024-09-24 ENCOUNTER — Encounter

## 2024-09-24 ENCOUNTER — Ambulatory Visit
Admission: RE | Admit: 2024-09-24 | Discharge: 2024-09-24 | Disposition: A | Discharge status: Home / Self Care | Source: Ambulatory Visit | Attending: Radiation Oncology | Admitting: Radiation Oncology

## 2024-09-24 ENCOUNTER — Other Ambulatory Visit: Payer: Self-pay | Admitting: *Deleted

## 2024-09-24 VITALS — BP 109/75 | HR 76 | Temp 98.0°F | Resp 20 | Wt 164.0 lb

## 2024-09-24 DIAGNOSIS — Z923 Personal history of irradiation: Secondary | ICD-10-CM | POA: Insufficient documentation

## 2024-09-24 DIAGNOSIS — C61 Malignant neoplasm of prostate: Secondary | ICD-10-CM | POA: Diagnosis present

## 2024-09-24 NOTE — Progress Notes (Signed)
 Radiation Oncology Follow up Note  Name: Mathew Reyes   Date:   09/24/2024 MRN:  [id number] DOB: [birth year]    This 83 y.o. male presents to the clinic today for 51-month follow-up status post image guided IMRT radiation therapy to his prostate and pelvic nodes for Gleason 9 (4+5) adenocarcinoma presented with a PSA of 9.7.  His initial stage was 3C.  REFERRING PROVIDER: Auston Reyes BIRCH, MD  HPI: Patient is a 83 year old male now out 16 months having completed image guided IMRT radiation therapy for Gleason 9 adenocarcinoma the prostate seen today in routine follow-up he is doing well.  He specifically denies any increased lower urinary tract symptoms diarrhea or fatigue.  His most recent PSA is.  Less than 0.02.  He is currently on Eligard  I believe has received his third injection.  COMPLICATIONS OF TREATMENT: none  FOLLOW UP COMPLIANCE: keeps appointments   PHYSICAL EXAM:  BP 109/75   Pulse 76   Temp 98 F (36.7 C) (Tympanic)   Resp 20   Wt 164 lb (74.4 kg)   BMI 25.69 kg/m  Well-developed well-nourished patient in NAD. HEENT reveals PERLA, EOMI, discs not visualized.  Oral cavity is clear. No oral mucosal lesions are identified. Neck is clear without evidence of cervical or supraclavicular adenopathy. Lungs are clear to A&P. Cardiac examination is essentially unremarkable with regular rate and rhythm without murmur rub or thrill. Abdomen is benign with no organomegaly or masses noted. Motor sensory and DTR levels are equal and symmetric in the upper and lower extremities. Cranial nerves II through XII are grossly intact. Proprioception is intact. No peripheral adenopathy or edema is identified. No motor or sensory levels are noted. Crude visual fields are within normal range.  RADIOLOGY RESULTS: No current films for review  PLAN: Present time patient is under excellent biochemical control of his prostate cancer.  I am pleased with his overall progress.  I have asked to see  him back in 1 year for follow-up.  He continues close follow-up care with urology and is currently under treatment ADT with Eligard .  Patient knows to call with any concerns at any time.  I would like to take this opportunity to thank you for allowing me to participate in the care of your patient.SABRA Marcey Penton, MD

## 2024-10-09 ENCOUNTER — Ambulatory Visit (HOSPITAL_COMMUNITY)
Admission: RE | Admit: 2024-10-09 | Discharge: 2024-10-09 | Disposition: A | Discharge status: Home / Self Care | Source: Ambulatory Visit | Attending: Cardiology | Admitting: Cardiology

## 2024-10-09 DIAGNOSIS — I7121 Aneurysm of the ascending aorta, without rupture: Secondary | ICD-10-CM | POA: Insufficient documentation

## 2024-10-16 ENCOUNTER — Ambulatory Visit

## 2024-10-16 VITALS — BP 106/62 | HR 61 | Resp 20 | Ht 67.0 in | Wt 169.0 lb

## 2024-10-16 DIAGNOSIS — I7121 Aneurysm of the ascending aorta, without rupture: Secondary | ICD-10-CM | POA: Diagnosis not present

## 2024-10-16 NOTE — Progress Notes (Signed)
 [address] [place] [address]             [phone]            [name] [id number] 10-12-1941   History of Present Illness:  Mathew Reyes is a 83 year old man with medical history of congestive heart failure, hypertension, hypertrophic cardiomyopathy, COPD, osteoarthritis, prostate cancer, BPH and hyperlipidemia who presents for continued follow-up of ascending thoracic aortic aneurysm.  Aneurysm has slightly increased over the past 6 months.  Aneurysm has been measuring 4.6 cm - 4.8 cm and on recent CT scan of chest without contrast measured 5.0 cm. Echocardiogram on 10/2015 showed trileaflet aortic valve.    He presents to the clinic today with his wife and reports that he is doing well.  His blood pressure is well-controlled with current medication therapy.  He is currently being followed by cardiology and plans to undergo defibrillator implantation in the next couple months.  He still smokes 1 pack of cigarettes per day.  He is not ready to quit at this time.  He still is very active with his job at [employer].  He does endorse lifting heavier auto parts and does try to ask for help. He does experience shortness of breath on exertion which is at baseline due to his COPD.  He denies chest pain, shortness of breath at rest and lower leg swelling.    Current Outpatient Medications on File Prior to Visit  Medication Sig Dispense Refill   aspirin  EC 81 MG tablet Take 81 mg by mouth daily. Swallow whole.     budesonide -formoterol  (SYMBICORT ) 160-4.5 MCG/ACT inhaler Inhale 2 puffs into the lungs 2 (two) times daily.     calcipotriene-betamethasone (TACLONEX) ointment Apply 1 application topically in the morning and at bedtime.     calcium  carbonate (OSCAL) 1500 (600 Ca) MG TABS tablet Take 600 mg of elemental calcium  by mouth daily with breakfast.     carvedilol  (COREG ) 6.25 MG tablet Take 3.125 mg by mouth 2 (two) times daily.      dapagliflozin propanediol (FARXIGA) 10 MG TABS tablet Take 10 mg by mouth daily.     digoxin  (LANOXIN ) 0.125 MG tablet Take 0.125 mg by mouth daily.   0   doxazosin (CARDURA) 2 MG tablet Take 2 mg by mouth daily.     ENTRESTO  24-26 MG Take 1 tablet by mouth daily.     finasteride  (PROSCAR ) 5 MG tablet Take 1 tablet (5 mg total) by mouth daily. 90 tablet 4   FLUoxetine  (PROZAC ) 20 MG capsule Take 20 mg by mouth daily.     KERENDIA 20 MG TABS Take 1 tablet by mouth daily.     levalbuterol  (XOPENEX  HFA) 45 MCG/ACT inhaler Inhale 1-2 puffs into the lungs in the morning and at bedtime.     montelukast  (SINGULAIR ) 10 MG tablet Take 10 mg by mouth at bedtime.      Multiple Vitamin (MULTIVITAMIN WITH MINERALS) TABS tablet Take 1 tablet by mouth daily.     Omega-3 Fatty Acids (FISH OIL) 1000 MG CAPS Take 1,000 mg by mouth daily.     omeprazole (PRILOSEC) 20 MG capsule Take 20 mg by mouth daily.      rosuvastatin (CRESTOR) 10 MG tablet Take 10 mg by mouth at bedtime.     sildenafil (VIAGRA) 100 MG tablet Take 100 mg by mouth as needed for erectile dysfunction.  SPIRIVA HANDIHALER 18 MCG CAPS Irrigate with 1 capsule as directed daily.     tamsulosin  (FLOMAX ) 0.4 MG CAPS capsule Take 0.4 mg by mouth in the morning and at bedtime.     tiotropium (SPIRIVA) 18 MCG inhalation capsule Place 18 mcg into inhaler and inhale daily.     vitamin C  (ASCORBIC ACID ) 500 MG tablet Take 500 mg by mouth daily.     [DISCONTINUED] enoxaparin  (LOVENOX ) 40 MG/0.4ML injection Inject 0.4 mLs (40 mg total) into the skin daily for 14 days. 5.6 mL 0   Current Facility-Administered Medications on File Prior to Visit  Medication Dose Route Frequency Provider Last Rate Last Admin   leuprolide  (6 Month) (ELIGARD ) injection 45 mg  45 mg Subcutaneous Once Vaillancourt, Samantha, PA-C         ROS: Review of Systems  Constitutional:  Negative for malaise/fatigue.  Respiratory:  Positive for shortness of breath. Negative for  cough.        On exertion  Cardiovascular:  Negative for chest pain, palpitations and leg swelling.     BP 106/62   Pulse 61   Resp 20   Ht 5' 7 (1.702 m)   Wt 169 lb (76.7 kg)   SpO2 90% Comment: RA  BMI 26.47 kg/m   Physical Exam Constitutional:      Appearance: Normal appearance.  HENT:     Head: Normocephalic and atraumatic.  Skin:    General: Skin is warm and dry.  Neurological:     General: No focal deficit present.     Mental Status: He is alert and oriented to person, place, and time.      Imaging: EXAM: CT CHEST WITHOUT CONTRAST 10/09/2024 11:19:21 AM   TECHNIQUE: CT of the chest was performed without the administration of intravenous contrast. Multiplanar reformatted images are provided for review. Automated exposure control, iterative reconstruction, and/or weight based adjustment of the mA/kV was utilized to reduce the radiation dose to as low as reasonably achievable.   COMPARISON: 12/20/2023.   CLINICAL HISTORY: Aortic aneurysm suspected.   FINDINGS:   MEDIASTINUM: Heart and pericardium are unremarkable. Coronary artery calcifications are noted. The central airways are clear. Tortuosity of the descending thoracic aorta is noted. Maximum measured diameter of the ascending thoracic aorta is 5.0 cm, which is slightly increased compared to the prior exam.   LYMPH NODES: No mediastinal, hilar or axillary lymphadenopathy.   LUNGS AND PLEURA: No focal consolidation or pulmonary edema. No pleural effusion or pneumothorax.   SOFT TISSUES/BONES: No acute abnormality of the bones or soft tissues.   UPPER ABDOMEN: Limited images of the upper abdomen demonstrates no acute abnormality.   IMPRESSION: 1. Slightly increased maximum measured diameter of the ascending thoracic aortic aneurysm at 5.0 cm compared to prior exam. Follow up chest CT in 6 months is recommended .   Electronically signed by: Lynwood Seip MD 10/09/2024 11:57 AM EST  RP Workstation: [id number]     A/P: Aneurysm of ascending aorta without rupture -5.0 cm ascending thoracic aortic aneurysm on CT chest without contrast.  This is slightly increased from the previous CTA of chest which measured aneurysm at 4.8 cm. Echocardiogram showed normal structure aortic valve.  -We discussed the natural history and and risk factors for growth of ascending aortic aneurysms. Discussed recommendations to minimize the risk of further expansion or dissection including careful blood pressure control, avoidance of contact sports and heavy lifting, attention to lipid management.  We covered the importance of smoking cessation.  The  patient does not yet meet surgical criteria of >5.5cm. The patient is aware of signs and symptoms of aortic dissection and when to present to the emergency department   -Follow up in 6 months with CTA of chest for continued surveillance    Risk Modification:  Statin:  rosuvastatin   Smoking cessation instruction/counseling given:  counseled patient on the dangers of tobacco use, advised patient to stop smoking, and reviewed strategies to maximize success  Patient was counseled on importance of Blood Pressure Control  They are instructed to contact their Primary Care Physician if they start to have blood pressure readings over 130s/90s. Do not ever stop blood pressure medications on your own, unless instructed by healthcare professional.  Please avoid use of Fluoroquinolones as this can potentially increase your risk of Aortic Rupture and/or Dissection  Patient educated on signs and symptoms of Aortic Dissection, handout also provided in AVS  Manuelita CHRISTELLA Rough, PA-C 10/16/24

## 2024-10-16 NOTE — Patient Instructions (Signed)
 Risk Modification in those with ascending thoracic aortic aneurysm:   Continue control of blood pressure (prefer BP 130/80 or less)   2. Avoid fluoroquinolone antibiotics (I.e Ciprofloxacin, Avelox, Levofloxacin, Ofloxacin)   3.  Use of statin (to decrease cardiovascular risk)   4.  Exercise and activity limitations is individualized, but in general, contact sports are to be avoided and one should avoid heavy lifting (defined as half of ideal body weight) and exercises involving sustained Valsalva maneuver.   5.  Follow-up in 6 months with CTA chest.  OK to use a non-contrast CT if you have had a recent study for surveillance of the lung nodule.

## 2024-11-26 NOTE — Progress Notes (Unsigned)
 " Electrophysiology Office Note:   Date:  11/28/2024  ID:  Mathew Reyes, DOB 29-Oct-1941, MRN [id number]  Primary Cardiologist: None Electrophysiologist: Fonda Kitty, MD      History of Present Illness:   Mathew Reyes is a 84 y.o. male with h/o nonfamilial nonobstructive hypertrophic cardiomyopathy (MWT 1.9 cm and no LGE), hypertension, chronic left ventricular systolic dysfunction (LVEF 40%), LBBB, coronary artery disease with 75% mid LAD lesion treated medically, ascending aortic aneurysm 4.7 cm, COPD, tobacco use who is being seen today for evaluation for CRT-D at the request of Dr. Prentice Flatten.  Discussed the use of AI scribe software for clinical note transcription with the patient, who gave verbal consent to proceed.  History of Present Illness Mathew Reyes is an 84 year old male with reduced heart function and left bundle branch block who presents for evaluation of cardiac resynchronization therapy (CRT). He was referred by Dr. Flatten for consideration of a CRT-D.  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 No dyspnea at rest, orthopnea, or paroxysmal nocturnal dyspnea. He does not require sleeping propped up or in a recliner.  He had an echocardiogram, which showed an ejection fraction of 40%. He was told he has a left bundle branch block. He had a cardiac MRI, which did not show any scar tissue. He previously underwent cardiac catheterization, but a stent was not placed.  He denies peripheral edema.  He works delivering research scientist (life sciences) parts, which involves lifting heavy objects.   Review of systems complete and found to be negative unless listed in HPI.   EP Information / Studies Reviewed:    EKG is not ordered today. EKG from 09/04/24 reviewed which showed SR with LBBB, QRS .      Cardiac MRI 09/04/24:  IMPRESSION: 1. Stress MRI: first pass perfusion images could not be  interpreted due to gating abnormalities. Stress test is non-diagnostic. Normal resting first pass perfusion.   2. Mildly reduced LV systolic function, LVEF 43% with normal LV chamber size. Severe asymmetric LVH with basal anteroseptal thickness of at least 19 mm. There is systolic anterior motion of the mitral valve. 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 Mitral regurgitation is seen. Findings consistent with hypertrophic cardiomyopathy, sigmoid subtype with obstruction. No delayed myocardial enhancement. Alternatively given patient age, may represent significant aortic angulation in setting of LVH.   3. No evidence of scar, fibrosis, inflammation, infarction or infiltrative process.   4. Mildly reduced RV systolic function with normal RV chamber size, RVEF 46%.   5.  Mild dilation of ascending aorta, 44 mm mid ascending aorta.  LHC 07/13/24:   Prox LAD lesion is 75% stenosed with 75% stenosed side branch in 1st Sept.   Prox RCA lesion is 25% stenosed.   There is moderate left ventricular systolic dysfunction.   The left ventricular ejection fraction is 35-45% by visual estimate.   Recommend uninterrupted dual antiplatelet therapy with Aspirin  81mg  daily.  Echo 03/13/24:  CONCLUSION -------------------------------------------------------------------------------  MILD LEFT VENTRICULAR SYSTOLIC DYSFUNCTION WITH MODERATE LVH  ESTIMATED EF: 40%, CALC EF(2D): 40%  NORMAL LA PRESSURES WITH DIASTOLIC DYSFUNCTION (GRADE 1)  NORMAL RIGHT VENTRICULAR SYSTOLIC FUNCTION  VALVULAR REGURGITATION: TRIVIAL AR, MILD MR, TRIVIAL PR, MILD TR  NO VALVULAR STENOSIS   Physical Exam:   VS:  BP 110/68   Pulse 66   Ht 5' 7 (1.702 m)   Wt 169 lb 12.8 oz (77 kg)  SpO2 93%   BMI 26.59 kg/m    Wt Readings from Last 3 Encounters:  11/27/24 169 lb 12.8 oz (77 kg)  10/16/24 169 lb (76.7 kg)  09/24/24 164 lb (74.4 kg)     General: Well developed, in no acute distress.   Neck: No JVD.  Cardiac: Normal rate, regular rhythm.  Resp: Normal work of breathing.  Ext: No edema.  Neuro: No gross focal deficits.  Psych: Normal affect.    ASSESSMENT AND PLAN:    #Chronic systolic heart failure: LVEF 40% on echo. NYHA class II symptoms.  #Hypertrophic cardiomyopathy #LBBB - Patient referred for CRT-D evaluation. He has LBBB and low LVEF, making him an appropriate candidate for CRT, although in setting of hypertrophic cardiomyopathy the ata is not as strong with regards to clinical improvement. We discussed CRT-P vs CRT-D. He does have hypertrophic cardiomyopathy, but at 40 with no scar on cardiac MRI or other high risk features, I am not sure the benefits of ICD outweigh the added risk. Patient tends to favor CRT-P over CRT-D after discussion of his risk of SCD. We will tentatively plan for CRT-P but I will reach out to his primary cardiologists - Dr. Florencio and Dr. Cesario to finalize the plan.  -Explained risks, benefits, and alternatives to CRT implantation, including but not limited to bleeding, infection, damage to heart or lungs, heart attack, stroke, or death.  Pt verbalized understanding and elected to proceed.  - Continue GDMT regimen of carvedilol , dapagliflozin, digozin, Entresto .   Follow up with EP Team as usual post procedure  Signed, Fonda Kitty, MD  "

## 2024-11-27 ENCOUNTER — Encounter: Payer: Self-pay | Admitting: Cardiology

## 2024-11-27 ENCOUNTER — Ambulatory Visit: Admitting: Cardiology

## 2024-11-27 VITALS — BP 110/68 | HR 66 | Ht 67.0 in | Wt 169.8 lb

## 2024-11-27 DIAGNOSIS — I447 Left bundle-branch block, unspecified: Secondary | ICD-10-CM | POA: Diagnosis not present

## 2024-11-27 DIAGNOSIS — I422 Other hypertrophic cardiomyopathy: Secondary | ICD-10-CM | POA: Diagnosis not present

## 2024-11-27 DIAGNOSIS — I5022 Chronic systolic (congestive) heart failure: Secondary | ICD-10-CM | POA: Diagnosis not present

## 2024-11-27 DIAGNOSIS — I429 Cardiomyopathy, unspecified: Secondary | ICD-10-CM

## 2024-11-27 NOTE — Patient Instructions (Signed)
 Medication Instructions:  Your physician recommends that you continue on your current medications as directed. Please refer to the Current Medication list given to you today.  *If you need a refill on your cardiac medications before your next appointment, please call your pharmacy*  Lab Work: BMET and CBC - you will need to have these drawn at any [facility] location about two weeks prior to your procedure.   Testing/Procedures: Pacemaker Implant  Your physician has recommended that you have a pacemaker inserted. A pacemaker is a small device that is placed under the skin of your chest or abdomen to help control abnormal heart rhythms. This device uses electrical pulses to prompt the heart to beat at a normal rate. Pacemakers are used to treat heart rhythms that are too slow. Wire (leads) are attached to the pacemaker that goes into the chambers of you heart. This is done in the hospital and usually requires and overnight stay.   You are scheduled for a Pacemaker Implant on Monday, March 16 with Dr. Sidra Kitty. Please arrive at 10:30am at [facility] Entrance [address], [place], [place], [address].    Follow-Up: At [facility], you and your health needs are our priority.  As part of our continuing mission to provide you with exceptional heart care, our providers are all part of one team.  This team includes your primary Cardiologist (physician) and Advanced Practice Providers or APPs (Physician Assistants and Nurse Practitioners) who all work together to provide you with the care you need, when you need it.  Your next appointment:   We will contact you to schedule your post-procedure appointments - 10 days/90 days after your procedure

## 2024-11-28 ENCOUNTER — Telehealth: Payer: Self-pay | Admitting: Cardiology

## 2024-11-28 NOTE — Telephone Encounter (Signed)
 Patient's significant other (LaLa) called and would a call back from Dr Kennyth nurse please advise

## 2024-11-28 NOTE — Telephone Encounter (Signed)
 Left message to call back

## 2024-12-05 NOTE — Telephone Encounter (Signed)
 Left message to call back

## 2024-12-05 NOTE — Telephone Encounter (Signed)
 Spoke with the patient's spouse. She does not have any questions at this time. Confirmed patient's upcoming procedure date and time. Advised instructions will be mailed out.

## 2024-12-05 NOTE — Telephone Encounter (Signed)
 Pt returned call and would like a call back

## 2024-12-06 ENCOUNTER — Telehealth: Payer: Self-pay

## 2024-12-06 NOTE — Telephone Encounter (Signed)
-----   Message from Nurse Doreatha BROCKS, RN sent at 11/28/2024  2:01 PM EST ----- Regarding: 3/16 CRT-P implant [facility] Precert:  MD: Kennyth Type of implant: CRT-P Device manufacturer: Medtronic Diagnosis: Bradycardia CPT code: CRT-P implant - 66791+66774 C-code(s), including quantity (if indicated):  Procedure scheduled (date/time): 3/16 at 1230pm  Procedure:  Scrub given? Yes  Medication instructions: can take morning meds Message sent to CVRR? No Added to calendar? Yes Orders entered? Yes Letter complete? No, >30 days before procedure Scheduled with cath lab? Yes Labs ordered (CBC, BMET, PT/INR if on warfarin)? Yes Dye allergy? No Pre-meds ordered and instructions given? No, not needed Letter method: Mailed Special instructions: n/a H&P: 1/20  Follow-up:  Cassie/Angel, please schedule Routine.  Covering RN:  Please send this message to [employer], EP scheduler, EP Scheduling pool, and EP [employer].

## 2025-01-21 ENCOUNTER — Ambulatory Visit: Admit: 2025-01-21 | Admitting: Cardiology

## 2025-01-21 DIAGNOSIS — I429 Cardiomyopathy, unspecified: Secondary | ICD-10-CM

## 2025-01-21 SURGERY — BIV PACEMAKER INSERTION CRT-P
Anesthesia: Moderate Sedation

## 2025-09-18 ENCOUNTER — Inpatient Hospital Stay

## 2025-09-25 ENCOUNTER — Ambulatory Visit: Admitting: Radiation Oncology
